# Patient Record
Sex: Male | Born: 1960
Health system: Southern US, Community
[De-identification: ages and names within clinical notes are randomized; demographics above are authoritative.]

## PROBLEM LIST (undated history)

## (undated) DIAGNOSIS — J45909 Unspecified asthma, uncomplicated: Secondary | ICD-10-CM

## (undated) DIAGNOSIS — N289 Disorder of kidney and ureter, unspecified: Secondary | ICD-10-CM

## (undated) DIAGNOSIS — J342 Deviated nasal septum: Secondary | ICD-10-CM

## (undated) DIAGNOSIS — E7800 Pure hypercholesterolemia, unspecified: Secondary | ICD-10-CM

## (undated) DIAGNOSIS — E079 Disorder of thyroid, unspecified: Secondary | ICD-10-CM

## (undated) DIAGNOSIS — C649 Malignant neoplasm of unspecified kidney, except renal pelvis: Secondary | ICD-10-CM

## (undated) DIAGNOSIS — G43909 Migraine, unspecified, not intractable, without status migrainosus: Secondary | ICD-10-CM

## (undated) DIAGNOSIS — K760 Fatty (change of) liver, not elsewhere classified: Secondary | ICD-10-CM

## (undated) DIAGNOSIS — B159 Hepatitis A without hepatic coma: Secondary | ICD-10-CM

## (undated) DIAGNOSIS — M797 Fibromyalgia: Secondary | ICD-10-CM

## (undated) DIAGNOSIS — S32000A Wedge compression fracture of unspecified lumbar vertebra, initial encounter for closed fracture: Secondary | ICD-10-CM

## (undated) DIAGNOSIS — F32A Depression, unspecified: Secondary | ICD-10-CM

## (undated) DIAGNOSIS — G4730 Sleep apnea, unspecified: Secondary | ICD-10-CM

## (undated) DIAGNOSIS — R0981 Nasal congestion: Secondary | ICD-10-CM

## (undated) DIAGNOSIS — G5600 Carpal tunnel syndrome, unspecified upper limb: Secondary | ICD-10-CM

## (undated) DIAGNOSIS — E119 Type 2 diabetes mellitus without complications: Secondary | ICD-10-CM

## (undated) DIAGNOSIS — F329 Major depressive disorder, single episode, unspecified: Secondary | ICD-10-CM

## (undated) DIAGNOSIS — E669 Obesity, unspecified: Secondary | ICD-10-CM

## (undated) DIAGNOSIS — Z85528 Personal history of other malignant neoplasm of kidney: Secondary | ICD-10-CM

## (undated) DIAGNOSIS — M1990 Unspecified osteoarthritis, unspecified site: Secondary | ICD-10-CM

## (undated) DIAGNOSIS — I10 Essential (primary) hypertension: Secondary | ICD-10-CM

## (undated) HISTORY — DX: Disorder of thyroid, unspecified: E07.9

## (undated) HISTORY — DX: Nasal congestion: R09.81

## (undated) HISTORY — DX: Depression, unspecified: F32.A

## (undated) HISTORY — DX: Disorder of kidney and ureter, unspecified: N28.9

## (undated) HISTORY — DX: Wedge compression fracture of unspecified lumbar vertebra, initial encounter for closed fracture: S32.000A

## (undated) HISTORY — DX: Deviated nasal septum: J34.2

## (undated) HISTORY — DX: Obesity, unspecified: E66.9

## (undated) HISTORY — DX: Migraine, unspecified, not intractable, without status migrainosus: G43.909

## (undated) HISTORY — DX: Type 2 diabetes mellitus without complications: E11.9

## (undated) HISTORY — PX: KNEE SURGERY: SHX244

## (undated) HISTORY — DX: Unspecified asthma, uncomplicated: J45.909

## (undated) HISTORY — DX: Morbid (severe) obesity due to excess calories: E66.01

## (undated) HISTORY — DX: Sleep apnea, unspecified: G47.30

## (undated) HISTORY — DX: Major depressive disorder, single episode, unspecified: F32.9

## (undated) HISTORY — DX: Pure hypercholesterolemia, unspecified: E78.00

## (undated) HISTORY — DX: Fatty (change of) liver, not elsewhere classified: K76.0

## (undated) HISTORY — PX: ANKLE SURGERY: SHX546

## (undated) HISTORY — DX: Fibromyalgia: M79.7

## (undated) HISTORY — PX: ROTATOR CUFF REPAIR: SHX139

## (undated) HISTORY — DX: Carpal tunnel syndrome, unspecified upper limb: G56.00

## (undated) HISTORY — PX: HAND SURGERY: SHX662

## (undated) HISTORY — DX: Personal history of other malignant neoplasm of kidney: Z85.528

## (undated) HISTORY — DX: Unspecified osteoarthritis, unspecified site: M19.90

## (undated) HISTORY — DX: Malignant neoplasm of unspecified kidney, except renal pelvis: C64.9

## (undated) HISTORY — DX: Hepatitis a without hepatic coma: B15.9

## (undated) HISTORY — DX: Essential (primary) hypertension: I10

---
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
## 2001-01-23 ENCOUNTER — Ambulatory Visit (HOSPITAL_BASED_OUTPATIENT_CLINIC_OR_DEPARTMENT_OTHER): Admission: RE | Admit: 2001-01-23 | Discharge: 2001-01-23 | Payer: Self-pay | Admitting: *Deleted

## 2001-02-27 ENCOUNTER — Ambulatory Visit (HOSPITAL_BASED_OUTPATIENT_CLINIC_OR_DEPARTMENT_OTHER): Admission: RE | Admit: 2001-02-27 | Discharge: 2001-02-27 | Payer: Self-pay | Admitting: Internal Medicine

## 2001-12-28 HISTORY — PX: NEPHRECTOMY: SHX65

## 2002-01-14 ENCOUNTER — Emergency Department (HOSPITAL_COMMUNITY): Admission: EM | Admit: 2002-01-14 | Discharge: 2002-01-14 | Payer: Self-pay | Admitting: Emergency Medicine

## 2002-01-14 ENCOUNTER — Encounter: Payer: Self-pay | Admitting: Emergency Medicine

## 2002-01-31 ENCOUNTER — Ambulatory Visit (HOSPITAL_BASED_OUTPATIENT_CLINIC_OR_DEPARTMENT_OTHER): Admission: RE | Admit: 2002-01-31 | Discharge: 2002-01-31 | Payer: Self-pay | Admitting: Orthopedic Surgery

## 2002-10-11 ENCOUNTER — Encounter: Payer: Self-pay | Admitting: *Deleted

## 2002-10-11 ENCOUNTER — Ambulatory Visit (HOSPITAL_COMMUNITY): Admission: RE | Admit: 2002-10-11 | Discharge: 2002-10-11 | Payer: Self-pay | Admitting: *Deleted

## 2002-10-13 ENCOUNTER — Encounter: Payer: Self-pay | Admitting: *Deleted

## 2002-10-13 ENCOUNTER — Ambulatory Visit (HOSPITAL_COMMUNITY): Admission: RE | Admit: 2002-10-13 | Discharge: 2002-10-13 | Payer: Self-pay | Admitting: *Deleted

## 2002-10-17 ENCOUNTER — Ambulatory Visit (HOSPITAL_COMMUNITY): Admission: RE | Admit: 2002-10-17 | Discharge: 2002-10-17 | Payer: Self-pay | Admitting: Urology

## 2002-10-17 ENCOUNTER — Encounter: Payer: Self-pay | Admitting: Urology

## 2002-10-30 ENCOUNTER — Inpatient Hospital Stay (HOSPITAL_COMMUNITY): Admission: RE | Admit: 2002-10-30 | Discharge: 2002-11-06 | Payer: Self-pay | Admitting: Urology

## 2002-11-01 ENCOUNTER — Encounter: Payer: Self-pay | Admitting: Urology

## 2002-11-02 ENCOUNTER — Encounter: Payer: Self-pay | Admitting: Urology

## 2002-11-03 ENCOUNTER — Encounter: Payer: Self-pay | Admitting: Urology

## 2002-11-04 ENCOUNTER — Encounter: Payer: Self-pay | Admitting: Urology

## 2002-11-05 ENCOUNTER — Encounter: Payer: Self-pay | Admitting: Urology

## 2002-11-06 ENCOUNTER — Encounter: Payer: Self-pay | Admitting: Urology

## 2003-02-15 ENCOUNTER — Ambulatory Visit (HOSPITAL_COMMUNITY): Admission: RE | Admit: 2003-02-15 | Discharge: 2003-02-15 | Payer: Self-pay | Admitting: Family Medicine

## 2003-02-15 ENCOUNTER — Encounter: Payer: Self-pay | Admitting: Family Medicine

## 2003-12-14 ENCOUNTER — Ambulatory Visit (HOSPITAL_COMMUNITY): Admission: RE | Admit: 2003-12-14 | Discharge: 2003-12-14 | Payer: Self-pay | Admitting: Urology

## 2005-09-30 ENCOUNTER — Inpatient Hospital Stay (HOSPITAL_COMMUNITY): Admission: RE | Admit: 2005-09-30 | Discharge: 2005-10-04 | Payer: Self-pay | Admitting: Orthopedic Surgery

## 2005-12-28 HISTORY — PX: BACK SURGERY: SHX140

## 2006-11-24 ENCOUNTER — Inpatient Hospital Stay (HOSPITAL_COMMUNITY): Admission: RE | Admit: 2006-11-24 | Discharge: 2006-11-28 | Payer: Self-pay | Admitting: Neurosurgery

## 2007-01-04 ENCOUNTER — Encounter: Admission: RE | Admit: 2007-01-04 | Discharge: 2007-01-04 | Payer: Self-pay | Admitting: Neurosurgery

## 2007-02-08 ENCOUNTER — Encounter: Admission: RE | Admit: 2007-02-08 | Discharge: 2007-02-08 | Payer: Self-pay | Admitting: Neurosurgery

## 2007-03-10 ENCOUNTER — Encounter: Admission: RE | Admit: 2007-03-10 | Discharge: 2007-03-10 | Payer: Self-pay | Admitting: Neurosurgery

## 2007-04-12 ENCOUNTER — Encounter: Admission: RE | Admit: 2007-04-12 | Discharge: 2007-04-12 | Payer: Self-pay | Admitting: Neurosurgery

## 2007-09-02 ENCOUNTER — Inpatient Hospital Stay (HOSPITAL_COMMUNITY): Admission: RE | Admit: 2007-09-02 | Discharge: 2007-09-04 | Payer: Self-pay | Admitting: Orthopedic Surgery

## 2007-09-02 ENCOUNTER — Encounter (INDEPENDENT_AMBULATORY_CARE_PROVIDER_SITE_OTHER): Payer: Self-pay | Admitting: Orthopedic Surgery

## 2009-10-22 ENCOUNTER — Ambulatory Visit (HOSPITAL_COMMUNITY): Admission: RE | Admit: 2009-10-22 | Discharge: 2009-10-22 | Payer: Self-pay | Admitting: Rheumatology

## 2009-11-25 ENCOUNTER — Encounter: Admission: RE | Admit: 2009-11-25 | Discharge: 2009-11-25 | Payer: Self-pay | Admitting: Neurology

## 2009-12-03 ENCOUNTER — Encounter: Payer: Self-pay | Admitting: Interventional Radiology

## 2009-12-18 ENCOUNTER — Ambulatory Visit (HOSPITAL_COMMUNITY): Admission: RE | Admit: 2009-12-18 | Discharge: 2009-12-18 | Payer: Self-pay | Admitting: Interventional Radiology

## 2010-11-10 ENCOUNTER — Ambulatory Visit (HOSPITAL_COMMUNITY): Admission: RE | Admit: 2010-11-10 | Discharge: 2010-11-10 | Payer: Self-pay | Admitting: Urology

## 2011-03-10 LAB — SURGICAL PCR SCREEN
MRSA, PCR: NEGATIVE
Staphylococcus aureus: POSITIVE — AB

## 2011-03-10 LAB — COMPREHENSIVE METABOLIC PANEL
Albumin: 4.2 g/dL (ref 3.5–5.2)
Alkaline Phosphatase: 107 U/L (ref 39–117)
BUN: 16 mg/dL (ref 6–23)
Creatinine, Ser: 1.2 mg/dL (ref 0.4–1.5)
Glucose, Bld: 103 mg/dL — ABNORMAL HIGH (ref 70–99)
Potassium: 4.3 mEq/L (ref 3.5–5.1)
Total Bilirubin: 0.7 mg/dL (ref 0.3–1.2)
Total Protein: 7.6 g/dL (ref 6.0–8.3)

## 2011-03-10 LAB — GLUCOSE, CAPILLARY: Glucose-Capillary: 94 mg/dL (ref 70–99)

## 2011-03-10 LAB — HEMOGLOBIN A1C
Hgb A1c MFr Bld: 7.8 % — ABNORMAL HIGH (ref <5.7)
Mean Plasma Glucose: 177 mg/dL — ABNORMAL HIGH (ref <117)

## 2011-03-30 LAB — BASIC METABOLIC PANEL
CO2: 26 mEq/L (ref 19–32)
Chloride: 103 mEq/L (ref 96–112)
GFR calc Af Amer: >60 mL/min (ref >60)
Glucose, Bld: 147 mg/dL — ABNORMAL HIGH (ref 70–99)
Sodium: 138 mEq/L (ref 135–145)

## 2011-03-30 LAB — GLUCOSE, CAPILLARY: Glucose-Capillary: 162 mg/dL — ABNORMAL HIGH (ref 70–99)

## 2011-03-30 LAB — CBC
Hemoglobin: 15.8 g/dL (ref 13.0–17.0)
MCHC: 33.6 g/dL (ref 30.0–36.0)
MCV: 88 fL (ref 78.0–100.0)
RBC: 5.34 MIL/uL (ref 4.22–5.81)
RDW: 13.8 % (ref 11.5–15.5)

## 2011-05-12 NOTE — Op Note (Signed)
NAMEJAYCE, [name]            ACCOUNT NO.:  [id number]   MEDICAL RECORD NO.:  [id number]          PATIENT TYPE:  INP   LOCATION:  2550                         FACILITY:  [facility]   PHYSICIAN:  Harvie Junior, M.D.   DATE OF BIRTH:  April 22, 1961   DATE OF PROCEDURE:  09/02/2007  DATE OF DISCHARGE:                               OPERATIVE REPORT   PREOPERATIVE DIAGNOSIS:  Painful left total knee for greater than a  year.   POSTOPERATIVE DIAGNOSIS:  Painful left total knee for greater than a  year.   PRINCIPAL PROCEDURE:  1. Radical synovectomy, left knee.  2. Total knee revision by way of tibial component, by way of poly      exchange.   SURGEON:  Harvie Junior, M.D.   ASSISTANT:  Marshia Ly, P.A.   ANESTHESIA:  General.   BRIEF HISTORY:  [name] is a 50 year old male with a long history of  having had significant left knee pain.  He had undergone total knee  replacement and had done reasonably well with that.  He was getting back  into some work, had some bad back trouble with caused him a lot of  problems and ultimately had a couple of back surgeries.  He was still  having some significant left knee pain and ultimately was concerned  about his left knee replacement, and because of continued complaints of  pain, we evaluated thoroughly.  Preoperative workup for infection was  negative.  X-rays looked to show well-fixed componentry, and ultimately  because of continued complaints of pain, we ultimately took him to the  operating room for exploration and revision as needed.   PROCEDURE:  The patient was taken to the operating room.  After adequate  anesthesia was obtained with general anesthetic and the patient was  placed on the operating room table, the left knee was prepped and draped  using sterile fashion.  Following this, the leg was exsanguinated of  blood, and tourniquet was inflated to 350 mmHg.  Following this, the old  incision was exploited, and we went  down into the knee joint.  There was  fair amount of fluid in the knee joint.  This was sent for a Gram stain  and culture.  Once that was completed, the knee was opened carefully.  We gave openings to allow for the synovectomy and carefully protected  the patellar tendon and the quad mechanism.  We did a thorough  synovectomy medially, laterally, up onto the patellofemoral joint, and  then aggressively pursued the synovial proliferation laterally,  posterolaterally, posteromedially and just resected a tremendous amount  of scar tissue out of this knee.  At this point, the tibial component  was revised by way of taking out the tibial polyethylene liner, and then  we basically exposed the tibial component, took all the bone around from  heaped up over the front edge of this and basically took some bone tamp  and tamped on the component.  There was no tendency towards it to be  loosening, and we really went all around this component to make sure  there was  no evidence of loosening.  Femoral component similarly then  went completely around the entire interface of the femoral component and  saw no loosening of the femoral component.  We put the femoral guide on  and sort of rotated and pulled and no tendency towards that to want to  come off.  At this point, the tourniquet was let down.  A thorough  lavage was performed with 3 liters of pulsatile lavage irrigation, and a  trial polyethylene was put in place.  Excellent range of motion was  achieved on the table.  Ultimately, a 10 polyethylene liner was put in  place, and the  parapatellar arthrotomy was closed with 1 Vicryl running suture and then  the skin with 0 and 2-0 Vicryl and skin staples.  A sterile compressive  dressing was applied, and the patient was taken to recovery room.  She  was noted to be in satisfactory condition.  The estimated blood loss for  the procedure was none.      Harvie Junior, M.D.  Electronically  Signed     JLG/MEDQ  D:  09/02/2007  T:  09/02/2007  Job:  [id number]   cc:   Harvie Junior, M.D.

## 2011-05-15 NOTE — Discharge Summary (Signed)
[name], [name]            ACCOUNT NO.:  [id number]   MEDICAL RECORD NO.:  [id number]          PATIENT TYPE:  INP   LOCATION:  5018                         FACILITY:  [facility]   PHYSICIAN:  Harvie Junior, M.D.   DATE OF BIRTH:  [birth year]   DATE OF ADMISSION:  09/02/2007  DATE OF DISCHARGE:  09/04/2007                               DISCHARGE SUMMARY   ADMISSION DIAGNOSES:  1. Painful left knee, status post left total knee replacement on      October 6, with possible aseptic loosening of total knee      components.  2. Diabetes mellitus.  3. Hypertension.  4. Asthma.  5. History of hepatitis A.  6. Hyperlipidemia.   DISCHARGE DIAGNOSES:  1. Painful left knee, status post left total knee replacement on      October 6, with possible aseptic loosening of total knee      components, extensive scar tissue and synovitis.  2. Diabetes mellitus.  3. Hypertension.  4. Asthma.  5. History of hepatitis A.  6. Hyperlipidemia.   PROCEDURES:  1. Radical synovectomy, left knee, by Jodi Geralds, MD, on September 02, 2007.  2. Total knee revision with polyethylene exchange of tibial component.   HISTORY OF PRESENT ILLNESS:  [name] is a 50 year old male who is  well known to [place].  He underwent a left total knee replacement in October  2006.  He did well overall initially.  He had a degenerative arthritis  at the time.  He has been followed along and in the past 6-8 months has  had worsening left knee pain with weightbearing.  Standing x-rays of the  left knee showed no frank loosening of his components, but he has  actually had persistent left knee pain for more than 1 year that did not  respond to exhaustive conservative treatment including medication,  modifications to his activity and therapy.  Based upon his clinical  findings, he was felt to be a candidate for a left knee arthrotomy with  evaluation of this total knee components and surgical intervention.  He  was  admitted for this.   PERTINENT LABORATORY STUDIES:  Hemoglobin on admission was 16.2,  hematocrit 47.1, MCV within normal limits.  On postop day #1, his  hemoglobin was 3.2.  On postop day #2, it was 13.6.  Protime on  admission was 12.9 seconds with an INR of 1.0, PTT 29.  On date of  discharge, his INR was 1.3 on Coumadin therapy.  BMET on admission  showed no abnormalities.  He did elevate his glucose up to 168 on postop  day #2.  Although there are parts of the CMET which were done prior to  admission and were within normal limits, his hemoglobin A1c was 6.9.  Urinalysis showed no abnormalities.  Cultures of the left knee including  wound and tissue cultures, direct smears and anaerobic cultures were all  negative.   HOSPITAL COURSE:  The patient underwent surgery as well described Dr.  Luiz Sanchez' operative note on September 02, 2007.  Postoperatively, he was  started on Coumadin  for DVT prophylaxis.  A CPM machine was used and a  PCA morphine pump was used for pain control.  The patient was also given  IV antibiotics after the cultures were taken and were given x1 day  postop.  Physical therapy evaluated the patient for walker ambulation,  weightbearing as tolerated on the left.  On postop day #1, he had  moderate knee pain and no fever.  He was overall stable.  His hemoglobin  was stable and physical therapy continued to work with him.  The drain  that was placed at the time of surgery was continued.  He was continued  on Coumadin for DVT prophylaxis as well.  On postop day #2, his  hemoglobin was 13.6 and BMET was within normal limits.  Vital signs were  stable and he was afebrile.  His left knee dressing was changed and  Hemovac drain was pulled.  He had no active drainage at that point.  His  neurovascular status was intact distally.  Calf was soft and nontender.  He is felt to be ready for discharge home at this point.   CONDITION ON DISCHARGE:  He was then discharged home in  improved  condition.   DIET:  Diabetic diet.   ACTIVITY:  Weightbearing as tolerated on left with a walker.  He will  use a home CPM machine at will get home health physical therapy and home  health protime and INR checks.   DISCHARGE MEDICATIONS:  1. Continue on Coumadin one daily x1 month postop for DVT prophylaxis.  2. Percocet 5 mg p.r.n. pain.  3. Zofran p.r.n. for nausea, 4 mg q.8 h.   FOLLOW UP:  Follow up Dr. Luiz Sanchez in 10 days in the office, sooner if any  problems occur.      Marshia Ly, P.A.      Harvie Junior, M.D.  Electronically Signed    JB/MEDQ  D:  10/27/2007  T:  10/28/2007  Job:  [id number]   cc:   Duwayne Heck L. Mahaffey, M.D.

## 2011-05-15 NOTE — Op Note (Signed)
[name], [name] NO.:  [id number]   MEDICAL RECORD NO.:  [id number]          PATIENT TYPE:  INP   LOCATION:  0003                         FACILITY:  [facility]   PHYSICIAN:  Harvie Junior, M.D.   DATE OF BIRTH:  1961-11-15   DATE OF PROCEDURE:  09/30/2005  DATE OF DISCHARGE:                                 OPERATIVE REPORT   PREOPERATIVE DIAGNOSIS:  End-stage degenerative joint disease, left knee.   POSTOPERATIVE DIAGNOSIS:  End-stage degenerative joint disease, left knee.   PRINCIPLE PROCEDURE:  1.  Left total knee replacement with a Sigma system, size 4 cemented femur,      size 4 cemented keel tibia, a 10 mm bridging bearing and a 38 mm all      polyethylene patella.  2.  Computer assisted total knee replacement.  3.  Removal of deep implant.   SURGEON:  Harvie Junior, M.D.   ASSISTANT:  Marshia Ly, P.A.   ANESTHESIA:  General.   BRIEF HISTORY:  Mr. Brideau is a 50 year old male with a long history of  having had an ACL reconstruction some 20+ years ago.  He had a secondary  procedure following that.  Ultimately, he has had pain over the last 10  years.  We had followed him in the office for many years with continued knee  pain.  We had treated him with anti-inflammatory medications, injection  therapy, activity modification.  He had not done well with any of it but had  been trying to hold off of knee replacement for a long period of time and  ultimately just was not able to continue with the level of pain he was  having, and he presented for knee replacement.   PROCEDURE:  Patient was taken to the operating room.  After adequate  anesthesia was obtained with general anesthetic, the patient was brought to  the operating room table.  The left leg was prepped and draped in the usual  sterile fashion.  Following this, a curved incision was made through the old  long medial incision that he had, and this was curved medially at the  proximal distal  limb.  Subcutaneous tissues were dissected down to the level  of the extensor mechanism, and a medial parapatellar arthrotomy was  undertaken with the medial one third of the quadriceps tendon.  A large  medial release was performed on the entrance, given the large varus  deformity preoperatively.  At this point, a medial and lateral meniscectomy  was performed.  Anterior and posterior cruciates were excised.  The  retropatellar fat pad was excised, and a medial synovectomy was undertaken  because of the large amount of synovitis that was present within the knee.  At this point in time, osteophytes were debrided, and the case was stopped  at this point.  The computer assistance guide pins were placed.  Two medial  pins were placed deep in the wound.  The two femoral pins were placed deep  into the wound.  At this point, attention was turned to a large bone staple  which was anterior.  This had  been part of his initial ACL reconstruction.  This significant amount of excision of bone needed to be undertaken, and  this was excised without undue effect.  At this point, attention was turned  back to the computer-assistance screen, where the overall long alignment was  now checked.  He was in about 3 degrees of varus still but could easily be  manually corrected to neutral.  At this point, the registration was  undertaken to allow the computer to make models of the femur and tibia and  assess what the overall alignment ligamentous balance.  Once this  registration had been undertaken, the tibial cut was made per the computer  screen.  Attention was then turned towards the ligament balancing, which was  undertaken, and the planning screen was undertaken.  We did need to downsize  the femur from a size 5 to a size 4 to allow for flexion balance, and I  think this was really something that we would not have been able to achieve  without the computer screen.  At this point, the size 4 was chosen as  the  final template, and the distal femoral cut was made.  The blocks were put in  place at the test of this mechanism of the inflection.  The size 4's were  then cut, and the flexion gap and extension gap and the balance.  The  computer was used, and the gaps were balanced perfectly, medial and  laterally as well as the overall total balance.  At this point, the box cut  was made.  The attention was then turned to the tibial side, where the drill  for the keel was done as well as the central portion and the tibial trial  component was put in place.  The attention was then turned to the femoral  side where the femoral component was put in place, and the 10 mm  polyethylene was then put in place.  The knee was reduced at this point.  Computer assistance was used at this point.  The knee came easily into full  extension.  The knee was in perfect neutral alignment overall.  The  attention at this time was turned to the patella.  The patella was then cut  down to a level of 13 mm, and a 38 mm poly patella was then placed.  It was  drilled, and the patella was then placed.  Following this, the knee was put  through a range of motion.  The patella was perfectly midline.  The trial  components were then removed.  The knee was launched thoroughly and then  dried.  The final components were cemented into place.  A size 4 femur, a  size 4 tibia, a 10 mm bridging bearing.  A trial was used initially to  squeeze excess cement out, and the posterior cement was removed.  Then a  final polyethylene was put in place.  The attention was then turned towards  the patella, where the patella was cemented into place.  The cement was  allowed to harden while a medium Hemovac drain was put in place.  A final  look at the computer was undertaken.  Perfect long alignment.  The gaps were  balanced perfectly in extension and perfectly in flexion.  At this point, the computer was removed.  The medium Hemovac drain was  put in place.  The  median parapatellar arthrotomy was closed with a #1 Vicryl running, locking  suture.  At this  point, the medial parapatellar arthrotomy was closed with a  #1 Vicryl running suture, and a final look was made at this point.  Easy  gravity flexion of 130 degrees.  No medial or lateral instability.  Easy  full extension.  After the parapatellar arthrotomy was closed, the skin was  closed with 0 and 2-0 Vicryl and the skin was closed with staples.  Sterile  compressive dressing was applied as well as a knee immobilizer.  The patient  was taken to the recovery room.  She is noted to be in satisfactory  condition.  Estimated blood loss for the procedure was none.      Harvie Junior, M.D.  Electronically Signed     JLG/MEDQ  D:  09/30/2005  T:  09/30/2005  Job:  [id number]

## 2011-05-15 NOTE — Discharge Summary (Signed)
[name], EMBLETON NO.:  [id number]   MEDICAL RECORD NO.:  [id number]          PATIENT TYPE:  INP   LOCATION:  3031                         FACILITY:  [facility]   PHYSICIAN:  Donalee Citrin, M.D.        DATE OF BIRTH:  Nov 30, 1961   DATE OF ADMISSION:  11/24/2006  DATE OF DISCHARGE:  11/28/2006                               DISCHARGE SUMMARY   ADMISSION DIAGNOSIS:  Grade 1 spondylolisthesis, L5/S1 with degenerative  disk disease in H and P and L4/L5.   PROCEDURE:  Decompression lumbar laminectomy at L4/L5 and L5/S1 with  posterior lumbar interbody  fusion at L4/L5 and L5/S1.   POSTOPERATIVE/FINAL DIAGNOSIS:  Grade 1 spondylolisthesis, L5/S1 with  degenerative disk disease in H and P and L4/L5.   HISTORY OF PRESENT ILLNESS:  The patient was admitted as an EMA with the  operating window.  We performed the procedure and postop the patient did  very well, went to the recovery room and then the floor.  On the floor  the patient had complete resolution of his preoperative leg pain.  He  was ambulating and voiding spontaneously on day #1.  His drain was able  to be taken out on day #2.  He had a lot of problems with an ileus and  postoperative nausea that progressively improved over the next few days  with manipulations of some of his medications.  He seemed to tolerate  his pain medication and was much improved.  He was able to be discharged  home on hospital day #4.   DISPOSITION:  He has scheduled follow up in 2 weeks with neurosurgery  service.           ______________________________  Donalee Citrin, M.D.     GC/MEDQ  D:  03/04/2007  T:  03/04/2007  Job:  [id number]

## 2011-05-15 NOTE — Op Note (Signed)
[name], [name]            ACCOUNT NO.:  [id number]   MEDICAL RECORD NO.:  [id number]          PATIENT TYPE:  INP   LOCATION:  2899                         FACILITY:  [facility]   PHYSICIAN:  Donalee Citrin, M.D.        DATE OF BIRTH:  Apr 22, 1961   DATE OF PROCEDURE:  11/24/2006  DATE OF DISCHARGE:                               OPERATIVE REPORT   PREOPERATIVE DIAGNOSIS:  1. Grade 1 spondylolisthesis L5-S1 with bilateral pars defects at L5.  2. Severe spinal stenosis L4-L5 with large central disc herniation L4-      L5.   POSTOPERATIVE DIAGNOSIS:  1. Grade 1 spondylolisthesis L5-S1 with bilateral pars defects at L5.  2. Severe spinal stenosis L4-L5 with large central disc herniation L4-      L5.   PROCEDURE:  1. Gill decompression L5-S1.  2. Decompressive laminectomy L4-L5 in excess of what would be needed      by a standard interbody fusion.  3. Posterior lumbar interbody fusion L4-L5 and L5-S1 using hybrid      Telamon PEEK cages packed with BMX bone substitute and Tangent      allograft wedges, pedicle screw fixation L4 to S1 using the 6.35      Legacy pedicle screw system, open reduction of spinal deformity L5-      S1, placement of Hemovac drain, posterolateral arthrodesis L4 to S1      using locally harvested autograft mixed with DBX bone substitute.   SURGEON:  Donalee Citrin, M.D.   ASSISTANT:  Tia Alert, M.D.   ANESTHESIA:  General endotracheal anesthesia.   HISTORY OF PRESENT ILLNESS:  The patient is a very pleasant 50 year old  gentleman who has had long standing back and bilateral leg pain  radiating down the posterior thigh down to his heel with numbness and  tingling in his feet that has been refractory to all forms of  conservative treatment including physical therapy, epidural steroid  injections, bracing, anti-inflammatories and time.  The patient's  preoperative imaging showed grade 1 spondylolisthesis causing severe  spinal stenosis by foraminal stenosis  of the L5 nerve roots at L5-S1 as  well as severe spinal stenosis at L4-L5 due to predominately facet  arthropathy and a large central disc herniation causing severe spinal  stenosis at L4-L5.  Due to the patient's failure with conservative  treatment, MRI findings and clinical exam, the patient was recommended  decompressive laminectomy and fusion.  The risks and benefits of the  operation were explained to the patient who understood and agreed to  proceed forth.   DESCRIPTION OF PROCEDURE:  The patient was brought to the OR and was  induced under general anesthesia.  He was positioned prone on the Wilson  frame.  The back was prepped and draped in a routine sterile fashion.  Preoperative x-ray localized the L5 spinous process, so after  infiltration of 10 mL of lidocaine with epinephrine, a midline incision  was made and Bovie electrocautery was used to take down the subcutaneous  tissue and subperiosteal dissection was carried to the lamina of L4, L5  and S1 bilaterally.  TPs were then exposed and an Interoperative x-ray  confirmed localization of the TP of L5.  So, after all pedicles and TPs  had been exposed from L4, L5, and S1, a self-retaining retractor was  placed.  The spinal laminar complex at L5 was noted to be markedly  hypermobile from the bilateral pars defects which were immediately  appreciated.  So then the spinous process of L5 and spinous process of  L4 were then removed.  The medial facet complexes were drilled down with  a high speed drill at both levels and then complete central  decompression was performed at L4 up to the inferior aspect of the L3  lamina and disc space.  The L4 nerve root was identified and  skeletonized out its foramen. Complete medial facetectomies were  performed at L4 identifying the proximal mass of the L5 nerve root and  the lateral interspace was also exposed under biting the lateral facet  complex at L4-L5.  Then, the decompression was  continued down through  L5. Again, the bilateral pars defects caused hypermobility of the medial  facet complex which was removed en bloc decompressing the proximal L5  and S1 nerve roots.  The L5 nerve roots bilaterally were noted to be  markedly compressed due to the slip underneath the pedicle and facet  complex at L5.  This was all under bitten, the scar tissue and  inflammatory response of the ligament was dissected off of the L5 nerve  root.  Then, using a 2 and 3 mm Kerrison punch very carefully, the L5  neural foramen was decompressed marching out laterally.  After the L5  nerve roots were unroofed, the interspace was identified.  The epidural  veins were coagulated.  The S1 nerve was identified flush with the S1  pedicle and the partial superior aspect of the S1 lamina was removed,  decompressing the S1 nerve root, the S1 foramen, and identifying the  lateral interspace at L5-S1. After decompression had been completed and  all six neural foramina were patent, attention was taken to the  interbody work.  A Duroco was used to reflect the right S1 nerve root  medially.  Annulotomy was done with an 11 blade scalpel and noted to be  distally markedly collapsed with a large posterior osteophyte coming off  the endplate of S1.  This was bitten off with a 2 and 3 mm Kerrison  punch to gain access to the disc space.  The disc space was noted to be  right underneath the L5 nerve root, so this was done with care. A 7 mm  distractor was inserted in the interspace opening up.  Then on the  patient's left side, the left S1 nerve roots were reflected medially and  annulotomy was made in a similar fashion.  The S1 endplate was bitten  off with a 2 mm Kerrison punch and a size 8 distractor was inserted  here.  This went up to 9 on the patient's right side.  This immediately  reduced about 50% of the deformity of L5-S1 and gave [place] access to this space safely from underneath the L5 root.  On the  left side, the  interspace was scraped with a size 8 cutter and chisel, the endplates  were scraped down with Epstein curets, central disc herniation was  removed with a downgoing Epstein curet and then a Telamon cage, 8 x 22  mm, packed with locally harvested graft mixed with DBX bone substitute,  starting on  the left side with the retractor reflecting both the L5  nerve root superiorly and the S1 nerve root medially.  After this cage  was inserted and fluoroscopy confirmed good position, attention was  turned to the right side.  The distractor was removed.  The endplates  were prepared in a similar fashion.  However, the chisel was not able to  be placed in the interspace as the L5 nerve root on the right was  overlying the interspace even more than the left side. So, it was felt  this would be better to be scraped with a round scraper and not to  attempt to try to fit chisel in there.  With a Penfield reflecting the  L5 nerve root superiorly, the Tangent allograft was able to be inserted  on the right side after locally harvested bone graft mixed with DBX bone  substitute was packed against the left sided Telamon cage.  After all  the interbody work had been done, the foramina were opened up from the  reduction and noted be widely patent and the grafts were confirmed in  good position by fluoroscopy.  Then, attention was turned to L4-L5.  This was done in a similar fashion.  However, the L4-L5 interspace was  stepped up to a size 11 distractor with 10 instruments used to clean out  the disc space and size 10 Telamon and a size 10 x 26 Tangent wedge was  inserted.  The Telamon was inserted on the right side, the Tangent was  inserted on the left side.  On the left side, the Tangent was not able  to be hammered in ventrally enough, so the posterior aspect of the  Tangent was drilled down 1 mm deep to the posterior vertebral body line  confirmed complete decompression of the central canal.   Again, locally  harvested graft was packed in the center of this disc space and after  all interbody work had been done and fluoroscopy confirmed good position  of the bone graft, attention was taken to screw placement.  A pilot hole  was drilled with a high speed drill at L4 on the left, cannulated with  the awl, probed, tapped with 5/5 tap, probed again, and a 6 by 45 screw  inserted at L4 on the left, each step along the way, fluoroscopy  confirmed good trajectory and position and the pedicle was probed from  within the pedicle as well as in the canal to confirm the medial and  lateral bridge.  This procedure was repeated at L5 and S1 on the left  with a 6 by 40 at L5 and a 6.5 by 35 on S1, and on the right with a 6 by  45 at L4 and 6 by 40 at L5, and 6.5 by 35 at S1.  After all six screws  had been placed and noted to be in good position, the wound was  copiously irrigated and hemostasis was maintained.  Aggressive decortication was carried out lateral to the TPs and gutters. Then  locally harvested autograft was laid in the lateral gutters along the  TPs and lateral facet complexes.  Then, 50-mm rods were inserted,  tightened down at S1, the L5 pedicle screws were compressed against S1,  and the L4 compressed against L5. There was no room for a crosslink.  All foramina were then re-explored with a hockey stick and coronary  dilator and noted to be widely patent.  Gelfoam was overlaid on top of  the dura.  Postop fluoroscopy confirmed good position of the rod,  screws, and bone graft.  A medium Hemovac drain was placed and the wound  was closed in layers with interrupted Vicryl with a running 4-0  subcuticular in the skin.  Benzoin and Steri-Strips were applied.  The  patient was taken to the recovery room in stable condition.  At the end  of the case, instrument, needle, and sponge counts were correct.           ______________________________  Donalee Citrin, M.D.     GC/MEDQ  D:   11/24/2006  T:  11/24/2006  Job:  [id number]

## 2011-05-15 NOTE — Discharge Summary (Signed)
NAMEZIYON, SOLTAU NO.:  [id number]   MEDICAL RECORD NO.:  [id number]          PATIENT TYPE:  INP   LOCATION:  3031                         FACILITY:  [facility]   PHYSICIAN:  Donalee Citrin, M.D.        DATE OF BIRTH:  Oct 14, 1961   DATE OF ADMISSION:  11/24/2006  DATE OF DISCHARGE:  11/28/2006                               DISCHARGE SUMMARY   ADMISSION DIAGNOSIS:  Grade I spondylolisthesis L5-S1, severe  degenerative disc disease with a large central disc herniation in the  lumbar spinous process of L4-5.   PROCEDURE:  Gill decompression L5-S1 with decompression laminectomy L4-  5, posterior lumbar interbody fusion L4-S1 with pedicle screw fixation.   HOSPITAL COURSE:  The patient is a very pleasant 50 year old gentleman  with longstanding back and bilateral leg pain who was admitted to the  hospital and he underwent the aforementioned procedure.  Postop the  patient did very well.  Was transferred to the floor.  On the floor the  patient was afebrile with complete resolution of his preoperative leg  pain.  He was progressively mobilized over the next 24-48 hours with  physical and occupational therapy.  The patient's Hemovac was able to be  taken out on day two.  His Foley was taken out on day one.  The patient  continued to do fairly well and by hospital day 5 the patient was  afebrile, ambulating and voiding.  He had a small ileus immediately  postop and nausea was progressively better.  He was passing gas, his  bowels were moving, he was voiding spontaneously and he was able to be  discharged home.   At the time of discharge the patient was sent home on Percocet and  Valium for pain and muscle relaxation and scheduled for follow up in 2  weeks.           ______________________________  Donalee Citrin, M.D.     GC/MEDQ  D:  02/11/2007  T:  02/11/2007  Job:  [id number]

## 2011-05-15 NOTE — Discharge Summary (Signed)
NAMEFINLAY, [name] NO.:  [id number]   MEDICAL RECORD NO.:  [id number]          PATIENT TYPE:  INP   LOCATION:  1508                         FACILITY:  [facility]   PHYSICIAN:  Harvie Junior, M.D.   DATE OF BIRTH:  [birth year]   DATE OF ADMISSION:  09/30/2005  DATE OF DISCHARGE:  10/04/2005                                 DISCHARGE SUMMARY   ADMISSION DIAGNOSES:  1.  End-stage degenerative joint disease, left knee.  2.  Retained hardware, left knee.  3.  Type 2 diabetes mellitus.  4.  Asthma.  5.  Hypertension.   DISCHARGE DIAGNOSES:  1.  End-stage degenerative joint disease, left knee.  2.  Retained hardware, left knee.  3.  Type 2 diabetes mellitus.  4.  Asthma.  5.  Hypertension.   PROCEDURE:  Left total knee arthroplasty, computer assisted, Harvie Junior,  M.D., September 30, 2005.   BRIEF HISTORY:  [name] is a 50 year old male who has had multiple  left knee surgeries and injuries in the past. He had an ACL reconstruction  many years ago when he was in the [employer] and presented to our office with  left knee pain and swelling, night pain and pain with ambulation. Standing x-  rays of the left knee showed that he had bone on bone arthritis with a  retained stable anteriorly. He had a cortisone injection which gave him only  very temporary relief. He ad modification of his activity and use of  medication. Despite all these nonoperative treatments, he got no relief.  Based upon his clinical and radiographic findings, he was admitted for a  left total knee arthroplasty along with hardware removal.   PERTINENT LABORATORY STUDIES:  Hemoglobin on admission was 16.9, WBC 10.6,  hematocrit 51.4. Indices within normal limits. On postoperative day #1,  hemoglobin was 13.2, #2, 12.8, #3, 13.0. Pro time on admission was 12.9  seconds  with an INR of 1.0, PTT was 29 on the date of discharge. On  Coumadin therapy, his INR was 2.4 with a pro time of 26.2.  CMET on admission  showed a slightly elevated glucose at 123, a slightly elevated ALT at 43 and  slightly elevated ALP at 121, otherwise within normal limits. BMET on  postoperative day #1 was within normal limits other than slightly decreased  sodium at 132 and then found to be 131 and then subsequently 133 on October 03, 2005. Urinalysis on admission showed no abnormalities.   HOSPITAL COURSE:  The patient underwent a computer assisted left total knee  replacement and hardware removal of the left knee as was described in Dr.  Luiz Blare' operative note. Postoperatively, he was put on a PCA morphine pump  for pain control. His usual home medications will be given. He was given 1  gram of Ancef IV q.8 h x5 doses. Physical therapy was ordered was walker  ambulation, weightbearing as tolerated on the left. A CPM was used for knee  range of motion and he was started on Coumadin __________ therapy per  pharmacy protocol. On postoperative day #1, he had moderate knee  pain. He  had some slight numbness and tingling in his foot which was all spontaneous.  His vital signs were stable, he was afebrile, his left knee dressing was  clean and dry. His hemoglobin was 13.9, his BMET was within normal limits,  the INR was 1.1. He was continued on IV fluids, got out of bed to the chair.  On postoperative day #2, he had complaints of knee pain, he was taking  fluids and voiding without difficulty. His fever was up to 100.4, his vital  signs were stable, his hemoglobin was 12.8, his INR was 1.4. His dressing  was changed and his hemovac drain was pulled. His IV was discontinued,  physical therapy was continued. He made progress with physical therapy and  had decreased pain. On postoperative day #3, he spiked a temperature up to  100.4. On postoperative day #4, the patient was complaining of moderate  pain, he was ambulating in the hallway with a walker. He was afebrile, his  vital signs were stable and his left  knee wound was benign. His INR was 2.4,  his physical therapy goals had been met and he was discharged home. He will  continue on his previous home medications with the addition of Tylox p.r.n.  for pain 1-2 q.6 h p.r.n. pain, Coumadin per home health x1 month postop  shooting for an INR of 2.0. He will get a home health RN, home health PT and  home CPM machine. His dressing will need to be changed everyday or pain He  will ambulate weightbearing as tolerated on the left with a walker and he  will be on a diabetic regular diet. He will followup with Dr. Luiz Blare in 10  days, sooner is any problems occur.      Marshia Ly, P.A.      Harvie Junior, M.D.  Electronically Signed    JB/MEDQ  D:  12/15/2005  T:  12/17/2005  Job:  [id number]   cc:   Duwayne Heck L. Mahaffey, M.D.  Fax: [phone]   Bertram Millard. Dahlstedt, M.D.  Fax: [phone]

## 2011-05-15 NOTE — Discharge Summary (Signed)
[name], CATHEY            ACCOUNT NO.:  [id number]   MEDICAL RECORD NO.:  [id number]          PATIENT TYPE:  INP   LOCATION:  3031                         FACILITY:  [facility]   PHYSICIAN:  Donalee Citrin, M.D.        DATE OF BIRTH:  [birth year]   DATE OF ADMISSION:  11/24/2006  DATE OF DISCHARGE:  11/28/2006                               DISCHARGE SUMMARY   ADMITTING DIAGNOSES:  1. Grade I spondylolisthesis, L5-S1.  2. Severe lumbar spinal stenosis at L5-S1 as well as L4-5 with central      disk herniation at L4-5 and bilateral L4, L5 and S1      radiculopathies.   PROCEDURE:  Decompressive lumbar laminectomy at L4-5, L5-S1, with  posterior lumbar interbody fusion procedure, L4-5, L5-S1.   SURGEON:  Donalee Citrin, MD   ASSISTANT:  Tia Alert, MD   HOSPITAL COURSE:  The patient is a very pleasant 50 year old gentleman  who was admitted __________ and went to the operating room and underwent  the aforementioned procedure.  Postop, the patient did very well and I  covered him on the floor.  The patient had complete resolution of his  preoperative leg pain.  His back was very sore, but was well-controlled  on IV PCA.  The patient was progressed from PCA to intermittent IV  injections with oral medicine and muscle relaxers.  Physical and  Occupational Therapy worked with the patient and progressively mobilized  over the next 24-48 hours.  The patient did very well.  The patient did  have some difficulty with postoperative nausea; however, he did have  bowel sounds, was tolerating p.o., had no episodes of vomiting.  Over  the next couple of days, the patient's nausea resolved and by hospital  day #5, the patient was stable and able to be discharged home.   DISCHARGE MEDICATIONS:  He was discharged on oral pain medicine and  muscle relaxers as well as antiemetics.   FOLLOWUP:  Scheduled for followup in 2 weeks.           ______________________________  Donalee Citrin, M.D.     GC/MEDQ  D:  01/07/2007  T:  01/07/2007  Job:  [id number]

## 2011-10-09 LAB — BASIC METABOLIC PANEL
BUN: 16
CO2: 29
Calcium: 8.9
Creatinine, Ser: 1.28
Glucose, Bld: 168 — ABNORMAL HIGH

## 2011-10-09 LAB — COMPREHENSIVE METABOLIC PANEL
ALT: 38
Alkaline Phosphatase: 106
BUN: 18
CO2: 26
Chloride: 103
Glucose, Bld: 87
Potassium: 4.2
Sodium: 138
Total Bilirubin: 0.9
Total Protein: 7.8

## 2011-10-09 LAB — CBC
HCT: 40
HCT: 47.1
Hemoglobin: 13.2
Hemoglobin: 16.2
MCHC: 34.6
Platelets: 273
RBC: 5.65
RDW: 13.5
RDW: 13.5
RDW: 13.6
WBC: 9

## 2011-10-09 LAB — URINALYSIS, ROUTINE W REFLEX MICROSCOPIC
Bilirubin Urine: NEGATIVE
Glucose, UA: NEGATIVE
Nitrite: NEGATIVE
Specific Gravity, Urine: 1.021
pH: 5.5

## 2011-10-09 LAB — GRAM STAIN

## 2011-10-09 LAB — PROTIME-INR
INR: 1
INR: 1
Prothrombin Time: 13.7

## 2011-10-09 LAB — DIFFERENTIAL
Basophils Absolute: 0.2 — ABNORMAL HIGH
Basophils Relative: 2 — ABNORMAL HIGH
Eosinophils Absolute: 0.2
Monocytes Relative: 10
Neutrophils Relative %: 54

## 2011-10-09 LAB — ANAEROBIC CULTURE

## 2011-10-09 LAB — WOUND CULTURE

## 2011-10-09 LAB — TYPE AND SCREEN

## 2012-04-15 ENCOUNTER — Other Ambulatory Visit: Payer: Self-pay | Admitting: Orthopaedic Surgery

## 2012-04-15 ENCOUNTER — Other Ambulatory Visit (HOSPITAL_COMMUNITY): Payer: Self-pay | Admitting: Orthopaedic Surgery

## 2012-04-15 DIAGNOSIS — M25562 Pain in left knee: Secondary | ICD-10-CM

## 2012-04-15 DIAGNOSIS — M25511 Pain in right shoulder: Secondary | ICD-10-CM

## 2012-04-21 ENCOUNTER — Ambulatory Visit
Admission: RE | Admit: 2012-04-21 | Discharge: 2012-04-21 | Disposition: A | Discharge status: Home / Self Care | Payer: Medicare Other | Source: Ambulatory Visit | Attending: Orthopaedic Surgery | Admitting: Orthopaedic Surgery

## 2012-04-21 DIAGNOSIS — M25511 Pain in right shoulder: Secondary | ICD-10-CM

## 2012-04-25 ENCOUNTER — Encounter (HOSPITAL_COMMUNITY)
Admission: RE | Admit: 2012-04-25 | Discharge: 2012-04-25 | Disposition: A | Discharge status: Home / Self Care | Payer: Medicare Other | Source: Ambulatory Visit | Attending: Orthopaedic Surgery | Admitting: Orthopaedic Surgery

## 2012-04-25 DIAGNOSIS — M25569 Pain in unspecified knee: Secondary | ICD-10-CM | POA: Insufficient documentation

## 2012-04-25 DIAGNOSIS — M25562 Pain in left knee: Secondary | ICD-10-CM

## 2012-04-25 DIAGNOSIS — Z96659 Presence of unspecified artificial knee joint: Secondary | ICD-10-CM | POA: Insufficient documentation

## 2012-04-25 MED ORDER — TECHNETIUM TC 99M MEDRONATE IV KIT: 25.0000 | PACK | Freq: Once | INTRAVENOUS | Status: AC | PRN

## 2012-11-21 DIAGNOSIS — Z96659 Presence of unspecified artificial knee joint: Secondary | ICD-10-CM | POA: Insufficient documentation

## 2012-11-21 DIAGNOSIS — M1710 Unilateral primary osteoarthritis, unspecified knee: Secondary | ICD-10-CM | POA: Insufficient documentation

## 2013-05-05 ENCOUNTER — Ambulatory Visit (HOSPITAL_COMMUNITY)
Admission: RE | Admit: 2013-05-05 | Discharge: 2013-05-05 | Disposition: A | Discharge status: Home / Self Care | Payer: Medicare Other | Source: Ambulatory Visit | Attending: Adult Health | Admitting: Adult Health

## 2013-05-05 ENCOUNTER — Other Ambulatory Visit (HOSPITAL_COMMUNITY): Payer: Self-pay | Admitting: Adult Health

## 2013-05-05 DIAGNOSIS — Z87891 Personal history of nicotine dependence: Secondary | ICD-10-CM | POA: Insufficient documentation

## 2013-05-05 DIAGNOSIS — C649 Malignant neoplasm of unspecified kidney, except renal pelvis: Secondary | ICD-10-CM

## 2013-05-05 DIAGNOSIS — J45909 Unspecified asthma, uncomplicated: Secondary | ICD-10-CM | POA: Insufficient documentation

## 2013-05-05 DIAGNOSIS — I10 Essential (primary) hypertension: Secondary | ICD-10-CM | POA: Insufficient documentation

## 2013-05-05 DIAGNOSIS — E119 Type 2 diabetes mellitus without complications: Secondary | ICD-10-CM | POA: Insufficient documentation

## 2013-05-05 DIAGNOSIS — R0602 Shortness of breath: Secondary | ICD-10-CM | POA: Insufficient documentation

## 2013-05-05 DIAGNOSIS — M8448XA Pathological fracture, other site, initial encounter for fracture: Secondary | ICD-10-CM | POA: Insufficient documentation

## 2013-12-28 HISTORY — PX: COLONOSCOPY WITH ESOPHAGOGASTRODUODENOSCOPY (EGD): SHX5779

## 2014-07-24 ENCOUNTER — Encounter: Payer: Self-pay | Admitting: Internal Medicine

## 2015-05-30 ENCOUNTER — Telehealth: Payer: Self-pay

## 2015-05-30 NOTE — Telephone Encounter (Signed)
LMOVM

## 2015-05-31 ENCOUNTER — Ambulatory Visit: Payer: Self-pay | Admitting: Family Medicine

## 2015-11-08 ENCOUNTER — Ambulatory Visit (INDEPENDENT_AMBULATORY_CARE_PROVIDER_SITE_OTHER)
Admission: RE | Admit: 2015-11-08 | Discharge: 2015-11-08 | Disposition: A | Discharge status: Home / Self Care | Payer: Medicare Other | Source: Ambulatory Visit | Attending: Pulmonary Disease | Admitting: Pulmonary Disease

## 2015-11-08 ENCOUNTER — Ambulatory Visit (INDEPENDENT_AMBULATORY_CARE_PROVIDER_SITE_OTHER): Payer: Medicare Other | Admitting: Pulmonary Disease

## 2015-11-08 ENCOUNTER — Encounter: Payer: Self-pay | Admitting: Pulmonary Disease

## 2015-11-08 VITALS — BP 122/74 | HR 82 | Temp 98.3°F | Ht 65.5 in | Wt 225.0 lb

## 2015-11-08 DIAGNOSIS — I10 Essential (primary) hypertension: Secondary | ICD-10-CM

## 2015-11-08 DIAGNOSIS — J45909 Unspecified asthma, uncomplicated: Secondary | ICD-10-CM | POA: Insufficient documentation

## 2015-11-08 DIAGNOSIS — E119 Type 2 diabetes mellitus without complications: Secondary | ICD-10-CM

## 2015-11-08 DIAGNOSIS — J4520 Mild intermittent asthma, uncomplicated: Secondary | ICD-10-CM

## 2015-11-08 DIAGNOSIS — E7800 Pure hypercholesterolemia, unspecified: Secondary | ICD-10-CM

## 2015-11-08 DIAGNOSIS — J209 Acute bronchitis, unspecified: Secondary | ICD-10-CM

## 2015-11-08 DIAGNOSIS — M159 Polyosteoarthritis, unspecified: Secondary | ICD-10-CM

## 2015-11-08 DIAGNOSIS — Z794 Long term (current) use of insulin: Secondary | ICD-10-CM

## 2015-11-08 DIAGNOSIS — E1149 Type 2 diabetes mellitus with other diabetic neurological complication: Secondary | ICD-10-CM | POA: Insufficient documentation

## 2015-11-08 DIAGNOSIS — R05 Cough: Secondary | ICD-10-CM | POA: Diagnosis not present

## 2015-11-08 DIAGNOSIS — G4733 Obstructive sleep apnea (adult) (pediatric): Secondary | ICD-10-CM

## 2015-11-08 DIAGNOSIS — J4540 Moderate persistent asthma, uncomplicated: Secondary | ICD-10-CM | POA: Insufficient documentation

## 2015-11-08 DIAGNOSIS — R0602 Shortness of breath: Secondary | ICD-10-CM

## 2015-11-08 DIAGNOSIS — R059 Cough, unspecified: Secondary | ICD-10-CM

## 2015-11-08 DIAGNOSIS — N289 Disorder of kidney and ureter, unspecified: Secondary | ICD-10-CM

## 2015-11-08 DIAGNOSIS — M150 Primary generalized (osteo)arthritis: Secondary | ICD-10-CM

## 2015-11-08 DIAGNOSIS — M1990 Unspecified osteoarthritis, unspecified site: Secondary | ICD-10-CM

## 2015-11-08 DIAGNOSIS — N189 Chronic kidney disease, unspecified: Secondary | ICD-10-CM

## 2015-11-08 MED ORDER — AMOXICILLIN-POT CLAVULANATE 875-125 MG PO TABS: 1.0000 | ORAL_TABLET | Freq: Two times a day (BID) | ORAL | Status: DC

## 2015-11-08 NOTE — Patient Instructions (Signed)
[name]-- it was great meeting you today!  We checked a CXR (clear- no pneumonia) & Spirometry (WNL)...  For your acute bronchitis we are prescribing AUGMENTIN 875mg  one tab twice daily til gone...  Be sure to take an OTC PROBIOTICV like ALIGN while you are on the antibiotic Rx...  Add-in MUCINEX 600mg - one to two tabs twice daily w/ lots of fluids....  For your SINUSES>    Try the OTC FLONASE spray- 2sprays in each nostril twice daily...    And the OTC SALINE nasal mist every 1-2 hours     Hopefully you will be able to back off on the AFRIN spray...  Call for any questions or if I can be of service in any way.Marland KitchenMarland Kitchen
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
## 2015-12-05 LAB — HM COLONOSCOPY

## 2015-12-09 ENCOUNTER — Encounter: Payer: Self-pay | Admitting: Behavioral Health

## 2015-12-09 ENCOUNTER — Telehealth: Payer: Self-pay | Admitting: Behavioral Health

## 2015-12-09 NOTE — Telephone Encounter (Signed)
Pre-Visit Call completed with patient and chart updated.   Pre-Visit Info documented in Specialty Comments under [employer].    

## 2015-12-10 ENCOUNTER — Encounter: Payer: Self-pay | Admitting: Family

## 2015-12-10 ENCOUNTER — Ambulatory Visit (INDEPENDENT_AMBULATORY_CARE_PROVIDER_SITE_OTHER): Payer: Medicare Other | Admitting: Family

## 2015-12-10 VITALS — BP 128/90 | HR 77 | Temp 98.3°F | Resp 16 | Ht 65.0 in | Wt 227.4 lb

## 2015-12-10 DIAGNOSIS — E1122 Type 2 diabetes mellitus with diabetic chronic kidney disease: Secondary | ICD-10-CM

## 2015-12-10 DIAGNOSIS — E785 Hyperlipidemia, unspecified: Secondary | ICD-10-CM

## 2015-12-10 DIAGNOSIS — N400 Enlarged prostate without lower urinary tract symptoms: Secondary | ICD-10-CM

## 2015-12-10 DIAGNOSIS — Z23 Encounter for immunization: Secondary | ICD-10-CM | POA: Diagnosis not present

## 2015-12-10 DIAGNOSIS — F329 Major depressive disorder, single episode, unspecified: Secondary | ICD-10-CM

## 2015-12-10 DIAGNOSIS — N289 Disorder of kidney and ureter, unspecified: Secondary | ICD-10-CM

## 2015-12-10 DIAGNOSIS — E119 Type 2 diabetes mellitus without complications: Secondary | ICD-10-CM

## 2015-12-10 DIAGNOSIS — N529 Male erectile dysfunction, unspecified: Secondary | ICD-10-CM | POA: Insufficient documentation

## 2015-12-10 DIAGNOSIS — F32A Depression, unspecified: Secondary | ICD-10-CM | POA: Insufficient documentation

## 2015-12-10 DIAGNOSIS — K219 Gastro-esophageal reflux disease without esophagitis: Secondary | ICD-10-CM

## 2015-12-10 DIAGNOSIS — Z85528 Personal history of other malignant neoplasm of kidney: Secondary | ICD-10-CM | POA: Insufficient documentation

## 2015-12-10 DIAGNOSIS — E7800 Pure hypercholesterolemia, unspecified: Secondary | ICD-10-CM

## 2015-12-10 DIAGNOSIS — E291 Testicular hypofunction: Secondary | ICD-10-CM | POA: Insufficient documentation

## 2015-12-10 DIAGNOSIS — I10 Essential (primary) hypertension: Secondary | ICD-10-CM

## 2015-12-10 DIAGNOSIS — E1165 Type 2 diabetes mellitus with hyperglycemia: Secondary | ICD-10-CM | POA: Diagnosis not present

## 2015-12-10 DIAGNOSIS — G4733 Obstructive sleep apnea (adult) (pediatric): Secondary | ICD-10-CM

## 2015-12-10 DIAGNOSIS — Z794 Long term (current) use of insulin: Secondary | ICD-10-CM

## 2015-12-10 DIAGNOSIS — C649 Malignant neoplasm of unspecified kidney, except renal pelvis: Secondary | ICD-10-CM | POA: Insufficient documentation

## 2015-12-10 DIAGNOSIS — Z905 Acquired absence of kidney: Secondary | ICD-10-CM | POA: Diagnosis not present

## 2015-12-10 DIAGNOSIS — C642 Malignant neoplasm of left kidney, except renal pelvis: Secondary | ICD-10-CM

## 2015-12-10 LAB — BASIC METABOLIC PANEL WITH GFR
BUN: 17 mg/dL (ref 6–23)
CO2: 27 meq/L (ref 19–32)
Calcium: 9.6 mg/dL (ref 8.4–10.5)
Chloride: 103 meq/L (ref 96–112)
Creatinine, Ser: 1.08 mg/dL (ref 0.40–1.50)
GFR: 75.97 mL/min
Glucose, Bld: 78 mg/dL (ref 70–99)
Potassium: 3.9 meq/L (ref 3.5–5.1)
Sodium: 138 meq/L (ref 135–145)

## 2015-12-10 LAB — HEPATIC FUNCTION PANEL
ALBUMIN: 4.3 g/dL (ref 3.5–5.2)
ALK PHOS: 108 U/L (ref 39–117)
ALT: 48 U/L (ref 0–53)
AST: 25 U/L (ref 0–37)
BILIRUBIN DIRECT: 0.1 mg/dL (ref 0.0–0.3)
TOTAL PROTEIN: 7.3 g/dL (ref 6.0–8.3)
Total Bilirubin: 0.5 mg/dL (ref 0.2–1.2)

## 2015-12-10 LAB — MICROALBUMIN / CREATININE URINE RATIO
Creatinine,U: 142.6 mg/dL
Microalb Creat Ratio: 15.8 mg/g (ref 0.0–30.0)
Microalb, Ur: 22.5 mg/dL — ABNORMAL HIGH (ref 0.0–1.9)

## 2015-12-10 LAB — HEMOGLOBIN A1C: HEMOGLOBIN A1C: 7.6 % — AB (ref 4.6–6.5)

## 2015-12-10 MED ORDER — GLUCOSE BLOOD VI STRP: ORAL_STRIP | Status: DC

## 2015-12-10 MED ORDER — TAMSULOSIN HCL 0.4 MG PO CAPS: 0.4000 mg | ORAL_CAPSULE | Freq: Every day | ORAL | Status: DC

## 2015-12-10 MED ORDER — LISINOPRIL 20 MG PO TABS: ORAL_TABLET | ORAL | Status: DC

## 2015-12-10 MED ORDER — ONETOUCH ULTRASOFT LANCETS MISC: Status: DC

## 2015-12-10 NOTE — Assessment & Plan Note (Signed)
He has seen Clint Young in the past, [facility]

## 2015-12-10 NOTE — Assessment & Plan Note (Signed)
Viagra- [facility] by [facility].

## 2015-12-10 NOTE — Assessment & Plan Note (Signed)
Uncontrolled, advised pt to keep upcoming apt with endo.

## 2015-12-10 NOTE — Assessment & Plan Note (Signed)
Reports voiding well on flomax.

## 2015-12-10 NOTE — Assessment & Plan Note (Signed)
toleranting statin,  Obtain lipid panel.

## 2015-12-10 NOTE — Assessment & Plan Note (Signed)
Stable on citalopram, continue same.  

## 2015-12-10 NOTE — Assessment & Plan Note (Signed)
Refer to nephrology.  Obtain bmet.

## 2015-12-10 NOTE — Assessment & Plan Note (Signed)
Stable on protonix. Reports that in the past he was not controlled on omeprazole.

## 2015-12-10 NOTE — Progress Notes (Signed)
Subjective:    Patient ID: GEOFF WARLEY, male    DOB: Mar 19, 1961, 54 y.o.   MRN: [id number]  HPI  Mr. Agosta is a 54 yr old male who presents today to establish care.  1) DM2- reports last A1C 7/16 was 10.  Current meds include novolog 70/30 40 units bid and metformin xr 500mg . Has apt on Thursday with endo at the [place].  Previously on lantus + novolog pen.  Fasting sugar 170 this AM.   2) solitary kidney/Renal insufficiency/renal insufficiency- no available recent Cr on file.  He is not currently on Ace or Arb.  Sees Dr. [name] (urology) he does not have a nephrologist.    3) OSA- patient is treated with Bipap. Managed by pulm at [facility]  4) gerd- on PPI. Reports well controlled on protonix.   5) BPH- maintained on flomax.  6) Asthma- on asmanex and prn albuterol. Sees pulmonary at [facility]  7) Hypogonadism/ED- on androgel. Prn viagra  8) depression-  On citalopram. Reports depression is well controlled.  9) Hyperlipidemia- on atorvastatin.  Reports that his cholesterol has been well controlled.    10) HTN- maintained on hctz.   BP Readings from Last 3 Encounters:  12/10/15 128/90  11/08/15 122/74    Review of Systems  Constitutional:       Reports weight trending  upward  HENT: Negative for rhinorrhea.   Respiratory: Negative for cough.   Cardiovascular: Negative for leg swelling.  Gastrointestinal: Negative for constipation and blood in stool.       Occasional diarrhea  Genitourinary: Negative for dysuria, frequency and hematuria.  Musculoskeletal: Negative for myalgias.       Some left hand pain, seeing [facility] thinks carpal tunnel syndrome  Skin:       Reports that he has some dry skin on his back- itch  Neurological: Negative for headaches.  Hematological: Negative for adenopathy.  Psychiatric/Behavioral:       See HPI   Past Medical History  Diagnosis Date  . Hypertension   . Asthma   . Diabetes ([place])   . Hypercholesteremia   . Sinus congestion   . Kidney  disease   . Deviated septum   . Sleep apnea   . Carpal tunnel syndrome   . Hepatitis A 1980  . Compression fracture of lumbar vertebra ([place])   . Cancer of kidney [facility])     s/p L nephrectomy (renal cell carcinoma)    Social History   Social History  . Marital Status: Married    Spouse Name: N/A  . Number of Children: N/A  . Years of Education: N/A   Occupational History  . Not on file.   Social History Main Topics  . Smoking status: Former Smoker -- 1.00 packs/day for 10 years    Types: Cigarettes    Quit date: 12/28/1978  . Smokeless tobacco: Never Used  . Alcohol Use: Not on file  . Drug Use: Not on file  . Sexual Activity: Not on file   Other Topics Concern  . Not on file   Social History Narrative    Past Surgical History  Procedure Laterality Date  . Nephrectomy Left 2003  . Knee surgery Left     X2  . Back surgery  2007  . Ankle surgery    . Hand surgery Right     Family History  Problem Relation Age of Onset  . Allergies Brother   . Allergies Sister   . Asthma Sister   .  Heart disease Mother   . Rheumatologic disease Mother   . Heart disease Father   . Rheumatologic disease Sister   . Cancer Paternal Grandfather     leukemia  . Glaucoma Maternal Grandmother   . Leukemia Maternal Grandfather   . Heart attack Brother     Allergies  Allergen Reactions  . Niacin And Related Hives    Current Outpatient Prescriptions on File Prior to Visit  Medication Sig Dispense Refill  . ALBUTEROL IN Inhale 2 puffs into the lungs as needed.    Marland Kitchen atorvastatin (LIPITOR) 80 MG tablet Take 80 mg by mouth daily.    . Cholecalciferol (VITAMIN D-3 PO) Take 2,000 Int'l Units by mouth daily.    . citalopram (CELEXA) 40 MG tablet Take 40 mg by mouth daily.    . fluticasone (FLONASE) 50 MCG/ACT nasal spray Place 2 sprays into both nostrils daily.    Marland Kitchen gabapentin (NEURONTIN) 600 MG tablet Take 600 mg by mouth 2 (two) times daily.     . hydrochlorothiazide  (HYDRODIURIL) 25 MG tablet Take 25 mg by mouth daily.    . insulin aspart protamine- aspart (NOVOLOG MIX 70/30) (70-30) 100 UNIT/ML injection Inject 40 Units into the skin 2 (two) times daily with a meal.     . Insulin Pen Needle (NOVOFINE) 30G X 8 MM MISC Inject 1 packet into the skin as needed.    . Insulin Syringe-Needle U-100 (INSULIN SYRINGE .3CC/31GX5/16") 31G X 5/16" 0.3 ML MISC by Does not apply route.    Marland Kitchen ketotifen (ZADITOR) 0.025 % ophthalmic solution Place 1 drop into both eyes 2 (two) times daily.     Marland Kitchen lidocaine (LMX) 4 % cream Apply 1 application topically as needed.    . metFORMIN (GLUCOPHAGE-XR) 500 MG 24 hr tablet Take 500 mg by mouth daily with breakfast.    . mometasone (ASMANEX) 220 MCG/INH inhaler Inhale 2 puffs into the lungs at bedtime.    . pantoprazole (PROTONIX) 40 MG tablet Take 40 mg by mouth daily.    . sildenafil (VIAGRA) 100 MG tablet Take 100 mg by mouth as needed for erectile dysfunction.    Marland Kitchen testosterone (ANDROGEL) 50 MG/5GM (1%) GEL Place 5 g onto the skin daily.     No current facility-administered medications on file prior to visit.    BP 128/90 mmHg  Pulse 77  Temp(Src) 98.3 F (36.8 C) (Oral)  Resp 16  Ht 5\' 5"  (1.651 m)  Wt 227 lb 6.4 oz (103.148 kg)  BMI 37.84 kg/m2  SpO2 97%       Objective:   Physical Exam  Constitutional: He is oriented to person, place, and time. He appears well-developed and well-nourished. No distress.  HENT:  Head: Normocephalic and atraumatic.  Right Ear: Tympanic membrane and ear canal normal.  Left Ear: Tympanic membrane and ear canal normal.  Mouth/Throat: No oropharyngeal exudate, posterior oropharyngeal edema or posterior oropharyngeal erythema.  Eyes: No scleral icterus.  Cardiovascular: Normal rate and regular rhythm.   No murmur heard. Pulmonary/Chest: Effort normal and breath sounds normal. No respiratory distress. He has no wheezes. He has no rales.  Musculoskeletal: He exhibits no edema.    Lymphadenopathy:    He has no cervical adenopathy.  Neurological: He is alert and oriented to person, place, and time.  Skin: Skin is warm and dry.  Psychiatric: He has a normal mood and affect. His behavior is normal. Thought content normal.          Assessment & Plan:

## 2015-12-10 NOTE — Assessment & Plan Note (Signed)
Maintained on hctz, bp OK on this, continue same.

## 2015-12-10 NOTE — Patient Instructions (Addendum)
Please arrange follow up with Dr. [name].  Complete lab work prior to leaving. Welcome to [facility].

## 2015-12-10 NOTE — Assessment & Plan Note (Signed)
Patient was followed by urology.  Urology has monitored him.

## 2015-12-10 NOTE — Progress Notes (Signed)
Pre visit review using our clinic review tool, if applicable. No additional management support is needed unless otherwise documented below in the visit note. 

## 2016-01-14 MED FILL — TAMSULOSIN HCL 0.4 MG CAP: 0.4 | 30 days supply | Qty: 30 | Fill #1

## 2016-02-26 MED FILL — TAMSULOSIN HCL 0.4 MG CAP: 0.4 | 30 days supply | Qty: 30 | Fill #2

## 2016-03-02 MED FILL — ONE TOUCH DELICA 33G LANCET: 33 days supply | Qty: 100 | Fill #0

## 2016-04-01 MED FILL — TAMSULOSIN HCL 0.4 MG CAP: 0.4 | 30 days supply | Qty: 30 | Fill #3

## 2016-04-06 MED FILL — ONE TOUCH ULTRA TEST STRIPS: 33 days supply | Qty: 100 | Fill #1

## 2016-04-17 ENCOUNTER — Other Ambulatory Visit: Payer: Self-pay | Admitting: Nephrology

## 2016-04-17 DIAGNOSIS — R809 Proteinuria, unspecified: Secondary | ICD-10-CM

## 2016-04-17 DIAGNOSIS — E1122 Type 2 diabetes mellitus with diabetic chronic kidney disease: Secondary | ICD-10-CM

## 2016-04-17 DIAGNOSIS — I129 Hypertensive chronic kidney disease with stage 1 through stage 4 chronic kidney disease, or unspecified chronic kidney disease: Secondary | ICD-10-CM

## 2016-04-17 DIAGNOSIS — N181 Chronic kidney disease, stage 1: Secondary | ICD-10-CM

## 2016-04-23 ENCOUNTER — Other Ambulatory Visit: Payer: Medicare Other

## 2016-05-04 ENCOUNTER — Other Ambulatory Visit: Payer: Medicare Other

## 2016-05-11 ENCOUNTER — Ambulatory Visit
Admission: RE | Admit: 2016-05-11 | Discharge: 2016-05-11 | Disposition: A | Discharge status: Home / Self Care | Payer: Medicare Other | Source: Ambulatory Visit | Attending: Nephrology | Admitting: Nephrology

## 2016-05-11 DIAGNOSIS — I129 Hypertensive chronic kidney disease with stage 1 through stage 4 chronic kidney disease, or unspecified chronic kidney disease: Secondary | ICD-10-CM

## 2016-05-11 DIAGNOSIS — E1122 Type 2 diabetes mellitus with diabetic chronic kidney disease: Secondary | ICD-10-CM

## 2016-05-11 DIAGNOSIS — R809 Proteinuria, unspecified: Secondary | ICD-10-CM

## 2016-05-11 DIAGNOSIS — N181 Chronic kidney disease, stage 1: Secondary | ICD-10-CM

## 2016-05-26 MED FILL — TAMSULOSIN HCL 0.4 MG CAP: 0.4 | 30 days supply | Qty: 30 | Fill #4

## 2016-06-01 MED FILL — ONE TOUCH ULTRA TEST STRIPS: 33 days supply | Qty: 100 | Fill #2

## 2016-07-06 MED FILL — TAMSULOSIN HCL 0.4 MG CAP: 0.4 | 30 days supply | Qty: 30 | Fill #5

## 2016-08-03 MED FILL — ONE TOUCH DELICA 33G LANCET: 33 days supply | Qty: 100 | Fill #1

## 2016-08-03 MED FILL — ONE TOUCH ULTRA TEST STRIPS: 33 days supply | Qty: 100 | Fill #3

## 2016-08-06 ENCOUNTER — Other Ambulatory Visit: Payer: Self-pay | Admitting: Family

## 2016-08-07 NOTE — Telephone Encounter (Signed)
Left message for pt to call back and set up a follow up appointment with Melissa in the next month.  Please have the patient come in for a follow up appointment.

## 2016-08-17 MED FILL — TAMSULOSIN HCL 0.4 MG CAP: 0.4 | 30 days supply | Qty: 30 | Fill #0

## 2016-08-28 ENCOUNTER — Ambulatory Visit (INDEPENDENT_AMBULATORY_CARE_PROVIDER_SITE_OTHER): Payer: Medicare Other | Admitting: Family

## 2016-08-28 ENCOUNTER — Encounter: Payer: Self-pay | Admitting: Family

## 2016-08-28 VITALS — BP 134/92 | HR 73 | Temp 98.5°F | Resp 18 | Ht 65.0 in | Wt 226.8 lb

## 2016-08-28 DIAGNOSIS — R1310 Dysphagia, unspecified: Secondary | ICD-10-CM

## 2016-08-28 DIAGNOSIS — G4733 Obstructive sleep apnea (adult) (pediatric): Secondary | ICD-10-CM

## 2016-08-28 DIAGNOSIS — E291 Testicular hypofunction: Secondary | ICD-10-CM | POA: Diagnosis not present

## 2016-08-28 DIAGNOSIS — E039 Hypothyroidism, unspecified: Secondary | ICD-10-CM | POA: Diagnosis not present

## 2016-08-28 DIAGNOSIS — R7989 Other specified abnormal findings of blood chemistry: Secondary | ICD-10-CM

## 2016-08-28 DIAGNOSIS — Z23 Encounter for immunization: Secondary | ICD-10-CM | POA: Diagnosis not present

## 2016-08-28 DIAGNOSIS — R002 Palpitations: Secondary | ICD-10-CM

## 2016-08-28 DIAGNOSIS — N529 Male erectile dysfunction, unspecified: Secondary | ICD-10-CM

## 2016-08-28 MED ORDER — LEVOTHYROXINE SODIUM 50 MCG PO TABS: 50.0000 ug | ORAL_TABLET | Freq: Every day | ORAL | 3 refills | Status: DC

## 2016-08-28 MED FILL — LEVOTHYROXINE 50 MCG TABLET: 50 | 30 days supply | Qty: 30 | Fill #0

## 2016-08-28 NOTE — Progress Notes (Signed)
Pre visit review using our clinic review tool, if applicable. No additional management support is needed unless otherwise documented below in the visit note. 

## 2016-08-28 NOTE — Assessment & Plan Note (Signed)
Uncontrolled. Refer back to urology.

## 2016-08-28 NOTE — Patient Instructions (Addendum)
Your will be contacted about your referral to Dr. [name] re: low testosterone and ED symptoms. For hypothyroid begin synthroid once daily in the AM. We will arrange follow up with Dr. Annamaria Boots for your sleep apnea and Bipap settings.  [facility] about [facility] holter monitor (EKG) to further evaluate your palpitations. Let me know if you decide you would like [place] to place the order.

## 2016-08-28 NOTE — Assessment & Plan Note (Signed)
Declines GI referral at this time.  

## 2016-08-28 NOTE — Assessment & Plan Note (Signed)
New diagnosis per recent [place] labs which are reviewed. Will begin Synthroid as pt is symptomatic with fatigue.

## 2016-08-28 NOTE — Assessment & Plan Note (Signed)
Still snoring, still fatigued. May need settings adjusted on Bipap. Will refer to Dr. Annamaria Boots whom he has seen in the past.

## 2016-08-28 NOTE — Assessment & Plan Note (Signed)
Reviewed lab work from [place]. Testosterone level remains low despite androgel- likely contributing to his fatigue. Will refer back to Dr. [name] for ongoing management.

## 2016-08-28 NOTE — Progress Notes (Signed)
Subjective:    Patient ID: [name], male    DOB: 25-Dec-1961, 55 y.o.   MRN: [id number]  HPI  [name] is a 55 yr old male with multiple medical problems including IDDM2, depression, and solitary kidney secondary to renal cell carcinoma,  who presents today with several concerns:  Palpitations- reports 3 week hx of palpitations/shortness of breath.He reports that these symptoms come and go and do not come with activity.   Fatigue-  Reports intermittent fatigue for several months. This fatigue seems to be worsening.  He reports daytime somnolence and will sometimes fall asleep while talking.  Reports that he is compliant with Bipap. His OSA is managed by the [place]. He has seen Dr. Keturah Barre in the past. [facility], fatigue has worsened since them.  Reports + dysphagia.  He continues protonix. Declines GI referral at [facility] time.   Hypogonadism- using androgel however his level is still low.  He reports that he has seen Dr. [name].  HTN- has not taken AM meds.     Review of Systems    see HPI  Past Medical History:  Diagnosis Date  . Asthma   . Cancer of kidney ([place])    s/p L nephrectomy (renal cell carcinoma)  . Carpal tunnel syndrome   . Compression fracture of lumbar vertebra ([place])   . Deviated septum   . Diabetes ([place])   . Hepatitis A 1980  . Hypercholesteremia   . Hypertension   . Kidney disease   . Sinus congestion   . Sleep apnea      Social History   Social History  . Marital status: Married    Spouse name: N/A  . Number of children: N/A  . Years of education: N/A   Occupational History  . Not on file.   Social History Main Topics  . Smoking status: Former Smoker    Packs/day: 1.00    Years: 10.00    Types: Cigarettes    Quit date: 12/28/1978  . Smokeless tobacco: Never Used  . Alcohol use Not on file  . Drug use: Unknown  . Sexual activity: Not on file   Other Topics Concern  . 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 ANKLE SURGERY    . BACK SURGERY  2007  . HAND SURGERY Right   . KNEE SURGERY Left    X2  . NEPHRECTOMY Left 2003    Family History  Problem Relation Age of Onset  . Allergies Brother   . Allergies Sister   . Asthma Sister   . Heart disease Mother   . Rheumatologic disease Mother   . Heart disease Father   . Rheumatologic disease Sister   . Cancer Paternal Grandfather     leukemia  . Glaucoma Maternal Grandmother   . Leukemia Maternal Grandfather   . Heart attack Brother     Allergies  Allergen Reactions  . Niacin And Related Hives    Current Outpatient Prescriptions on File Prior to Visit  Medication Sig Dispense Refill  . ALBUTEROL IN Inhale 2 puffs into the  lungs as needed.    Marland Kitchen atorvastatin (LIPITOR) 80 MG tablet Take 80 mg by mouth daily.    . Cholecalciferol (VITAMIN D-3 PO) Take 2,000 Int'l Units by mouth daily.    . citalopram (CELEXA) 40 MG tablet Take 40 mg by mouth daily.    . fluticasone (FLONASE) 50 MCG/ACT nasal spray Place 2 sprays into both nostrils daily.    Marland Kitchen gabapentin (NEURONTIN) 600 MG tablet Take 600 mg by mouth 2 (two) times daily.     Marland Kitchen glucose blood (ONE TOUCH ULTRA TEST) test strip Use as instructed to check blood sugar three times daily. 100 each 5  . hydrochlorothiazide (HYDRODIURIL) 25 MG tablet Take 25 mg by mouth daily.    . insulin aspart protamine- aspart (NOVOLOG MIX 70/30) (70-30) 100 UNIT/ML injection Inject 52 Units into the skin 2 (two) times daily with a meal.     . Insulin Pen Needle (NOVOFINE) 30G X 8 MM MISC Inject 1 packet into the skin as needed.    . Insulin Syringe-Needle U-100 (INSULIN SYRINGE  .3CC/31GX5/16") 31G X 5/16" 0.3 ML MISC by Does not apply route.    Marland Kitchen ketotifen (ZADITOR) 0.025 % ophthalmic solution Place 1 drop into both eyes 2 (two) times daily.     . Lancets (ONETOUCH ULTRASOFT) lancets Use as instructed 100 each 2  . lidocaine (LMX) 4 % cream Apply 1 application topically as needed.    Marland Kitchen lisinopril (PRINIVIL,ZESTRIL) 20 MG tablet 1/2 tab by mouth once daily    . metFORMIN (GLUCOPHAGE-XR) 500 MG 24 hr tablet Take 500 mg by mouth daily with breakfast.    . mometasone (ASMANEX) 220 MCG/INH inhaler Inhale 2 puffs into the lungs at bedtime.    . pantoprazole (PROTONIX) 40 MG tablet Take 40 mg by mouth daily.    . sildenafil (VIAGRA) 100 MG tablet Take 100 mg by mouth as needed for erectile dysfunction.    . tamsulosin (FLOMAX) 0.4 MG CAPS capsule TAKE 1 CAPSULE (0.4 MG TOTAL) BY MOUTH DAILY. 30 capsule 0  . testosterone (ANDROGEL) 50 MG/5GM (1%) GEL Place 5 g onto the skin daily.     No current facility-administered medications on file prior to visit.     BP (!) 134/92 Comment: Pt has not taken meds this morning.  Pulse 73   Temp 98.5 F (36.9 C) (Oral)   Resp 18   Ht 5\' 5"  (1.651 m)   Wt 226 lb 12.8 oz (102.9 kg)   SpO2 99% Comment: room air  BMI 37.74 kg/m    Objective:   Physical Exam  Constitutional: He is oriented to person, place, and time. He appears well-developed and well-nourished. No distress.  HENT:  Head: Normocephalic and atraumatic.  Cardiovascular: Normal rate and regular rhythm.   No murmur heard. Pulmonary/Chest: Effort normal and breath sounds normal. No respiratory distress. He has no wheezes. He has no rales.  Musculoskeletal: He exhibits no edema.  Neurological: He is alert and oriented to person, place, and time.  Skin: Skin is warm and dry.  Psychiatric: He has a normal mood and affect. His behavior is normal. Thought content normal.          Assessment & Plan:  Palpitations- recommended holter monitor. He states he will  complete at the [place]. EKG tracing is personally reviewed.  EKG notes NSR.  No acute changes. Reviewed electrolytes/cbc from [place].

## 2016-09-09 ENCOUNTER — Other Ambulatory Visit: Payer: Self-pay | Admitting: Medical

## 2016-09-14 ENCOUNTER — Other Ambulatory Visit: Payer: Self-pay | Admitting: Medical

## 2016-09-15 ENCOUNTER — Encounter: Payer: Self-pay | Admitting: Family

## 2016-09-16 ENCOUNTER — Telehealth: Payer: Self-pay

## 2016-09-16 MED ORDER — TAMSULOSIN HCL 0.4 MG PO CAPS: 0.4000 mg | ORAL_CAPSULE | Freq: Every day | ORAL | 5 refills | Status: DC

## 2016-09-16 MED FILL — TAMSULOSIN HCL 0.4 MG CAP: 0.4 | 30 days supply | Qty: 30 | Fill #0

## 2016-09-16 NOTE — Telephone Encounter (Signed)
  Refill sent.   Bunnie Domino, LPN  You 15 minutes ago (11:45 AM)    Wannetta Sender can this patient have refill on Tamsolosin (Routing comment)

## 2016-09-24 ENCOUNTER — Institutional Professional Consult (permissible substitution): Payer: Medicare Other | Admitting: Pulmonary Disease

## 2016-09-25 ENCOUNTER — Ambulatory Visit: Payer: Medicare Other | Admitting: Family

## 2016-09-28 MED FILL — LEVOTHYROXINE 50 MCG TABLET: 50 | 30 days supply | Qty: 30 | Fill #1

## 2016-09-28 MED FILL — ONE TOUCH ULTRA TEST STRIPS: 33 days supply | Qty: 100 | Fill #4

## 2016-10-23 MED FILL — TAMSULOSIN HCL 0.4 MG CAP: 0.4 | 30 days supply | Qty: 30 | Fill #1

## 2016-10-28 MED FILL — LEVOTHYROXINE 50 MCG TABLET: 50 | 30 days supply | Qty: 30 | Fill #2

## 2016-11-12 ENCOUNTER — Institutional Professional Consult (permissible substitution): Payer: Medicare Other | Admitting: Pulmonary Disease

## 2016-11-24 MED FILL — TAMSULOSIN HCL 0.4 MG CAP: 0.4 | 30 days supply | Qty: 30 | Fill #2

## 2016-12-04 ENCOUNTER — Telehealth: Payer: Self-pay | Admitting: Pulmonary Disease

## 2016-12-04 MED FILL — LEVOTHYROXINE 50 MCG TABLET: 50 | 30 days supply | Qty: 30 | Fill #3

## 2016-12-04 NOTE — Telephone Encounter (Signed)
Looked for the paper work but did not see it. Did ask that it be sent again. WIll forward to Leigh to be on the look out. They just need to verify some coding and charges from when this pt. Saw Dr. Lenna Gilford

## 2016-12-10 NOTE — Telephone Encounter (Signed)
Forms have been completed by SN and faxed back to [phone].  Nothing further is needed.

## 2016-12-29 ENCOUNTER — Telehealth: Payer: Self-pay | Admitting: Family

## 2016-12-29 MED FILL — ONE TOUCH ULTRA TEST STRIPS: 34 days supply | Qty: 100 | Fill #0

## 2016-12-29 MED FILL — TAMSULOSIN HCL 0.4 MG CAP: 0.4 | 30 days supply | Qty: 30 | Fill #3

## 2016-12-29 NOTE — Telephone Encounter (Signed)
OneTouch refills sent to pharmacy. Pt last seen by PCP on 08/28/16 and advised to follow up in 4 weeks. Pt is past due. Please call pt to schedule appt. Thanks!

## 2016-12-30 NOTE — Telephone Encounter (Signed)
lvm for pt advising that an appt is needed and to call our office to schedule.

## 2016-12-30 NOTE — Telephone Encounter (Signed)
My chart message sent to pt.

## 2017-01-06 ENCOUNTER — Ambulatory Visit (INDEPENDENT_AMBULATORY_CARE_PROVIDER_SITE_OTHER): Payer: Medicare Other | Admitting: Pulmonary Disease

## 2017-01-06 ENCOUNTER — Encounter: Payer: Self-pay | Admitting: Pulmonary Disease

## 2017-01-06 VITALS — BP 124/82 | HR 81 | Ht 65.0 in | Wt 227.4 lb

## 2017-01-06 DIAGNOSIS — J45909 Unspecified asthma, uncomplicated: Secondary | ICD-10-CM | POA: Diagnosis not present

## 2017-01-06 DIAGNOSIS — R0600 Dyspnea, unspecified: Secondary | ICD-10-CM

## 2017-01-06 DIAGNOSIS — G4733 Obstructive sleep apnea (adult) (pediatric): Secondary | ICD-10-CM | POA: Diagnosis not present

## 2017-01-06 DIAGNOSIS — E669 Obesity, unspecified: Secondary | ICD-10-CM | POA: Insufficient documentation

## 2017-01-06 DIAGNOSIS — Z6837 Body mass index (BMI) 37.0-37.9, adult: Secondary | ICD-10-CM

## 2017-01-06 DIAGNOSIS — E6609 Other obesity due to excess calories: Secondary | ICD-10-CM

## 2017-01-06 HISTORY — DX: Morbid (severe) obesity due to excess calories: E66.01

## 2017-01-06 NOTE — Assessment & Plan Note (Signed)
>>  ASSESSMENT AND PLAN FOR ASTHMA WRITTEN ON 01/06/2017 12:12 PM BY DE DIOS, JOSE ANGELO A, MD  Stable on asmanex BID and alb prn.

## 2017-01-06 NOTE — Assessment & Plan Note (Signed)
Stable on asmanex BID and alb prn.

## 2017-01-06 NOTE — Assessment & Plan Note (Signed)
Pt was diagnosed with OSA in 2002.  RDI was 68. He had snoring, gasping, choking, witnessed apneas, unrefreshed sleep. He was placed on cpap  He felt better with cpap but still had slepeiness.  He had a rpt sleep study at the [place] in [place] in 2015 and that showed  He needed bipap. He is on autobipap  ipap max 20, epap min 13. He feels better using it. Has a FFM.  He gets supplies from the [place].   Download the last month (Jan 2018) 100%, AHI 2.1.   Patient remmains sleep despite using bipap.  He sleeps 7 hrs/night. He uses bipap at HS.  His hypersomnia improved initially but has gotten worse.  ESS 15.  He is a very sleepy person and is very symptomatic.  Wife complains of sleepiness.   He is on disability.   He has been on neurontin since 2009.  He has severe neuropathy.  His neurontin dose was increased recently. He denies taking benzos and opiates. He denies drinking ETOH frequently. He has gotten sleepier since the Neurontin dose was increased.  Plan :  We extensively discussed the importance of treating OSA and the need to use PAP therapy.   Continue with autobipap for now. IPAP max 20 cm water,  EPAP min 13 cm water. Good compliance and download. He remains sleepy. We need to rule out obesity hypoventilation syndrome. Plan to get ABG. If he has OHS, plan to get [place] or NIV via [place].  If ABG is U/R, plan to start Provigil or Nuvigil if pt is OK with it.  I think Neurontin is making him sleepy but I doubt we can cut down the dose. He may also want to touch base with his neurologist.   Patient was instructed to have mask, tubings, filter, reservoir cleaned at least once a week with soapy water.  Patient was instructed to call the office if he/she is having issues with the PAP device.    I advised patient to obtain sufficient amount of sleep --  7 to 8 hours at least in a 24 hr period.  Patient was advised to follow good sleep hygiene.  Patient was advised NOT to engage in  activities requiring concentration and/or vigilance if he/she is and  sleepy.  Patient is NOT to drive if he/she is sleepy.

## 2017-01-06 NOTE — Patient Instructions (Signed)
  It was a pleasure taking care of you today!  Continue using your autoBiPAP machine.  We will get a blood test and call you with results.  Please make sure you use your CPAP device everytime you sleep.  We will monitor the usage of your machine per your insurance requirement.  Your insurance company may take the machine from you if you are not using it regularly.   Please clean the mask, tubings, filter, water reservoir with soapy water every week.  Please use distilled water for the water reservoir.   Please call the office or your machine provider (DME company) if you are having issues with the device.   Return to clinic in 2 months with Dr. Corrie Dandy

## 2017-01-06 NOTE — Assessment & Plan Note (Signed)
Weight reduction 

## 2017-01-06 NOTE — Progress Notes (Signed)
Subjective:    Patient ID: [name], male    DOB: 06-Dec-1961, 56 y.o.   MRN: [id number]  HPI   This is the case of [name], 56 y.o. Male, who was referred by [name] in consultation regarding OSA.   As you very well know, patient is a non smoker, diagnosed with asthma controlled with asmanex BID and alb pr.  He was diagnosed with OSA in 2002.  RDI was 68. He had snoring, gasping, choking, witnessed apneas, unrefreshed sleep. He was placed on cpap  He felt better with cpap but still had slepeiness.  He had a rpt sleep study at the [facility] [place] in [place] in 2015 and that showed  He needed bipap. He is on autobipap  ipap max 20, epap min 13. He feels better using it. Has a FFM.  He gets supplies from the [place].   Download the last month (Jan 2018) 100%, AHI 2.1.   Patient remmains sleep despite using bipap.  He sleeps 7 hrs/night. He uses bipap at HS.  His hypersomnia improved initially but has gotten worse.  ESS 15.  He is a very sleepy person and is very symptomatic.  Wife complains of sleepiness.   He is on disability.   He has been on neurontin since 2009.  He has severe neuropathy.  His neurontin dose was increased recently. He denies taking benzos and opiates. He denies drinking ETOH frequently. He has gotten sleepier since the Neurontin dose was increased.   Review of Systems  Constitutional: Negative.  Negative for fever and unexpected weight change.  HENT: Positive for congestion. Negative for dental problem, ear pain, nosebleeds, postnasal drip, rhinorrhea, sinus pressure, sneezing, sore throat and trouble swallowing.   Eyes: Positive for redness. Negative for itching.  Respiratory: Positive for shortness of breath. Negative for cough, chest tightness and wheezing.   Cardiovascular: Negative.  Negative for palpitations and leg swelling.  Gastrointestinal: Positive for nausea. Negative for vomiting.  Endocrine: Negative.   Genitourinary: Negative.   Negative for dysuria.  Musculoskeletal: Negative.  Negative for joint swelling.  Skin: Negative.  Negative for rash.  Allergic/Immunologic: Negative.   Neurological: Positive for headaches.  Hematological: Negative.  Does not bruise/bleed easily.  Psychiatric/Behavioral: Negative.  Negative for dysphoric mood. The patient is not nervous/anxious.    Past Medical History:  Diagnosis Date  . Asthma   . Cancer of kidney ([place])    s/p L nephrectomy (renal cell carcinoma)  . Carpal tunnel syndrome   . Compression fracture of lumbar vertebra ([place])   . Deviated septum   . Diabetes ([place])   . Hepatitis A 1980  . Hypercholesteremia   . Hypertension   . Kidney disease   . Sinus congestion   . Sleep apnea    (-) other CA.  (-) DVT  Family History  Problem Relation Age of Onset  . Allergies Brother   . Allergies Sister   . Asthma Sister   . Heart disease Mother   . Rheumatologic disease Mother   . Heart disease Father   . Rheumatologic disease Sister   . Cancer Paternal Grandfather     leukemia  . Glaucoma Maternal Grandmother   . Leukemia Maternal Grandfather   . Heart attack Brother      Past Surgical History:  Procedure Laterality Date  . ANKLE SURGERY    . BACK SURGERY  2007  . HAND SURGERY Right   . KNEE SURGERY Left    X2  .  NEPHRECTOMY Left 2003    Social History   Social History  . Marital status: Married    Spouse name: N/A  . Number of children: N/A  . Years of education: N/A   Occupational History  . Not on file.   Social History Main Topics  . Smoking status: Former Smoker    Packs/day: 1.00    Years: 10.00    Types: Cigarettes    Quit date: 12/28/1978  . Smokeless tobacco: Never Used  . Alcohol use Not on file  . Drug use: Unknown  . Sexual activity: Not on file   Other Topics Concern  . Not on file   Social History Narrative   1 son born 2004 [name]   Married   On Disability from [place]- (back pain, knee pain, ankle pain, neuropathy)    Completed associates degree   Former Electrical engineer, former Therapist, art   Born in [place] until age 22   Enjoys reading, Surveyor, mining, volunteering     Allergies  Allergen Reactions  . Niacin And Related Hives  . Oxycodone Hives     Outpatient Medications Prior to Visit  Medication Sig Dispense Refill  . ALBUTEROL IN Inhale 2 puffs into the lungs as needed.    Marland Kitchen atorvastatin (LIPITOR) 80 MG tablet Take 80 mg by mouth daily.    . cetirizine (ZYRTEC) 10 MG tablet Take 10 mg by mouth daily.    . Cholecalciferol (VITAMIN D-3 PO) Take 2,000 Int'l Units by mouth daily.    . citalopram (CELEXA) 40 MG tablet Take 40 mg by mouth daily.    Marland Kitchen gabapentin (NEURONTIN) 600 MG tablet Take 600 mg by mouth 4 (four) times daily.     . hydrochlorothiazide (HYDRODIURIL) 25 MG tablet Take 25 mg by mouth daily.    . insulin aspart protamine- aspart (NOVOLOG MIX 70/30) (70-30) 100 UNIT/ML injection Inject 52 Units into the skin 2 (two) times daily with a meal.     . Insulin Syringe-Needle U-100 (INSULIN SYRINGE .3CC/31GX5/16") 31G X 5/16" 0.3 ML MISC by Does not apply route.    Marland Kitchen ketotifen (ZADITOR) 0.025 % ophthalmic solution Place 1 drop into both eyes 2 (two) times daily.     . Lancets (ONETOUCH ULTRASOFT) lancets Use as instructed 100 each 2  . levothyroxine (SYNTHROID, LEVOTHROID) 50 MCG tablet Take 1 tablet (50 mcg total) by mouth daily. 30 tablet 3  . lidocaine (LMX) 4 % cream Apply 1 application topically as needed.    . metFORMIN (GLUCOPHAGE-XR) 500 MG 24 hr tablet Take 1,000 mg by mouth 2 (two) times daily.     . mometasone (ASMANEX) 220 MCG/INH inhaler Inhale 2 puffs into the lungs at bedtime.    . montelukast (SINGULAIR) 10 MG tablet Take 10 mg by mouth at bedtime.    . ONE TOUCH ULTRA TEST test strip USE AS INSTRUCTED TO CHECK BLOOD SUGAR THREE TIMES DAILY. 100 each 5  . pantoprazole (PROTONIX) 40 MG tablet Take 40 mg by mouth daily.    . sildenafil (VIAGRA) 100 MG tablet Take 100 mg by mouth as  needed for erectile dysfunction.    . tamsulosin (FLOMAX) 0.4 MG CAPS capsule Take 1 capsule (0.4 mg total) by mouth daily. 30 capsule 5  . testosterone (ANDROGEL) 50 MG/5GM (1%) GEL Place 5 g onto the skin daily.    . fluticasone (FLONASE) 50 MCG/ACT nasal spray Place 2 sprays into both nostrils daily.    . Insulin Pen Needle (NOVOFINE) 30G X 8 MM  MISC Inject 1 packet into the skin as needed.    Marland Kitchen lisinopril (PRINIVIL,ZESTRIL) 20 MG tablet 1/2 tab by mouth once daily (Patient not taking: Reported on 01/06/2017)     No facility-administered medications prior to visit.    Meds ordered this encounter  Medications  . SUMAtriptan (IMITREX) 100 MG tablet    Sig: Take 100 mg by mouth every 2 (two) hours as needed for migraine. May repeat in 2 hours if headache persists or recurs.  . mupirocin ointment (BACTROBAN) 2 %    Sig: Place 1 application into the nose 2 (two) times daily.  . promethazine (PHENERGAN) 25 MG tablet    Sig: Take 25 mg by mouth every 6 (six) hours as needed for nausea or vomiting.  . ranitidine (ZANTAC) 300 MG capsule    Sig: Take 300 mg by mouth every evening.        Objective:   Physical Exam   Vitals:  Vitals:   01/06/17 1134  BP: 124/82  Pulse: 81  SpO2: 97%  Weight: 227 lb 6.4 oz (103.1 kg)  Height: 5\' 5"  (1.651 m)    Constitutional/General:  Pleasant, well-nourished, well-developed, not in any distress,  Comfortably seating.  Well kempt  Body mass index is 37.84 kg/m. Wt Readings from Last 3 Encounters:  01/06/17 227 lb 6.4 oz (103.1 kg)  08/28/16 226 lb 12.8 oz (102.9 kg)  12/10/15 227 lb 6.4 oz (103.1 kg)    Neck circumference:   HEENT: Pupils equal and reactive to light and accommodation. Anicteric sclerae. Normal nasal mucosa.   No oral  lesions,  mouth clear,  oropharynx clear, no postnasal drip. (-) Oral thrush. No dental caries.  Airway - Mallampati class IV  Neck: No masses. Midline trachea. No JVD, (-) LAD. (-) bruits  appreciated.  Respiratory/Chest: Grossly normal chest. (-) deformity. (-) Accessory muscle use.  Symmetric expansion. (-) Tenderness on palpation.  Resonant on percussion.  Diminished BS on both lower lung zones. (-) wheezing, crackles, rhonchi (-) egophony  Cardiovascular: Regular rate and  rhythm, heart sounds normal, no murmur or gallops, no peripheral edema  Gastrointestinal:  Normal bowel sounds. Soft, non-tender. No hepatosplenomegaly.  (-) masses.   Musculoskeletal:  Normal muscle tone. Normal gait.   Extremities: Grossly normal. (-) clubbing, cyanosis.  (-) edema  Skin: (-) rash,lesions seen.   Neurological/Psychiatric : alert, oriented to time, place, person. Normal mood and affect         Assessment & Plan:  OSA (obstructive sleep apnea) Pt was diagnosed with OSA in 2002.  RDI was 68. He had snoring, gasping, choking, witnessed apneas, unrefreshed sleep. He was placed on cpap  He felt better with cpap but still had slepeiness.  He had a rpt sleep study at the [facility] [place] in [place] in 2015 and that showed  He needed bipap. He is on autobipap  ipap max 20, epap min 13. He feels better using it. Has a FFM.  He gets supplies from the [facility].   Download the last month (Jan 2018) 100%, AHI 2.1.   Patient remmains sleep despite using bipap.  He sleeps 7 hrs/night. He uses bipap at [facility].  His hypersomnia improved initially but has gotten worse.  ESS 15.  He is a very sleepy person and is very symptomatic.  Wife complains of sleepiness.   He is on disability.   He has been on neurontin since 2009.  He has severe neuropathy.  His neurontin dose was increased recently. He denies taking  benzos and opiates. He denies drinking ETOH frequently. He has gotten sleepier since the Neurontin dose was increased.  Plan :  We extensively discussed the importance of treating OSA and the need to use PAP therapy.   Continue with autobipap for now. IPAP max 20 cm water,  EPAP min 13 cm  water. Good compliance and download. He remains sleepy. We need to rule out obesity hypoventilation syndrome. Plan to get ABG. If he has OHS, plan to get [place] or NIV via [place].  If ABG is U/R, plan to start Provigil or Nuvigil if pt is OK with it.  I think Neurontin is making him sleepy but I doubt we can cut down the dose. He may also want to touch base with his neurologist.   Patient was instructed to have mask, tubings, filter, reservoir cleaned at least once a week with soapy water.  Patient was instructed to call the office if he/she is having issues with the PAP device.    I advised patient to obtain sufficient amount of sleep --  7 to 8 hours at least in a 24 hr period.  Patient was advised to follow good sleep hygiene.  Patient was advised NOT to engage in activities requiring concentration and/or vigilance if he/she is and  sleepy.  Patient is NOT to drive if he/she is sleepy.   Asthma Stable on asmanex BID and alb prn.   Obesity Weight reduction     Thank you very much for letting me participate in this patient's care. Please do not hesitate to give me a call if you have any questions or concerns regarding the treatment plan.   Patient will follow up with me in 2 months.     Monica Becton, MD 01/06/2017   12:12 PM [facility] [place] Pager: [phone] Office: [phone], Fax: [phone]

## 2017-01-07 ENCOUNTER — Institutional Professional Consult (permissible substitution): Payer: Medicare Other | Admitting: Pulmonary Disease

## 2017-01-07 ENCOUNTER — Ambulatory Visit (HOSPITAL_COMMUNITY)
Admission: RE | Admit: 2017-01-07 | Discharge: 2017-01-07 | Disposition: A | Discharge status: Home / Self Care | Payer: Medicare Other | Source: Ambulatory Visit | Attending: Pulmonary Disease | Admitting: Pulmonary Disease

## 2017-01-07 ENCOUNTER — Telehealth: Payer: Self-pay | Admitting: Pulmonary Disease

## 2017-01-07 ENCOUNTER — Other Ambulatory Visit: Payer: Self-pay | Admitting: Family

## 2017-01-07 DIAGNOSIS — R0600 Dyspnea, unspecified: Secondary | ICD-10-CM | POA: Diagnosis not present

## 2017-01-07 LAB — BLOOD GAS, ARTERIAL
ACID-BASE DEFICIT: 1.1 mmol/L (ref 0.0–2.0)
BICARBONATE: 23 mmol/L (ref 20.0–28.0)
FIO2: 0.21
O2 Saturation: 94.3 %
PCO2 ART: 38.7 mmHg (ref 32.0–48.0)
PH ART: 7.392 (ref 7.350–7.450)
Patient temperature: 98.6
pO2, Arterial: 75.7 mmHg — ABNORMAL LOW (ref 83.0–108.0)

## 2017-01-07 NOTE — Telephone Encounter (Signed)
   ABG did not reveal hypercapnia. Patient remains sleepy despite CPAP use. Discussed with the patient regarding results of blood gas. Discussed with him regarding starting modafinil, 200 mg during the day to help with hypersomnia. Discussed with him side effects such as cardiac side effects. Patient accepts risks and want to take modafinil. Follow-up as scheduled.  Jasmine>> can we order Modafinil, 200 mg/tab, 1 tab in am? #30 with 3 refills.   Thanks.   Monica Becton, MD 01/07/2017, 1:17 PM [facility] Pager [phone] After 3 pm or if no answer, call [phone]

## 2017-01-08 MED ORDER — MODAFINIL 200 MG PO TABS: 200.0000 mg | ORAL_TABLET | ORAL | 3 refills | Status: DC

## 2017-01-08 MED FILL — LEVOTHYROXINE 50 MCG TABLET: 50 | 30 days supply | Qty: 30 | Fill #0

## 2017-01-08 NOTE — Telephone Encounter (Signed)
Spoke with pt. To confirm the pharmacy that he wanted this medication to go to. The rx was sent to his pharmacy of choice. Nothing further is needed at this time.

## 2017-01-15 ENCOUNTER — Telehealth: Payer: Self-pay | Admitting: Pulmonary Disease

## 2017-01-15 NOTE — Telephone Encounter (Signed)
Modafinil 200mg  was approved through the insurance. Attempted to call pharmacy but no one answered.

## 2017-01-15 NOTE — Telephone Encounter (Signed)
PA started on Covermymeds. Will receive a response in 72 hours. Key for PA: [facility]

## 2017-01-18 NOTE — Telephone Encounter (Signed)
Spoke with pharmacy and made them aware, they made sure it was approved and it was. Pt. Is also aware nothing further is needed at this time

## 2017-02-02 MED FILL — MODAFINIL 200 MG TABLET: 200 | 30 days supply | Qty: 30 | Fill #0

## 2017-02-02 MED FILL — TAMSULOSIN HCL 0.4 MG CAP: 0.4 | 30 days supply | Qty: 30 | Fill #4

## 2017-02-05 ENCOUNTER — Encounter: Payer: Self-pay | Admitting: Family

## 2017-02-05 ENCOUNTER — Ambulatory Visit (INDEPENDENT_AMBULATORY_CARE_PROVIDER_SITE_OTHER): Payer: Medicare Other | Admitting: Family

## 2017-02-05 VITALS — BP 133/95 | HR 97 | Temp 98.1°F | Resp 16 | Ht 65.0 in | Wt 232.4 lb

## 2017-02-05 DIAGNOSIS — E039 Hypothyroidism, unspecified: Secondary | ICD-10-CM

## 2017-02-05 DIAGNOSIS — E291 Testicular hypofunction: Secondary | ICD-10-CM

## 2017-02-05 DIAGNOSIS — G4733 Obstructive sleep apnea (adult) (pediatric): Secondary | ICD-10-CM

## 2017-02-05 DIAGNOSIS — Z794 Long term (current) use of insulin: Secondary | ICD-10-CM | POA: Diagnosis not present

## 2017-02-05 DIAGNOSIS — E785 Hyperlipidemia, unspecified: Secondary | ICD-10-CM | POA: Diagnosis not present

## 2017-02-05 DIAGNOSIS — E7800 Pure hypercholesterolemia, unspecified: Secondary | ICD-10-CM

## 2017-02-05 DIAGNOSIS — E1140 Type 2 diabetes mellitus with diabetic neuropathy, unspecified: Secondary | ICD-10-CM

## 2017-02-05 DIAGNOSIS — E119 Type 2 diabetes mellitus without complications: Secondary | ICD-10-CM

## 2017-02-05 LAB — LIPID PANEL
CHOLESTEROL: 156 mg/dL (ref <200)
HDL: 33 mg/dL — ABNORMAL LOW (ref >40)
LDL Cholesterol: 76 mg/dL (ref <100)
TRIGLYCERIDES: 235 mg/dL — AB (ref <150)
Total CHOL/HDL Ratio: 4.7 Ratio (ref <5.0)
VLDL: 47 mg/dL — ABNORMAL HIGH (ref <30)

## 2017-02-05 LAB — COMPREHENSIVE METABOLIC PANEL
ALBUMIN: 4.2 g/dL (ref 3.6–5.1)
ALK PHOS: 122 U/L — AB (ref 40–115)
ALT: 64 U/L — ABNORMAL HIGH (ref 9–46)
AST: 42 U/L — AB (ref 10–35)
BILIRUBIN TOTAL: 0.5 mg/dL (ref 0.2–1.2)
BUN: 24 mg/dL (ref 7–25)
CALCIUM: 9.5 mg/dL (ref 8.6–10.3)
CO2: 25 mmol/L (ref 20–31)
Chloride: 100 mmol/L (ref 98–110)
Creat: 1.28 mg/dL (ref 0.70–1.33)
Glucose, Bld: 197 mg/dL — ABNORMAL HIGH (ref 65–99)
POTASSIUM: 3.8 mmol/L (ref 3.5–5.3)
Sodium: 136 mmol/L (ref 135–146)
Total Protein: 7.2 g/dL (ref 6.1–8.1)

## 2017-02-05 LAB — TSH: TSH: 1.4 mIU/L (ref 0.40–4.50)

## 2017-02-05 NOTE — Patient Instructions (Signed)
Please complete lab work prior to leaving.   

## 2017-02-05 NOTE — Assessment & Plan Note (Signed)
Tolerating statin, obtain lipid panel.  

## 2017-02-05 NOTE — Assessment & Plan Note (Signed)
Recent sugars have been high following steroid injections in his spine.  Advised pt to contact his endocrinologist for further instructions.

## 2017-02-05 NOTE — Progress Notes (Signed)
Subjective:    Patient ID: [name], male    DOB: April 20, 1961, 56 y.o.   MRN: [id number]  HPI  [name] is a 56 yr old male who presents today requesting a letter excusing him from jury duty due to his low back pain.  He is scheduled for 2/26.    Last visit we discussed c/o palpitations-  He preferred to complete the Holter at the [place] and was to arrange.  He reports that the [place] has scheduled him for a holter but want him to have a 2 D echo first.   OSA- on Bipap- saw Dr. Corrie Dandy. Was told that the gabapentin is likely cause for his fatigue. He was started modafinil which is helping his sleepiness.   Hypothyroid- reports feeling better since he started the synthroid.   Hypogonadism- was referred back to Dr. [name] last visit.   Hyperlipidemia- maintained on lipitor.    DM2- maintained on novolog mix 70/30.  He reports that the [place] is adjusts his sugars and that they have been running high. He has been seeing an endocrinologist. Reports that he has been receiving spinal injections which have been causing his sugars to run high.  He is scheduled to follow up with endocrinology in July.   Lab Results  Component Value Date   HGBA1C 7.6 (H) 12/10/2015     Review of Systems See HPI  Past Medical History:  Diagnosis Date  . Asthma   . Cancer of kidney ([place])    s/p L nephrectomy (renal cell carcinoma)  . Carpal tunnel syndrome   . Compression fracture of lumbar vertebra ([place])   . Deviated septum   . Diabetes ([place])   . Hepatitis A 1980  . Hypercholesteremia   . Hypertension   . Kidney disease   . Sinus congestion   . Sleep apnea      Social History   Social History  . Marital status: Married    Spouse name: N/A  . Number of children: N/A  . Years of education: N/A   Occupational History  . Not on file.   Social History Main Topics  . Smoking status: Former Smoker    Packs/day: 1.00    Years: 10.00    Types: Cigarettes    Quit date: 12/28/1978  .  Smokeless tobacco: Never Used  . Alcohol use Not on file  . Drug use: Unknown  . Sexual activity: Not on file   Other Topics Concern  . 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 ANKLE SURGERY    . BACK SURGERY  2007  . HAND SURGERY Right   . KNEE SURGERY Left    X2  . NEPHRECTOMY Left 2003    Family History  Problem Relation Age of Onset  . Allergies Brother   . Allergies Sister   . Asthma Sister   . Heart disease Mother   . Rheumatologic disease Mother   . Heart disease Father   . Rheumatologic disease Sister   . Cancer Paternal Grandfather     leukemia  . Glaucoma Maternal Grandmother   . Leukemia Maternal Grandfather   .  Heart attack Brother     Allergies  Allergen Reactions  . Niacin And Related Hives  . Oxycodone Hives    Current Outpatient Prescriptions on File Prior to Visit  Medication Sig Dispense Refill  . ALBUTEROL IN Inhale 2 puffs into the lungs as needed.    Marland Kitchen atorvastatin (LIPITOR) 80 MG tablet Take 80 mg by mouth daily.    . cetirizine (ZYRTEC) 10 MG tablet Take 10 mg by mouth daily.    . Cholecalciferol (VITAMIN D-3 PO) Take 2,000 Int'l Units by mouth daily.    . citalopram (CELEXA) 40 MG tablet Take 40 mg by mouth daily.    . fluticasone (FLONASE) 50 MCG/ACT nasal spray Place 2 sprays into both nostrils daily.    Marland Kitchen gabapentin (NEURONTIN) 600 MG tablet Take 600 mg by mouth 4 (four) times daily.     . hydrochlorothiazide (HYDRODIURIL) 25 MG tablet Take 25 mg by mouth daily.    . insulin aspart protamine- aspart (NOVOLOG MIX 70/30) (70-30) 100 UNIT/ML injection Inject 58 Units into the skin 2 (two) times daily with a meal.     . Insulin  Syringe-Needle U-100 (INSULIN SYRINGE .3CC/31GX5/16") 31G X 5/16" 0.3 ML MISC by Does not apply route.    Marland Kitchen ketotifen (ZADITOR) 0.025 % ophthalmic solution Place 1 drop into both eyes 2 (two) times daily.     . Lancets (ONETOUCH ULTRASOFT) lancets Use as instructed 100 each 2  . levothyroxine (SYNTHROID, LEVOTHROID) 50 MCG tablet TAKE 1 TABLET BY MOUTH DAILY 30 tablet 5  . lidocaine (LMX) 4 % cream Apply 1 application topically as needed.    . metFORMIN (GLUCOPHAGE-XR) 500 MG 24 hr tablet Take 1,000 mg by mouth 2 (two) times daily.     . modafinil (PROVIGIL) 200 MG tablet Take 1 tablet (200 mg total) by mouth every morning. 30 tablet 3  . mometasone (ASMANEX) 220 MCG/INH inhaler Inhale 2 puffs into the lungs at bedtime.    . mupirocin ointment (BACTROBAN) 2 % Place 1 application into the nose 2 (two) times daily.    . ONE TOUCH ULTRA TEST test strip USE AS INSTRUCTED TO CHECK BLOOD SUGAR THREE TIMES DAILY. 100 each 5  . pantoprazole (PROTONIX) 40 MG tablet Take 40 mg by mouth daily.    . promethazine (PHENERGAN) 25 MG tablet Take 25 mg by mouth every 6 (six) hours as needed for nausea or vomiting.    . ranitidine (ZANTAC) 300 MG capsule Take 300 mg by mouth every evening.    . sildenafil (VIAGRA) 100 MG tablet Take 100 mg by mouth as needed for erectile dysfunction.    . SUMAtriptan (IMITREX) 100 MG tablet Take 100 mg by mouth every 2 (two) hours as needed for migraine. May repeat in 2 hours if headache persists or recurs.    . tamsulosin (FLOMAX) 0.4 MG CAPS capsule Take 1 capsule (0.4 mg total) by mouth daily. 30 capsule 5  . testosterone (ANDROGEL) 50 MG/5GM (1%) GEL Place 5 g onto the skin daily.     No current facility-administered medications on file prior to visit.     BP (!) 133/95 (BP Location: Right Arm, Cuff Size: Large)   Pulse 97   Temp 98.1 F (36.7 C) (Oral)   Resp 16   Ht 5\' 5"  (1.651 m)   Wt 232 lb 6.4 oz (105.4 kg)   SpO2 98% Comment: room air  BMI 38.67 kg/m         Objective:   Physical Exam  Constitutional: He is oriented to person, place, and time. He appears well-developed and well-nourished. No distress.  HENT:  Head: Normocephalic and atraumatic.  Cardiovascular: Normal rate and regular rhythm.   No murmur heard. Pulmonary/Chest: Effort normal and breath sounds normal. No respiratory distress. He has no wheezes. He has no rales.  Musculoskeletal: He exhibits no edema.  Neurological: He is alert and oriented to person, place, and time.  Skin: Skin is warm and dry.  Psychiatric: He has a normal mood and affect. His behavior is normal. Thought content normal.          Assessment & Plan:  Letter was provided excusing him from [employer].

## 2017-02-05 NOTE — Assessment & Plan Note (Signed)
Following with Dr. [name] and maintained on androgel.

## 2017-02-05 NOTE — Progress Notes (Signed)
Pre visit review using our clinic review tool, if applicable. No additional management support is needed unless otherwise documented below in the visit note. 

## 2017-02-05 NOTE — Assessment & Plan Note (Signed)
Stable on synthroid, obtain follow up tsh.  

## 2017-02-05 NOTE — Assessment & Plan Note (Signed)
Stable, management per Dr. Murlean Iba.

## 2017-02-06 LAB — HEMOGLOBIN A1C
Hgb A1c MFr Bld: 8.2 % — ABNORMAL HIGH (ref <5.7)
MEAN PLASMA GLUCOSE: 189 mg/dL

## 2017-02-07 ENCOUNTER — Telehealth: Payer: Self-pay | Admitting: Family

## 2017-02-07 DIAGNOSIS — R7989 Other specified abnormal findings of blood chemistry: Secondary | ICD-10-CM

## 2017-02-07 DIAGNOSIS — R945 Abnormal results of liver function studies: Secondary | ICD-10-CM

## 2017-02-07 NOTE — Telephone Encounter (Signed)
See mychart.  

## 2017-02-10 ENCOUNTER — Ambulatory Visit (HOSPITAL_BASED_OUTPATIENT_CLINIC_OR_DEPARTMENT_OTHER): Payer: Medicare Other

## 2017-02-17 ENCOUNTER — Encounter (HOSPITAL_BASED_OUTPATIENT_CLINIC_OR_DEPARTMENT_OTHER): Payer: Self-pay

## 2017-02-17 ENCOUNTER — Ambulatory Visit (HOSPITAL_BASED_OUTPATIENT_CLINIC_OR_DEPARTMENT_OTHER): Admission: RE | Admit: 2017-02-17 | Payer: Medicare Other | Source: Ambulatory Visit

## 2017-02-19 ENCOUNTER — Telehealth: Payer: Self-pay | Admitting: Family

## 2017-02-19 NOTE — Telephone Encounter (Signed)
-----   Message from Katha Hamming sent at 02/17/2017  2:03 PM EST ----- Regarding: [place] abd Voyd had eaten prior to his scheduled ultrasound appointment today.  After talking with the technologist, he stated he had an ultrasound at the [facility] in November and said he was going to get that report and bring it to your office.   Thanks, Hoyle Sauer

## 2017-02-25 ENCOUNTER — Ambulatory Visit (INDEPENDENT_AMBULATORY_CARE_PROVIDER_SITE_OTHER): Payer: Medicare Other | Admitting: Medical

## 2017-02-25 ENCOUNTER — Ambulatory Visit (HOSPITAL_BASED_OUTPATIENT_CLINIC_OR_DEPARTMENT_OTHER)
Admission: RE | Admit: 2017-02-25 | Discharge: 2017-02-25 | Disposition: A | Discharge status: Home / Self Care | Payer: Medicare Other | Source: Ambulatory Visit | Attending: Medical | Admitting: Medical

## 2017-02-25 VITALS — Temp 98.6°F | Ht 65.5 in | Wt 227.6 lb

## 2017-02-25 DIAGNOSIS — R0602 Shortness of breath: Secondary | ICD-10-CM | POA: Insufficient documentation

## 2017-02-25 DIAGNOSIS — H9201 Otalgia, right ear: Secondary | ICD-10-CM

## 2017-02-25 DIAGNOSIS — R062 Wheezing: Secondary | ICD-10-CM

## 2017-02-25 DIAGNOSIS — R05 Cough: Secondary | ICD-10-CM

## 2017-02-25 DIAGNOSIS — J0110 Acute frontal sinusitis, unspecified: Secondary | ICD-10-CM | POA: Diagnosis not present

## 2017-02-25 DIAGNOSIS — J029 Acute pharyngitis, unspecified: Secondary | ICD-10-CM | POA: Diagnosis not present

## 2017-02-25 DIAGNOSIS — J9811 Atelectasis: Secondary | ICD-10-CM | POA: Insufficient documentation

## 2017-02-25 DIAGNOSIS — R059 Cough, unspecified: Secondary | ICD-10-CM

## 2017-02-25 LAB — POCT RAPID STREP A (OFFICE): RAPID STREP A SCREEN: NEGATIVE

## 2017-02-25 MED ORDER — PREDNISONE 10 MG PO TABS
ORAL_TABLET | ORAL | 0 refills | Status: DC
Start: 2017-02-25 — End: 2017-03-10

## 2017-02-25 MED ORDER — AMOXICILLIN-POT CLAVULANATE 875-125 MG PO TABS: 1.0000 | ORAL_TABLET | Freq: Two times a day (BID) | ORAL | 0 refills | Status: DC

## 2017-02-25 MED ORDER — BENZONATATE 100 MG PO CAPS: 100.0000 mg | ORAL_CAPSULE | Freq: Three times a day (TID) | ORAL | 0 refills | Status: DC | PRN

## 2017-02-25 MED FILL — predniSONE 10 MG TABS: 10 | 5 days supply | Qty: 15 | Fill #0

## 2017-02-25 MED FILL — LEVOTHYROXINE 50 MCG TABLET: 50 | 30 days supply | Qty: 30 | Fill #1

## 2017-02-25 MED FILL — TAMSULOSIN HCL 0.4 MG CAP: 0.4 | 30 days supply | Qty: 30 | Fill #5

## 2017-02-25 MED FILL — BENZONATATE 100 MG CAP: 100 | 7 days supply | Qty: 21 | Fill #0

## 2017-02-25 MED FILL — AMOX-CLAV 875-125 MG TABLET: 875-125 | 10 days supply | Qty: 20 | Fill #0

## 2017-02-25 NOTE — Progress Notes (Signed)
Pre visit review using our clinic tool,if applicable. No additional management support is needed unless otherwise documented below in the visit note.  

## 2017-02-25 NOTE — Patient Instructions (Addendum)
Your rapid strep test was negative but throat looks moderate supicious. Also some concern for sinus infection and ear infection so will rx augmentin.  Rx benzonatate for cough.  For wheezing continue your 2 inhalers and your 3 allergy medications. Please no yard work presently so asthma won't flare. I do want you to get cxr today. You might need to use taper dose prednisone if you have constant wheezing as your described last night. However  your sugars would likely go up in that event. So if you do have to take prednisone over weekend then bump up insulin by 2 units. If you can't contact your endocrinoligst you could call me as well [phone] or you could call Doctor on call.  Follow guidelines of your endocrinologist as she explained when sugars increase.  Follow up in 7 days or as needed

## 2017-02-25 NOTE — Progress Notes (Signed)
Subjective:    Patient ID: [name], male    DOB: 09/08/1961, 56 y.o.   MRN: [id number]  HPI   Pt in feelng sick for about 2.5 days. States started with rt ear pain,  with some pain rt submandibular area pain, sinus pressure and some st as well. Some pain on swollowing.  Pt also states just recently he feels like his asthma flared just last night and the night before(described moderate severe). Pt has sleep apnea as well. Pt uses bipap. Pt is on asmanex and albuterol inhaler. Pt last 2 days was doing some yard work. He is not sob presently or wheezing.   Pt is diabetic. Pt uses 58 units novolog 70/30  and is on metformin.Pt sees endocrinologist at the [place]. Pt last a1c 8.2 earlier this month.   Pt used to be on extensive regimen of lantus, glipizide, metformin and novolog flex pen.    Pt blood sugar this am was 187.     Review of Systems  Constitutional: Negative for chills and fatigue.  HENT: Positive for congestion and sore throat. Negative for sinus pressure.   Respiratory: Positive for cough and wheezing.        Wheezing at night last 2 nights but not now.  Cardiovascular: Negative for chest pain and palpitations.  Gastrointestinal: Negative for abdominal pain, constipation, diarrhea and vomiting.  Musculoskeletal: Negative for arthralgias and back pain.  Skin: Negative for rash.  Neurological: Negative for dizziness and headaches.  Hematological: Negative for adenopathy. Does not bruise/bleed easily.  Psychiatric/Behavioral: Negative for behavioral problems, confusion and suicidal ideas. The patient is not nervous/anxious.     Past Medical History:  Diagnosis Date  . Asthma   . Cancer of kidney ([place])    s/p L nephrectomy (renal cell carcinoma)  . Carpal tunnel syndrome   . Compression fracture of lumbar vertebra ([place])   . Deviated septum   . Diabetes ([place])   . Hepatitis A 1980  . Hypercholesteremia   . Hypertension   . Kidney disease   . Sinus congestion    . Sleep apnea      Social History   Social History  . Marital status: Married    Spouse name: N/A  . Number of children: N/A  . Years of education: N/A   Occupational History  . Not on file.   Social History Main Topics  . Smoking status: Former Smoker    Packs/day: 1.00    Years: 10.00    Types: Cigarettes    Quit date: 12/28/1978  . Smokeless tobacco: Never Used  . Alcohol use Not on file  . Drug use: Unknown  . Sexual activity: Not on file   Other Topics Concern  . 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 ANKLE SURGERY    . BACK SURGERY  2007  . HAND SURGERY Right   . KNEE SURGERY Left    X2  . NEPHRECTOMY Left 2003    Family History  Problem Relation Age of Onset  . Allergies Brother   . Allergies Sister   . Asthma Sister   . Heart disease Mother   .  Rheumatologic disease Mother   . Heart disease Father   . Rheumatologic disease Sister   . Cancer Paternal Grandfather     leukemia  . Glaucoma Maternal Grandmother   . Leukemia Maternal Grandfather   . Heart attack Brother     Allergies  Allergen Reactions  . Hydrocodone Itching  . Niacin And Related Hives    Current Outpatient Prescriptions on File Prior to Visit  Medication Sig Dispense Refill  . ALBUTEROL IN Inhale 2 puffs into the lungs as needed.    Marland Kitchen atorvastatin (LIPITOR) 80 MG tablet Take 80 mg by mouth daily.    . cetirizine (ZYRTEC) 10 MG tablet Take 10 mg by mouth daily.    . Cholecalciferol (VITAMIN D-3 PO) Take 2,000 Int'l Units by mouth daily.    . citalopram (CELEXA) 40 MG tablet Take 40 mg by mouth daily.    . fluticasone (FLONASE) 50 MCG/ACT nasal spray Place 2 sprays  into both nostrils daily.    Marland Kitchen gabapentin (NEURONTIN) 600 MG tablet Take 600 mg by mouth 4 (four) times daily.     . hydrochlorothiazide (HYDRODIURIL) 25 MG tablet Take 25 mg by mouth daily.    . insulin aspart protamine- aspart (NOVOLOG MIX 70/30) (70-30) 100 UNIT/ML injection Inject 58 Units into the skin 2 (two) times daily with a meal.     . Insulin Syringe-Needle U-100 (INSULIN SYRINGE .3CC/31GX5/16") 31G X 5/16" 0.3 ML MISC by Does not apply route.    Marland Kitchen ketotifen (ZADITOR) 0.025 % ophthalmic solution Place 1 drop into both eyes 2 (two) times daily.     . Lancets (ONETOUCH ULTRASOFT) lancets Use as instructed 100 each 2  . levothyroxine (SYNTHROID, LEVOTHROID) 50 MCG tablet TAKE 1 TABLET BY MOUTH DAILY 30 tablet 5  . lidocaine (LMX) 4 % cream Apply 1 application topically as needed.    . Lidocaine HCl (ASPERCREME LIDOCAINE) 4 % LIQD Apply 1 application topically daily as needed.    Marland Kitchen losartan (COZAAR) 25 MG tablet Take 25 mg by mouth daily.    . metFORMIN (GLUCOPHAGE-XR) 500 MG 24 hr tablet Take 1,000 mg by mouth 2 (two) times daily.     . modafinil (PROVIGIL) 200 MG tablet Take 1 tablet (200 mg total) by mouth every morning. 30 tablet 3  . mometasone (ASMANEX) 220 MCG/INH inhaler Inhale 2 puffs into the lungs at bedtime.    . mupirocin ointment (BACTROBAN) 2 % Place 1 application into the nose 2 (two) times daily.    . ONE TOUCH ULTRA TEST test strip USE AS INSTRUCTED TO CHECK BLOOD SUGAR THREE TIMES DAILY. 100 each 5  . pantoprazole (PROTONIX) 40 MG tablet Take 40 mg by mouth daily.    . promethazine (PHENERGAN) 25 MG tablet Take 25 mg by mouth every 6 (six) hours as needed for nausea or vomiting.    . ranitidine (ZANTAC) 300 MG capsule Take 300 mg by mouth every evening.    . SUMAtriptan (IMITREX) 100 MG tablet Take 100 mg by mouth every 2 (two) hours as needed for migraine. May repeat in 2 hours if headache persists or recurs.    . tamsulosin (FLOMAX) 0.4 MG CAPS capsule Take 1 capsule  (0.4 mg total) by mouth daily. 30 capsule 5  . testosterone (ANDROGEL) 50 MG/5GM (1%) GEL Place 5 g onto the skin daily.     No current facility-administered medications on file prior to visit.     Temp 98.6 F (37 C) (Oral)   Ht 5' 5.5" (  1.664 m)   Wt 227 lb 9.6 oz (103.2 kg)   SpO2 96% Comment: USES BIPAP  BMI 37.30 kg/m       Objective:   Physical Exam  General  Mental Status - Alert. General Appearance - Well groomed. Not in acute distress.  Skin Rashes- No Rashes.  HEENT Head- Normal. Ear Auditory Canal - Left- Normal. Right - Normal.Tympanic Membrane- Left- Normal. Right- moderate dull and red. Eye Sclera/Conjunctiva- Left- Normal. Right- Normal. Nose & Sinuses Nasal Mucosa- Left-  Boggy and Congested. Right-  Boggy and  Congested.Bilateral maxillary and frontal sinus pressure. Mouth & Throat Lips: Upper Lip- Normal: no dryness, cracking, pallor, cyanosis, or vesicular eruption. Lower Lip-Normal: no dryness, cracking, pallor, cyanosis or vesicular eruption. Buccal Mucosa- Bilateral- No Aphthous ulcers. Oropharynx- No Discharge or Erythema. Tonsils: Characteristics- Bilateral- moderate  Erythema or Congestion. Size/Enlargement- Bilateral- No enlargement. Discharge- bilateral-None.  Neck Neck- Supple. No Masses.   Chest and Lung Exam Auscultation: Breath Sounds:-Clear even and unlabored.  Cardiovascular Auscultation:Rythm- Regular, rate and rhythm. Murmurs & Other Heart Sounds:Ausculatation of the heart reveal- No Murmurs.  Lymphatic Head & Neck General Head & Neck Lymphatics: Bilateral: Description- No Localized lymphadenopathy.       Assessment & Plan:  Your rapid strep test was negative but throat looks moderate supicious. Also some concern for sinus infection and ear infection so will rx augmentin.  Rx benzonatate for cough.  For wheezing continue your 2 inhalers and your 3 allergy medications. Please no yard work presently so asthma won't flare.  I do want you to get cxr today. You might need to use taper dose prednisone if you have constant wheezing as your described last night. However your sugars would likely go up in that event. So if you do have to take prednisone over weekend then bump up insulin by 2 units. If you can't contact your endocrinoligst you could call me as well [phone] or you could call Doctor on call  Follow guidelines of your endocrinologist as she explained when sugars increase.  Follow up in 7 days or as needed  Wreatha Sturgeon, Percell Miller, [employer]

## 2017-03-03 NOTE — Telephone Encounter (Signed)
Received copies of liver u/s from 10/07/16, HIDA scan from 11/29/14 and u/s RUQ of 07/04/14 and forwarded to PCP fo review.

## 2017-03-05 MED FILL — MODAFINIL 200 MG TABLET: 200 | 30 days supply | Qty: 30 | Fill #1

## 2017-03-05 MED FILL — ONE TOUCH ULTRA TEST STRIPS: 34 days supply | Qty: 100 | Fill #1

## 2017-03-08 ENCOUNTER — Ambulatory Visit: Payer: Medicare Other | Admitting: Pulmonary Disease

## 2017-03-08 ENCOUNTER — Encounter: Payer: Self-pay | Admitting: Family

## 2017-03-08 DIAGNOSIS — K760 Fatty (change of) liver, not elsewhere classified: Secondary | ICD-10-CM | POA: Insufficient documentation

## 2017-03-08 HISTORY — DX: Fatty (change of) liver, not elsewhere classified: K76.0

## 2017-03-09 ENCOUNTER — Other Ambulatory Visit: Payer: Self-pay

## 2017-03-09 DIAGNOSIS — G4733 Obstructive sleep apnea (adult) (pediatric): Secondary | ICD-10-CM

## 2017-03-10 ENCOUNTER — Encounter: Payer: Self-pay | Admitting: Pulmonary Disease

## 2017-03-10 ENCOUNTER — Ambulatory Visit (INDEPENDENT_AMBULATORY_CARE_PROVIDER_SITE_OTHER): Payer: Medicare Other | Admitting: Pulmonary Disease

## 2017-03-10 DIAGNOSIS — G4710 Hypersomnia, unspecified: Secondary | ICD-10-CM

## 2017-03-10 DIAGNOSIS — E6609 Other obesity due to excess calories: Secondary | ICD-10-CM | POA: Diagnosis not present

## 2017-03-10 DIAGNOSIS — Z6837 Body mass index (BMI) 37.0-37.9, adult: Secondary | ICD-10-CM

## 2017-03-10 DIAGNOSIS — J45909 Unspecified asthma, uncomplicated: Secondary | ICD-10-CM | POA: Diagnosis not present

## 2017-03-10 DIAGNOSIS — G4730 Sleep apnea, unspecified: Secondary | ICD-10-CM

## 2017-03-10 DIAGNOSIS — G4733 Obstructive sleep apnea (adult) (pediatric): Secondary | ICD-10-CM

## 2017-03-10 NOTE — Progress Notes (Signed)
Subjective:    Patient ID: [name], male    DOB: Dec 01, 1961, 56 y.o.   MRN: [id number]  HPI   This is the case of [name], 56 y.o. Male, who was referred by [name] in consultation regarding OSA.   As you very well know, patient is a non smoker, diagnosed with asthma controlled with asmanex BID and alb pr.  He was diagnosed with OSA in 2002.  RDI was 68. He had snoring, gasping, choking, witnessed apneas, unrefreshed sleep. He was placed on cpap  He felt better with cpap but still had slepeiness.  He had a rpt sleep study at the [place] in [place] in 2015 and that showed  He needed bipap. He is on autobipap  ipap max 20, epap min 13. He feels better using it. Has a FFM.  He gets supplies from the [place].   Download the last month (Jan 2018) 100%, AHI 2.1.   Patient remmains sleep despite using bipap.  He sleeps 7 hrs/night. He uses bipap at HS.  His hypersomnia improved initially but has gotten worse.  ESS 15.  He is a very sleepy person and is very symptomatic.  Wife complains of sleepiness.   He is on disability.   He has been on neurontin since 2009.  He has severe neuropathy.  His neurontin dose was increased recently. He denies taking benzos and opiates. He denies drinking ETOH frequently. He has gotten sleepier since the Neurontin dose was increased.   ROV 03/10/2017 Patient returns to the office as follow-up for sleep apnea. Since last seen, he continues to use his auto BiPAP machine. He feels better using it. More energy. Download the last month: 100%, AHI of 3.6. He was also started on modafinil, 20 mg tab, 1 tab daily. Hypersomnia is better. He had a recent bronchitis and asthma flare but is significantly improved. He is almost back to baseline.  Review of Systems  Constitutional: Negative.  Negative for fever and unexpected weight change.  HENT: Negative for congestion, dental problem, ear pain, nosebleeds, postnasal drip, rhinorrhea, sinus  pressure, sneezing, sore throat and trouble swallowing.   Eyes: Negative for redness and itching.  Respiratory: Positive for shortness of breath. Negative for cough, chest tightness and wheezing.   Cardiovascular: Negative.  Negative for palpitations and leg swelling.  Gastrointestinal: Negative for nausea and vomiting.  Endocrine: Negative.   Genitourinary: Negative.  Negative for dysuria.  Musculoskeletal: Negative.  Negative for joint swelling.  Skin: Negative.  Negative for rash.  Allergic/Immunologic: Negative.   Neurological: Negative for headaches.  Hematological: Negative.  Does not bruise/bleed easily.  Psychiatric/Behavioral: Negative.  Negative for dysphoric mood. The patient is not nervous/anxious.       Objective:   Physical Exam   Vitals:  Vitals:   03/10/17 1153  BP: 102/70  Pulse: 92  SpO2: 98%  Weight: 227 lb 3.2 oz (103.1 kg)  Height: 5' 5.5" (1.664 m)    Constitutional/General:  Pleasant, well-nourished, well-developed, not in any distress,  Comfortably seating.  Well kempt  Body mass index is 37.23 kg/m. Wt Readings from Last 3 Encounters:  03/10/17 227 lb 3.2 oz (103.1 kg)  02/25/17 227 lb 9.6 oz (103.2 kg)  02/05/17 232 lb 6.4 oz (105.4 kg)     HEENT: Pupils equal and reactive to light and accommodation. Anicteric sclerae. Normal nasal mucosa.   No oral  lesions,  mouth clear,  oropharynx clear, no postnasal drip. (-) Oral thrush. No dental  caries.  Airway - Mallampati class IV  Neck: No masses. Midline trachea. No JVD, (-) LAD. (-) bruits appreciated.  Respiratory/Chest: Grossly normal chest. (-) deformity. (-) Accessory muscle use.  Symmetric expansion. (-) Tenderness on palpation.  Resonant on percussion.  Diminished BS on both lower lung zones. (-) wheezing, crackles, rhonchi (-) egophony  Cardiovascular: Regular rate and  rhythm, heart sounds normal, no murmur or gallops, no peripheral edema  Gastrointestinal:  Normal bowel sounds.  Soft, non-tender. No hepatosplenomegaly.  (-) masses.   Musculoskeletal:  Normal muscle tone. Normal gait.   Extremities: Grossly normal. (-) clubbing, cyanosis.  (-) edema  Skin: (-) rash,lesions seen.   Neurological/Psychiatric : alert, oriented to time, place, person. Normal mood and affect         Assessment & Plan:  OSA (obstructive sleep apnea) Pt was diagnosed with OSA in 2002.  RDI was 68. He had snoring, gasping, choking, witnessed apneas, unrefreshed sleep. He was placed on cpap  He felt better with cpap but still had slepeiness.  He had a rpt sleep study at the [place] in [place] in 2015 and that showed  He needed bipap. He is on autobipap  ipap max 20, epap min 13. He feels better using it. Has a FFM.  He gets supplies from the [place].   Download the last month (Jan 2018) 100%, AHI 2.1.  Download the last month (February 2018): 100%, AHI 3.6.  Hypersomnia is significantly  Better on Modafinil + autoBiPaP.   Pt has been a sleepy person but significantly better on autobipap and modafinil.  ABG did NOT show hypercapnea.   He has been on neurontin since 2009.  He has severe neuropathy.     Plan :  We extensively discussed the importance of treating OSA and the need to use PAP therapy.   Continue with autobipap. Good compliance and OSA is corrected.  IPAP max 20 cm water,  EPAP min 13 cm water. Good compliance and download.     Patient was instructed to have mask, tubings, filter, reservoir cleaned at least once a week with soapy water.  Patient was instructed to call the office if he/she is having issues with the PAP device.    I advised patient to obtain sufficient amount of sleep --  7 to 8 hours at least in a 24 hr period.  Patient was advised to follow good sleep hygiene.  Patient was advised NOT to engage in activities requiring concentration and/or vigilance if he/she is and  sleepy.  Patient is NOT to drive if he/she is sleepy.   Obesity Weight  reduction  Asthma Stable on asmanex BID and alb prn.  He gest asmanex from the [place]. Recent asthma flare but better. Almost at baseline.  D/w wife re: [facility]  Hypersomnia with sleep apnea Hypersomnia is better. Good compliance. He is is modafinil, 200 mg in am.  D/w pt re: side effects of modafinil.  Cont  Modafinil at 200 mg in am.      Patient will follow up in 6 months.     Monica Becton, MD 03/10/2017   12:39 PM [facility] [place] Pager: [phone] Office: [phone], Fax: [phone]

## 2017-03-10 NOTE — Patient Instructions (Signed)
  It was a pleasure taking care of you today!  Continue using your BiPAP machine.  Continue taking Modafinil 200 mg/tab, 1 tab daily.   Please make sure you use your BiPAP device everytime you sleep.  We will monitor the usage of your machine per your insurance requirement.  Your insurance company may take the machine from you if you are not using it regularly.   Please clean the mask, tubings, filter, water reservoir with soapy water every week.  Please use distilled water for the water reservoir.   Please call the office or your machine provider (DME company) if you are having issues with the device.   Return to clinic in 6 months   with NP/APP

## 2017-03-10 NOTE — Assessment & Plan Note (Signed)
>>  ASSESSMENT AND PLAN FOR ASTHMA WRITTEN ON 03/10/2017 12:38 PM BY DE DIOS, JOSE ANGELO A, MD  Stable on asmanex BID and alb prn.  He gest asmanex from the [place]. Recent asthma flare but better. Almost at baseline.  D/w wife re: trying breo if given by the [facility]

## 2017-03-10 NOTE — Assessment & Plan Note (Signed)
Weight reduction 

## 2017-03-10 NOTE — Assessment & Plan Note (Addendum)
Hypersomnia is better. Good compliance. He is is modafinil, 200 mg in am.  D/w pt re: side effects of modafinil.  Cont  Modafinil at 200 mg in am.

## 2017-03-10 NOTE — Assessment & Plan Note (Addendum)
Stable on asmanex BID and alb prn.  He gest asmanex from the [place]. Recent asthma flare but better. Almost at baseline.  D/w wife re: trying breo if given by the [place]

## 2017-03-10 NOTE — Assessment & Plan Note (Addendum)
Pt was diagnosed with OSA in 2002.  RDI was 68. He had snoring, gasping, choking, witnessed apneas, unrefreshed sleep. He was placed on cpap  He felt better with cpap but still had slepeiness.  He had a rpt sleep study at the [place] in [place] in 2015 and that showed  He needed bipap. He is on autobipap  ipap max 20, epap min 13. He feels better using it. Has a FFM.  He gets supplies from the [place].   Download the last month (Jan 2018) 100%, AHI 2.1.  Download the last month (February 2018): 100%, AHI 3.6.  Hypersomnia is significantly  Better on Modafinil + autoBiPaP.   Pt has been a sleepy person but significantly better on autobipap and modafinil.  ABG did NOT show hypercapnea.   He has been on neurontin since 2009.  He has severe neuropathy.     Plan :  We extensively discussed the importance of treating OSA and the need to use PAP therapy.   Continue with autobipap. Good compliance and OSA is corrected.  IPAP max 20 cm water,  EPAP min 13 cm water. Good compliance and download.     Patient was instructed to have mask, tubings, filter, reservoir cleaned at least once a week with soapy water.  Patient was instructed to call the office if he/she is having issues with the PAP device.    I advised patient to obtain sufficient amount of sleep --  7 to 8 hours at least in a 24 hr period.  Patient was advised to follow good sleep hygiene.  Patient was advised NOT to engage in activities requiring concentration and/or vigilance if he/she is and  sleepy.  Patient is NOT to drive if he/she is sleepy.

## 2017-04-01 ENCOUNTER — Other Ambulatory Visit: Payer: Self-pay | Admitting: Family

## 2017-04-01 MED FILL — LEVOTHYROXINE 50 MCG TABLET: 50 | 30 days supply | Qty: 30 | Fill #2

## 2017-04-01 MED FILL — TAMSULOSIN HCL 0.4 MG CAP: 0.4 | 30 days supply | Qty: 30 | Fill #0

## 2017-04-05 ENCOUNTER — Telehealth: Payer: Self-pay | Admitting: Family

## 2017-04-05 NOTE — Telephone Encounter (Signed)
Left pt message asking to call Allison back directly at [phone] to schedule AWV. Thanks! °

## 2017-04-20 MED FILL — ONE TOUCH ULTRA TEST STRIPS: 34 days supply | Qty: 100 | Fill #2

## 2017-04-20 MED FILL — MODAFINIL 200 MG TABLET: 200 | 30 days supply | Qty: 30 | Fill #2

## 2017-04-26 ENCOUNTER — Telehealth: Payer: Self-pay | Admitting: Family

## 2017-04-26 NOTE — Telephone Encounter (Signed)
Caller name: Cesareo Relation to pt: self Call back number: [phone] Pharmacy: [facility]  Reason for call: Pt is needing refill on ONE TOUCH ULTRA TEST test strip. Please advise.

## 2017-04-26 NOTE — Telephone Encounter (Signed)
Spoke with pharmacy as our record indicates pt should still have refills remaining from 12/2016 Rx. Verified with Opal Sidles in the pharmacy that rx still has 3 refills and was just filled on 04/20/17 and picked up by pt. Notified pt and he verified that he did pick up Rx but it showed he had no refills remaining. Advised him per pharmacy that he still has 3 refills. Pt voices understanding.

## 2017-05-10 MED FILL — LEVOTHYROXINE 50 MCG TABLET: 50 | 30 days supply | Qty: 30 | Fill #3

## 2017-05-25 NOTE — Telephone Encounter (Signed)
Scheduled 06/08/17

## 2017-06-07 NOTE — Progress Notes (Deleted)
Subjective:   [name] is a 56 y.o. male who presents for an Initial Medicare Annual Wellness Visit.  The Patient was informed that the wellness visit is to identify future health risk and educate and initiate measures that can reduce risk for increased disease through the lifespan.   Describes health as fair, good or great?  Review of Systems  No ROS.  Medicare Wellness Visit.     Sleep patterns: {SX; SLEEP PATTERNS:18802::"feels rested on waking","does not get up to void","gets up *** times nightly to void","sleeps *** hours nightly"}.   Home Safety/Smoke Alarms: Feels safe in home. Smoke alarms in place.  Living environment; residence and Firearm Safety: {Rehab home environment / accessibility:30080::"no firearms","firearms stored safely"}. Seat Belt Safety/Bike Helmet: Wears seat belt.   Counseling:   Eye Exam-  Dental-  Male:   CCS- last 12/05/15. Done w/ Ave Filter, 5 year recall. No report on file.       PSA- Not on file.    Objective:    There were no vitals filed for this visit. There is no height or weight on file to calculate BMI.  Current Medications (verified) Outpatient Encounter Prescriptions as of 06/08/2017  Medication Sig  . ALBUTEROL IN Inhale 2 puffs into the lungs as needed.  Marland Kitchen atorvastatin (LIPITOR) 80 MG tablet Take 80 mg by mouth daily.  . cetirizine (ZYRTEC) 10 MG tablet Take 10 mg by mouth daily.  . Cholecalciferol (VITAMIN D-3 PO) Take 2,000 Int'l Units by mouth daily.  . citalopram (CELEXA) 40 MG tablet Take 40 mg by mouth daily.  . fluticasone (FLONASE) 50 MCG/ACT nasal spray Place 2 sprays into both nostrils daily.  Marland Kitchen gabapentin (NEURONTIN) 600 MG tablet Take 600 mg by mouth 4 (four) times daily.   . hydrochlorothiazide (HYDRODIURIL) 25 MG tablet Take 25 mg by mouth daily.  . insulin aspart protamine- aspart (NOVOLOG MIX 70/30) (70-30) 100 UNIT/ML injection Inject 58 Units into the skin 2 (two) times daily with a meal.   . Insulin  Syringe-Needle U-100 (INSULIN SYRINGE .3CC/31GX5/16") 31G X 5/16" 0.3 ML MISC by Does not apply route.  Marland Kitchen ketotifen (ZADITOR) 0.025 % ophthalmic solution Place 1 drop into both eyes 2 (two) times daily.   . Lancets (ONETOUCH ULTRASOFT) lancets Use as instructed  . levothyroxine (SYNTHROID, LEVOTHROID) 50 MCG tablet TAKE 1 TABLET BY MOUTH DAILY  . lidocaine (LMX) 4 % cream Apply 1 application topically as needed.  . Lidocaine HCl (ASPERCREME LIDOCAINE) 4 % LIQD Apply 1 application topically daily as needed.  Marland Kitchen losartan (COZAAR) 25 MG tablet Take 25 mg by mouth daily.  . metFORMIN (GLUCOPHAGE-XR) 500 MG 24 hr tablet Take 1,000 mg by mouth 2 (two) times daily.   . modafinil (PROVIGIL) 200 MG tablet Take 1 tablet (200 mg total) by mouth every morning.  . mometasone (ASMANEX) 220 MCG/INH inhaler Inhale 2 puffs into the lungs at bedtime.  . mupirocin ointment (BACTROBAN) 2 % Place 1 application into the nose 2 (two) times daily.  . ONE TOUCH ULTRA TEST test strip USE AS INSTRUCTED TO CHECK BLOOD SUGAR THREE TIMES DAILY.  . pantoprazole (PROTONIX) 40 MG tablet Take 40 mg by mouth daily.  . promethazine (PHENERGAN) 25 MG tablet Take 25 mg by mouth every 6 (six) hours as needed for nausea or vomiting.  . ranitidine (ZANTAC) 300 MG capsule Take 300 mg by mouth every evening.  . SUMAtriptan (IMITREX) 100 MG tablet Take 100 mg by mouth every 2 (two) hours as  needed for migraine. May repeat in 2 hours if headache persists or recurs.  . tamsulosin (FLOMAX) 0.4 MG CAPS capsule TAKE 1 CAPSULE (0.4 MG TOTAL) BY MOUTH DAILY.  Marland Kitchen testosterone (ANDROGEL) 50 MG/5GM (1%) GEL Place 5 g onto the skin daily.   No facility-administered encounter medications on file as of 06/08/2017.     Allergies (verified) Hydrocodone and Niacin and related   History: Past Medical History:  Diagnosis Date  . Asthma   . Cancer of kidney ([place])    s/p L nephrectomy (renal cell carcinoma)  . Carpal tunnel syndrome   . Compression  fracture of lumbar vertebra ([place])   . Deviated septum   . Diabetes ([place])   . Fatty liver 03/08/2017  . Hepatitis A 1980  . Hypercholesteremia   . Hypertension   . Kidney disease   . Sinus congestion   . Sleep apnea    Past Surgical History:  Procedure Laterality Date  . ANKLE SURGERY    . BACK SURGERY  2007  . HAND SURGERY Right   . KNEE SURGERY Left    X2  . NEPHRECTOMY Left 2003   Family History  Problem Relation Age of Onset  . Allergies Brother   . Allergies Sister   . Asthma Sister   . Heart disease Mother   . Rheumatologic disease Mother   . Heart disease Father   . Rheumatologic disease Sister   . Cancer Paternal Grandfather        leukemia  . Glaucoma Maternal Grandmother   . Leukemia Maternal Grandfather   . Heart attack Brother    Social History   Occupational History  . Not on file.   Social History Main Topics  . Smoking status: Former Smoker    Packs/day: 1.00    Years: 10.00    Types: Cigarettes    Quit date: 12/28/1978  . Smokeless tobacco: Never Used  . Alcohol use Not on file  . Drug use: Unknown  . Sexual activity: Not on file   Tobacco Counseling Counseling given: Not Answered   Activities of Daily Living No flowsheet data found.  Immunizations and Health Maintenance Immunization History  Administered Date(s) Administered  . Influenza,inj,Quad PF,36+ Mos 12/10/2015, 08/28/2016  . Influenza-Unspecified 12/28/2013  . Td 12/28/2008   Health Maintenance Due  Topic Date Due  . Hepatitis C Screening  [birth year]  . PNEUMOCOCCAL POLYSACCHARIDE VACCINE (1) 10/10/1964  . HIV Screening  10/10/1977    Patient Care Team: [name], NP as PCP - General (Internal Medicine) Reubin Milan, MD as Referring Physician (Internal Medicine)  Indicate any recent Medical Services you may have received from other than [facility] providers in the past year (date may be approximate).    Assessment:   This is a routine wellness examination for  [place]. Physical assessment deferred to PCP.  Hearing/Vision screen No exam data present  Dietary issues and exercise activities discussed:    Diet (meal preparation, eat out, water intake, caffeinated beverages, dairy products, fruits and vegetables): {Desc; diets:16563} Breakfast: Lunch:  Dinner:      Goals    None     Depression Screen PHQ 2/9 Scores 02/05/2017  PHQ - 2 Score 0  PHQ- 9 Score 9    Fall Risk No flowsheet data found.  Cognitive Function:        Screening Tests Health Maintenance  Topic Date Due  . Hepatitis C Screening  11/01/1961  . PNEUMOCOCCAL POLYSACCHARIDE VACCINE (1) 10/10/1964  . HIV Screening  10/10/1977  .  INFLUENZA VACCINE  07/28/2017  . HEMOGLOBIN A1C  08/05/2017  . OPHTHALMOLOGY EXAM  10/28/2017  . FOOT EXAM  11/27/2017  . TETANUS/TDAP  12/28/2018  . COLONOSCOPY  12/04/2025        Plan:    Follow-up w/ PCP as directed.  I have personally reviewed and noted the following in the patient's chart:   . Medical and social history . Use of alcohol, tobacco or illicit drugs  . Current medications and supplements . Functional ability and status . Nutritional status . Physical activity . Advanced directives . List of other physicians . Vitals . Screenings to include cognitive, depression, and falls . Referrals and appointments  In addition, I have reviewed and discussed with patient certain preventive protocols, quality metrics, and best practice recommendations. A written personalized care plan for preventive services as well as general preventive health recommendations were provided to patient.     Dorrene German, RN   06/07/2017

## 2017-06-08 ENCOUNTER — Encounter: Payer: Self-pay | Admitting: Family

## 2017-06-08 ENCOUNTER — Ambulatory Visit (INDEPENDENT_AMBULATORY_CARE_PROVIDER_SITE_OTHER): Payer: Medicare Other | Admitting: Family

## 2017-06-08 ENCOUNTER — Ambulatory Visit: Payer: Medicare Other | Admitting: *Deleted

## 2017-06-08 VITALS — BP 142/82 | HR 70 | Temp 98.2°F | Resp 16 | Ht 65.0 in | Wt 230.2 lb

## 2017-06-08 DIAGNOSIS — E039 Hypothyroidism, unspecified: Secondary | ICD-10-CM | POA: Diagnosis not present

## 2017-06-08 DIAGNOSIS — I10 Essential (primary) hypertension: Secondary | ICD-10-CM

## 2017-06-08 DIAGNOSIS — E291 Testicular hypofunction: Secondary | ICD-10-CM

## 2017-06-08 DIAGNOSIS — E119 Type 2 diabetes mellitus without complications: Secondary | ICD-10-CM

## 2017-06-08 DIAGNOSIS — Z794 Long term (current) use of insulin: Secondary | ICD-10-CM | POA: Diagnosis not present

## 2017-06-08 DIAGNOSIS — E7800 Pure hypercholesterolemia, unspecified: Secondary | ICD-10-CM

## 2017-06-08 DIAGNOSIS — Z23 Encounter for immunization: Secondary | ICD-10-CM

## 2017-06-08 DIAGNOSIS — E785 Hyperlipidemia, unspecified: Secondary | ICD-10-CM

## 2017-06-08 DIAGNOSIS — Z1159 Encounter for screening for other viral diseases: Secondary | ICD-10-CM

## 2017-06-08 DIAGNOSIS — Z114 Encounter for screening for human immunodeficiency virus [HIV]: Secondary | ICD-10-CM | POA: Diagnosis not present

## 2017-06-08 LAB — BASIC METABOLIC PANEL
BUN: 21 mg/dL (ref 6–23)
CALCIUM: 9.7 mg/dL (ref 8.4–10.5)
CHLORIDE: 103 meq/L (ref 96–112)
CO2: 29 meq/L (ref 19–32)
CREATININE: 1.22 mg/dL (ref 0.40–1.50)
GFR: 65.63 mL/min (ref >60.00)
Glucose, Bld: 181 mg/dL — ABNORMAL HIGH (ref 70–99)
Potassium: 4.4 mEq/L (ref 3.5–5.1)
SODIUM: 136 meq/L (ref 135–145)

## 2017-06-08 LAB — LIPID PANEL
CHOL/HDL RATIO: 4
Cholesterol: 154 mg/dL (ref 0–200)
HDL: 38.1 mg/dL — ABNORMAL LOW (ref >39.00)
LDL Cholesterol: 97 mg/dL (ref 0–99)
NONHDL: 115.83
Triglycerides: 94 mg/dL (ref 0.0–149.0)
VLDL: 18.8 mg/dL (ref 0.0–40.0)

## 2017-06-08 LAB — TSH: TSH: 3.76 u[IU]/mL (ref 0.35–4.50)

## 2017-06-08 NOTE — Assessment & Plan Note (Signed)
Clinically stable on synthroid, obtain follow up a1C.

## 2017-06-08 NOTE — Addendum Note (Signed)
Addended by: Kelle Darting A on: 06/08/2017 06:00 PM   Modules accepted: Orders

## 2017-06-08 NOTE — Patient Instructions (Signed)
Please send me a copy of your lab results from the [place].

## 2017-06-08 NOTE — Assessment & Plan Note (Signed)
Uncontrolled per report of last A1C. He has increased his 70/30 from 58 units bid to 2 units bid and notes that sugars have improved.  Advised pt to work on diet and sent me his lab results from the [place].

## 2017-06-08 NOTE — Assessment & Plan Note (Signed)
Clinically stable on androgel. Management per urology.

## 2017-06-08 NOTE — Assessment & Plan Note (Addendum)
Obtain follow up lipid panel.  

## 2017-06-08 NOTE — Assessment & Plan Note (Signed)
BP slightly above goal but was quite low last visit. Continue current dose of losartan. If still above goal next visit will plan to increase losartan from 25mg  to 50mg .

## 2017-06-08 NOTE — Progress Notes (Signed)
Subjective:    Patient ID: [name], male    DOB: 12/03/1961, 56 y.o.   MRN: [id number]  HPI  [name] is a 56 yr old male who presents today for follow up.  Hypothryoid- maintained on synthroid.  Lab Results  Component Value Date   TSH 1.40 02/05/2017   Hypogonadism- following with urology [name]). He continues androgel.  Feels good since he started androgel.   Hyperlipidemia- maintained on lipitor 80mg .  Lab Results  Component Value Date   CHOL 156 02/05/2017   HDL 33 (L) 02/05/2017   LDLCALC 76 02/05/2017   TRIG 235 (H) 02/05/2017   CHOLHDL 4.7 02/05/2017   DM2- He reports that he had A1C drawn at the [place]. He reports that his LFT's were abnormal as well. He has an appointment with endocrinology in July.  Reports las A1C was 8.2 on 05/10/17.ALT was 84.    Lab Results  Component Value Date   HGBA1C 8.2 (H) 02/05/2017   HGBA1C 7.6 (H) 12/10/2015   HGBA1C (H) 11/07/2010    7.8 (NOTE)                                                                       According to the ADA Clinical Practice Recommendations for 2011, when HbA1c is used as a screening test:   >=6.5%   Diagnostic of Diabetes Mellitus           (if abnormal result  is confirmed)  5.7-6.4%   Increased risk of developing Diabetes Mellitus  References:Diagnosis and Classification of Diabetes Mellitus,Diabetes TFTD,3220,25(KYHCW 1):S62-S69 and Standards of Medical Care in         Diabetes - 2011,Diabetes Care,2011,34  (Suppl 1):S11-S61.   Lab Results  Component Value Date   MICROALBUR 22.5 (H) 12/10/2015   LDLCALC 76 02/05/2017   CREATININE 1.28 02/05/2017    Review of Systems See HPI  Past Medical History:  Diagnosis Date  . Asthma   . Cancer of kidney ([place])    s/p L nephrectomy (renal cell carcinoma)  . Carpal tunnel syndrome   . Compression fracture of lumbar vertebra ([place])   . Deviated septum   . Diabetes ([name])   . Fatty liver 03/08/2017  . Hepatitis A 1980  . Hypercholesteremia     . Hypertension   . Kidney disease   . Sinus congestion   . Sleep apnea      Social History   Social History  . Marital status: Married    Spouse name: N/A  . Number of children: N/A  . Years of education: N/A   Occupational History  . Not on file.   Social History Main Topics  . Smoking status: Former Smoker    Packs/day: 1.00    Years: 10.00    Types: Cigarettes    Quit date: 12/28/1978  . Smokeless tobacco: Never Used  . Alcohol use Not on file  . Drug use: Unknown  . Sexual activity: Not on file   Other Topics Concern  . 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 ANKLE SURGERY    . BACK SURGERY  2007  . HAND SURGERY Right   . KNEE SURGERY Left    X2  . NEPHRECTOMY Left 2003    Family History  Problem Relation Age of Onset  . Allergies Brother   . Allergies Sister   . Asthma Sister   . Heart disease Mother   . Rheumatologic disease Mother   . Heart disease Father   . Rheumatologic disease Sister   . Cancer Paternal Grandfather        leukemia  . Glaucoma Maternal Grandmother   . Leukemia Maternal Grandfather   . Heart attack Brother     Allergies  Allergen Reactions  . Hydrocodone Itching  . Niacin And Related Hives    Current Outpatient Prescriptions on File Prior to Visit  Medication Sig Dispense Refill  . ALBUTEROL IN Inhale 2 puffs into the lungs as needed.    Marland Kitchen atorvastatin (LIPITOR) 80 MG tablet Take 80 mg by mouth daily.    . cetirizine (ZYRTEC) 10 MG tablet Take 10 mg by mouth daily.    . Cholecalciferol (VITAMIN D-3 PO) Take 2,000 Int'l Units by mouth daily.    . citalopram (CELEXA) 40 MG tablet Take 40 mg by mouth daily.    .  fluticasone (FLONASE) 50 MCG/ACT nasal spray Place 2 sprays into both nostrils daily.    Marland Kitchen gabapentin (NEURONTIN) 600 MG tablet Take 600 mg by mouth 4 (four) times daily.     . hydrochlorothiazide (HYDRODIURIL) 25 MG tablet Take 25 mg by mouth daily.    . insulin aspart protamine- aspart (NOVOLOG MIX 70/30) (70-30) 100 UNIT/ML injection Inject 58 Units into the skin 2 (two) times daily with a meal.     . Insulin Syringe-Needle U-100 (INSULIN SYRINGE .3CC/31GX5/16") 31G X 5/16" 0.3 ML MISC by Does not apply route.    Marland Kitchen ketotifen (ZADITOR) 0.025 % ophthalmic solution Place 1 drop into both eyes 2 (two) times daily.     . Lancets (ONETOUCH ULTRASOFT) lancets Use as instructed 100 each 2  . levothyroxine (SYNTHROID, LEVOTHROID) 50 MCG tablet TAKE 1 TABLET BY MOUTH DAILY 30 tablet 5  . lidocaine (LMX) 4 % cream Apply 1 application topically as needed.    . Lidocaine HCl (ASPERCREME LIDOCAINE) 4 % LIQD Apply 1 application topically daily as needed.    Marland Kitchen losartan (COZAAR) 25 MG tablet Take 25 mg by mouth daily.    . metFORMIN (GLUCOPHAGE-XR) 500 MG 24 hr tablet Take 1,000 mg by mouth 2 (two) times daily.     . modafinil (PROVIGIL) 200 MG tablet Take 1 tablet (200 mg total) by mouth every morning. 30 tablet 3  . mometasone (ASMANEX) 220 MCG/INH inhaler Inhale 2 puffs into the lungs at bedtime.    . mupirocin ointment (BACTROBAN) 2 % Place 1 application into the nose 2 (two) times daily.    . ONE TOUCH ULTRA TEST test strip USE AS INSTRUCTED TO CHECK BLOOD SUGAR THREE TIMES DAILY. 100 each 5  . pantoprazole (PROTONIX) 40 MG tablet Take 40 mg by mouth daily.    . promethazine (PHENERGAN) 25 MG tablet Take 25 mg by mouth every 6 (six) hours as needed for nausea or vomiting.    . ranitidine (ZANTAC) 300 MG capsule Take 300 mg by mouth every  evening.    . SUMAtriptan (IMITREX) 100 MG tablet Take 100 mg by mouth every 2 (two) hours as needed for migraine. May repeat in 2 hours if headache persists or recurs.      . tamsulosin (FLOMAX) 0.4 MG CAPS capsule TAKE 1 CAPSULE (0.4 MG TOTAL) BY MOUTH DAILY. 30 capsule 5  . testosterone (ANDROGEL) 50 MG/5GM (1%) GEL Place 5 g onto the skin daily.     No current facility-administered medications on file prior to visit.     BP (!) 142/82 (BP Location: Right Arm, Cuff Size: Large)   Pulse 70   Temp 98.2 F (36.8 C) (Oral)   Resp 16   Ht 5\' 5"  (1.651 m)   Wt 230 lb 3.2 oz (104.4 kg)   SpO2 100%   BMI 38.31 kg/m       Objective:   Physical Exam  Constitutional: He is oriented to person, place, and time. He appears well-developed and well-nourished. No distress.  HENT:  Head: Normocephalic and atraumatic.  Cardiovascular: Normal rate and regular rhythm.   No murmur heard. Pulmonary/Chest: Effort normal and breath sounds normal. No respiratory distress. He has no wheezes. He has no rales.  Musculoskeletal: He exhibits no edema.  Neurological: He is alert and oriented to person, place, and time.  Skin: Skin is warm and dry.  Psychiatric: He has a normal mood and affect. His behavior is normal. Thought content normal.          Assessment & Plan:  Pneumovax today.

## 2017-06-09 LAB — HEPATITIS C ANTIBODY: HCV AB: NEGATIVE

## 2017-06-09 LAB — HIV ANTIBODY (ROUTINE TESTING W REFLEX): HIV 1&2 Ab, 4th Generation: NONREACTIVE

## 2017-06-29 MED FILL — LEVOTHYROXINE 50 MCG TABLET: 50 | 30 days supply | Qty: 30 | Fill #4

## 2017-06-29 MED FILL — ONE TOUCH ULTRA TEST STRIPS: 34 days supply | Qty: 100 | Fill #3

## 2017-06-29 MED FILL — MODAFINIL 200 MG TABLET: 200 | 30 days supply | Qty: 30 | Fill #3

## 2017-08-26 ENCOUNTER — Telehealth: Payer: Self-pay | Admitting: Family

## 2017-08-26 NOTE — Telephone Encounter (Signed)
Caller name: Relation to KQ:ASUO Call back [phone] [facility]  Reason for call: pt is needing rx tamsulosin (FLOMAX) 0.4 MG CAPS capsule, pt states he has not had the med in a month.

## 2017-08-27 MED FILL — TAMSULOSIN HCL 0.4 MG CAP: 0.4 | 30 days supply | Qty: 30 | Fill #1

## 2017-08-27 NOTE — Telephone Encounter (Signed)
Spoke with [place] at [facility] and verified that there is still a refill remaining on his profile and they will fill it for him. Left detailed message on pt's voicemail to that pharmacy is using refill on file and to call if any questions.

## 2017-09-08 ENCOUNTER — Encounter: Payer: Self-pay | Admitting: Family

## 2017-09-08 ENCOUNTER — Ambulatory Visit (INDEPENDENT_AMBULATORY_CARE_PROVIDER_SITE_OTHER): Payer: Medicare Other | Admitting: Family

## 2017-09-08 VITALS — BP 112/82 | HR 111 | Temp 98.4°F | Resp 18 | Ht 65.0 in | Wt 227.2 lb

## 2017-09-08 DIAGNOSIS — M5442 Lumbago with sciatica, left side: Secondary | ICD-10-CM

## 2017-09-08 DIAGNOSIS — N400 Enlarged prostate without lower urinary tract symptoms: Secondary | ICD-10-CM | POA: Diagnosis not present

## 2017-09-08 DIAGNOSIS — I10 Essential (primary) hypertension: Secondary | ICD-10-CM

## 2017-09-08 DIAGNOSIS — Z23 Encounter for immunization: Secondary | ICD-10-CM | POA: Diagnosis not present

## 2017-09-08 DIAGNOSIS — E039 Hypothyroidism, unspecified: Secondary | ICD-10-CM

## 2017-09-08 DIAGNOSIS — Z794 Long term (current) use of insulin: Secondary | ICD-10-CM

## 2017-09-08 DIAGNOSIS — E118 Type 2 diabetes mellitus with unspecified complications: Secondary | ICD-10-CM

## 2017-09-08 NOTE — Progress Notes (Signed)
Subjective:    Patient ID: [name], male    DOB: February 26, 1961, 56 y.o.   MRN: [id number]  HPI  [name] is a 56 yr old male who presents today for follow up.  1) HTN- maintained on losartan and HCTZ.   BP Readings from Last 3 Encounters:  09/08/17 112/82  06/08/17 (!) 142/82  03/10/17 102/70   2) DM2-  He is seeing endo at the [place].  Reports recent increase in his sugars since his steroid pack. Using a sliding scale per endo recommendations.  Lab Results  Component Value Date   HGBA1C 8.2 (H) 02/05/2017   HGBA1C 7.6 (H) 12/10/2015   HGBA1C (H) 11/07/2010    7.8 (NOTE)                                                                       According to the ADA Clinical Practice Recommendations for 2011, when HbA1c is used as a screening test:   >=6.5%   Diagnostic of Diabetes Mellitus           (if abnormal result  is confirmed)  5.7-6.4%   Increased risk of developing Diabetes Mellitus  References:Diagnosis and Classification of Diabetes Mellitus,Diabetes IDPO,2423,53(IRWER 1):S62-S69 and Standards of Medical Care in         Diabetes - 2011,Diabetes Care,2011,34  (Suppl 1):S11-S61.   Lab Results  Component Value Date   MICROALBUR 22.5 (H) 12/10/2015   LDLCALC 97 06/08/2017   CREATININE 1.22 06/08/2017   3) Hypothyroid- maintained on synthroid. Reports feeling well with good compliance.   Lab Results  Component Value Date   TSH 3.76 06/08/2017   [facility] and [facility] MRI which he is waiting ot hear back from them. Was treated with a steroid pack which helped his symptoms.   BPH- reports improvement in his urinary symptoms.   Review of Systems    see HPI  Past Medical History:  Diagnosis Date  . Asthma   . Cancer of kidney ([place])    s/p L nephrectomy (renal cell carcinoma)  . Carpal tunnel syndrome   . Compression fracture of lumbar vertebra ([place])   . Deviated septum   . Diabetes ([place])   . Fatty liver  03/08/2017  . Hepatitis A 1980  . Hypercholesteremia   . Hypertension   . Kidney disease   . Morbid obesity ([place]) 01/06/2017  . Sinus congestion   . Sleep apnea      Social History   Social History  . Marital status: Married    Spouse name: N/A  . Number of children: N/A  . Years of education: N/A   Occupational History  . Not on file.   Social History Main Topics  . Smoking status: Former Smoker    Packs/day: 1.00    Years: 10.00    Types: Cigarettes    Quit date: 12/28/1978  . Smokeless tobacco: Never Used  . Alcohol use Not on file  . Drug use: Unknown  . Sexual activity: Not on file   Other Topics Concern  . 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 ANKLE SURGERY    . BACK SURGERY  2007  . HAND SURGERY Right   . KNEE SURGERY Left    X2  . NEPHRECTOMY Left 2003    Family History  Problem Relation Age of Onset  . Allergies Brother   . Allergies Sister   . Asthma Sister   . Heart disease Mother   . Rheumatologic disease Mother   . Heart disease Father   . Rheumatologic disease Sister   . Cancer Paternal Grandfather        leukemia  . Glaucoma Maternal Grandmother   . Leukemia Maternal Grandfather   . Heart attack Brother     Allergies  Allergen Reactions  . Hydrocodone Itching  . Niacin And Related Hives    Current Outpatient Prescriptions on File Prior to Visit  Medication Sig Dispense Refill  . ALBUTEROL IN Inhale 2 puffs into the lungs as needed.    Marland Kitchen atorvastatin (LIPITOR) 80 MG tablet Take 80 mg by mouth daily.    . cetirizine (ZYRTEC) 10 MG tablet Take 10 mg by mouth daily.    . Cholecalciferol (VITAMIN D-3 PO) Take 2,000 Int'l  Units by mouth daily.    . citalopram (CELEXA) 40 MG tablet Take 40 mg by mouth daily.    . fluticasone (FLONASE) 50 MCG/ACT nasal spray Place 2 sprays into both nostrils daily.    Marland Kitchen gabapentin (NEURONTIN) 600 MG tablet Take 600 mg by mouth 4 (four) times daily.     . hydrochlorothiazide (HYDRODIURIL) 25 MG tablet Take 25 mg by mouth daily.    . insulin aspart protamine- aspart (NOVOLOG MIX 70/30) (70-30) 100 UNIT/ML injection Inject 62 Units into the skin 2 (two) times daily with a meal.    . Insulin Syringe-Needle U-100 (INSULIN SYRINGE .3CC/31GX5/16") 31G X 5/16" 0.3 ML MISC by Does not apply route.    Marland Kitchen ketotifen (ZADITOR) 0.025 % ophthalmic solution Place 1 drop into both eyes 2 (two) times daily.     . Lancets (ONETOUCH ULTRASOFT) lancets Use as instructed 100 each 2  . levothyroxine (SYNTHROID, LEVOTHROID) 50 MCG tablet TAKE 1 TABLET BY MOUTH DAILY 30 tablet 5  . lidocaine (LMX) 4 % cream Apply 1 application topically as needed.    . Lidocaine HCl (ASPERCREME LIDOCAINE) 4 % LIQD Apply 1 application topically daily as needed.    Marland Kitchen losartan (COZAAR) 25 MG tablet Take 25 mg by mouth daily.    . metFORMIN (GLUCOPHAGE-XR) 500 MG 24 hr tablet Take 1,000 mg by mouth 2 (two) times daily.     . modafinil (PROVIGIL) 200 MG tablet Take 1 tablet (200 mg total) by mouth every morning. 30 tablet 3  . mometasone (ASMANEX) 220 MCG/INH inhaler Inhale 2 puffs into the lungs at bedtime.    . mupirocin ointment (BACTROBAN) 2 % Place 1 application into the nose 2 (two) times daily.    . ONE TOUCH ULTRA TEST test strip USE AS INSTRUCTED TO CHECK BLOOD SUGAR THREE TIMES DAILY. 100 each 5  . pantoprazole (PROTONIX) 40 MG tablet Take 40 mg by mouth daily.    . promethazine (PHENERGAN) 25 MG tablet Take 25 mg by mouth every 6 (six) hours as needed for nausea  or vomiting.    . ranitidine (ZANTAC) 300 MG capsule Take 300 mg by mouth every evening.    . SUMAtriptan (IMITREX) 100 MG tablet Take 100 mg by mouth every 2  (two) hours as needed for migraine. May repeat in 2 hours if headache persists or recurs.    . tamsulosin (FLOMAX) 0.4 MG CAPS capsule TAKE 1 CAPSULE (0.4 MG TOTAL) BY MOUTH DAILY. 30 capsule 5  . testosterone (ANDROGEL) 50 MG/5GM (1%) GEL Place 5 g onto the skin daily.     No current facility-administered medications on file prior to visit.     BP 112/82 (BP Location: Left Arm, Cuff Size: Large)   Pulse (!) 111   Temp 98.4 F (36.9 C) (Oral)   Resp 18   Ht 5\' 5"  (1.651 m)   Wt 227 lb 3.2 oz (103.1 kg)   SpO2 97%   BMI 37.81 kg/m    Objective:   Physical Exam  Constitutional: He is oriented to person, place, and time. He appears well-developed and well-nourished. No distress.  HENT:  Head: Normocephalic and atraumatic.  Cardiovascular: Normal rate and regular rhythm.   No murmur heard. Pulmonary/Chest: Effort normal and breath sounds normal. No respiratory distress. He has no wheezes. He has no rales.  Musculoskeletal: He exhibits no edema.  Neurological: He is alert and oriented to person, place, and time.  Skin: Skin is warm and dry.  Psychiatric: He has a normal mood and affect. His behavior is normal. Thought content normal.          Assessment & Plan:  Hypertension-stable on current medication continue same.  Diabetes type 2-this is being managed by endocrinology at the [place]. Defer management endocrinology.  Low back pain-patient reports improvement after Medrol Dosepak. He is awaiting results from the MRI which was performed at the [place].  BPH-stable/improved continue Flomax.  Hypothyroid-clinically stable on Synthroid. Continue same.

## 2017-09-10 ENCOUNTER — Ambulatory Visit: Payer: Medicare Other | Admitting: Adult Health

## 2017-09-16 ENCOUNTER — Ambulatory Visit: Payer: Medicare Other | Admitting: Adult Health

## 2017-10-05 MED FILL — LEVOTHYROXINE 50 MCG TABLET: 50 | 30 days supply | Qty: 30 | Fill #5

## 2017-10-05 MED FILL — TAMSULOSIN HCL 0.4 MG CAP: 0.4 | 30 days supply | Qty: 30 | Fill #2

## 2017-11-10 DIAGNOSIS — M48061 Spinal stenosis, lumbar region without neurogenic claudication: Secondary | ICD-10-CM | POA: Insufficient documentation

## 2018-01-12 LAB — HM DIABETES EYE EXAM

## 2018-02-01 ENCOUNTER — Encounter: Payer: Self-pay | Admitting: Family

## 2018-02-03 MED FILL — TAMSULOSIN HCL 0.4 MG CAPS: 0.4 | 30 days supply | Qty: 30 | Fill #3

## 2018-03-08 ENCOUNTER — Encounter: Payer: Self-pay | Admitting: Family

## 2018-03-08 ENCOUNTER — Ambulatory Visit (INDEPENDENT_AMBULATORY_CARE_PROVIDER_SITE_OTHER): Payer: Medicare Other | Admitting: Family

## 2018-03-08 VITALS — BP 95/60 | HR 70 | Temp 98.1°F | Resp 16 | Ht 65.0 in | Wt 228.0 lb

## 2018-03-08 DIAGNOSIS — G4733 Obstructive sleep apnea (adult) (pediatric): Secondary | ICD-10-CM | POA: Diagnosis not present

## 2018-03-08 DIAGNOSIS — E1129 Type 2 diabetes mellitus with other diabetic kidney complication: Secondary | ICD-10-CM | POA: Diagnosis not present

## 2018-03-08 DIAGNOSIS — E039 Hypothyroidism, unspecified: Secondary | ICD-10-CM

## 2018-03-08 DIAGNOSIS — E785 Hyperlipidemia, unspecified: Secondary | ICD-10-CM | POA: Diagnosis not present

## 2018-03-08 DIAGNOSIS — I10 Essential (primary) hypertension: Secondary | ICD-10-CM | POA: Diagnosis not present

## 2018-03-08 DIAGNOSIS — F418 Other specified anxiety disorders: Secondary | ICD-10-CM | POA: Diagnosis not present

## 2018-03-08 DIAGNOSIS — E1165 Type 2 diabetes mellitus with hyperglycemia: Secondary | ICD-10-CM | POA: Diagnosis not present

## 2018-03-08 DIAGNOSIS — IMO0002 Reserved for concepts with insufficient information to code with codable children: Secondary | ICD-10-CM

## 2018-03-08 LAB — LIPID PANEL
CHOL/HDL RATIO: 4
Cholesterol: 160 mg/dL (ref 0–200)
HDL: 37.4 mg/dL — AB (ref >39.00)
LDL CALC: 95 mg/dL (ref 0–99)
NONHDL: 122.96
TRIGLYCERIDES: 139 mg/dL (ref 0.0–149.0)
VLDL: 27.8 mg/dL (ref 0.0–40.0)

## 2018-03-08 LAB — COMPREHENSIVE METABOLIC PANEL
ALT: 53 U/L (ref 0–53)
AST: 30 U/L (ref 0–37)
Albumin: 4.3 g/dL (ref 3.5–5.2)
Alkaline Phosphatase: 97 U/L (ref 39–117)
BUN: 20 mg/dL (ref 6–23)
CHLORIDE: 101 meq/L (ref 96–112)
CO2: 32 meq/L (ref 19–32)
Calcium: 10.1 mg/dL (ref 8.4–10.5)
Creatinine, Ser: 1.34 mg/dL (ref 0.40–1.50)
GFR: 58.73 mL/min — AB (ref >60.00)
GLUCOSE: 158 mg/dL — AB (ref 70–99)
Potassium: 4 mEq/L (ref 3.5–5.1)
Sodium: 139 mEq/L (ref 135–145)
Total Bilirubin: 0.6 mg/dL (ref 0.2–1.2)
Total Protein: 7.6 g/dL (ref 6.0–8.3)

## 2018-03-08 LAB — TSH: TSH: 2.93 u[IU]/mL (ref 0.35–4.50)

## 2018-03-08 NOTE — Patient Instructions (Signed)
Please complete lab work prior to leaving.   

## 2018-03-08 NOTE — Progress Notes (Signed)
Subjective:    Patient ID: [name], male    DOB: 24-Mar-1961, 57 y.o.   MRN: [id number]  HPI  [name] is a 58 yr old male who presents today for follow up.   HTN- maintained on losartan, hctz.  BP Readings from Last 3 Encounters:  03/08/18 95/60  09/08/17 112/82  06/08/17 (!) 142/82   DM2- sees endo at [facility] last A1C reportedly 8.1 back in December. He will see them at the end of the month.  Lab Results  Component Value Date   HGBA1C 8.2 (H) 02/05/2017   HGBA1C 7.6 (H) 12/10/2015   HGBA1C (H) 11/07/2010    7.8 (NOTE)                                                                       According to the ADA Clinical Practice Recommendations for 2011, when HbA1c is used as a screening test:   >=6.5%   Diagnostic of Diabetes Mellitus           (if abnormal result  is confirmed)  5.7-6.4%   Increased risk of developing Diabetes Mellitus  References:Diagnosis and Classification of Diabetes Mellitus,Diabetes GUYQ,0347,42(VZDGL 1):S62-S69 and Standards of Medical Care in         Diabetes - 2011,Diabetes Care,2011,34  (Suppl 1):S11-S61.   Lab Results  Component Value Date   MICROALBUR 22.5 (H) 12/10/2015   LDLCALC 97 06/08/2017   CREATININE 1.22 06/08/2017   Hyperlipidemia- Maintained on lipitor. Reports that "last time it was high" Lab Results  Component Value Date   CHOL 154 06/08/2017   HDL 38.10 (L) 06/08/2017   LDLCALC 97 06/08/2017   TRIG 94.0 06/08/2017   CHOLHDL 4 06/08/2017   Hypothyroid-  Lab Results  Component Value Date   TSH 3.76 06/08/2017   Depression/anxiety- report both are well controlled with citalopram.   OSA- on bipap  Review of Systems See HPI  Past Medical History:  Diagnosis Date  . Asthma   . Cancer of kidney ([place])    s/p L nephrectomy (renal cell carcinoma)  . Carpal tunnel syndrome   . Compression fracture of lumbar vertebra ([place])   . Deviated septum   . Diabetes ([place])   . Fatty liver 03/08/2017  . Hepatitis A 1980  .  Hypercholesteremia   . Hypertension   . Kidney disease   . Migraine   . Morbid obesity ([place]) 01/06/2017  . Sinus congestion   . Sleep apnea      Social History   Socioeconomic History  . Marital status: Married    Spouse name: Not on file  . Number of children: Not on file  . Years of education: Not on file  . Highest education level: Not on file  Social Needs  . Financial resource strain: Not on file  . Food insecurity - worry: Not on file  . Food insecurity - inability: Not on file  . Transportation needs - medical: Not on file  . Transportation needs - non-medical: Not on file  Occupational History  . Not on file  Tobacco Use  . Smoking status: Former Smoker    Packs/day: 1.00    Years: 10.00    Pack years: 10.00    Types: Cigarettes  Last attempt to quit: 12/28/1978    Years since quitting: 39.2  . Smokeless tobacco: Never Used  Substance and Sexual Activity  . Alcohol use: Not on file  . Drug use: Not on file  . Sexual activity: Not on file  Other Topics Concern  . 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 ANKLE SURGERY    . BACK SURGERY  2007  . HAND SURGERY Right   . KNEE SURGERY Left    X2  . NEPHRECTOMY Left 2003    Family History  Problem Relation Age of Onset  . Glaucoma Maternal Grandmother   . Leukemia Maternal Grandfather   . Heart attack Brother   . Allergies Brother   . Allergies Sister   . Asthma Sister   . Heart disease Mother   . Rheumatologic disease Mother   . Heart disease Father   . Rheumatologic disease Sister   . Cancer Paternal Grandfather        leukemia    Allergies  Allergen Reactions  . Hydrocodone Itching  .  Niacin And Related Hives    Current Outpatient Medications on File Prior to Visit  Medication Sig Dispense Refill  . Acetaminophen-Codeine (TYLENOL WITH CODEINE #3 PO) Take 1 tablet by mouth 3 (three) times daily as needed.    . ALBUTEROL IN Inhale 2 puffs into the lungs as needed.    Marland Kitchen atorvastatin (LIPITOR) 80 MG tablet Take 80 mg by mouth daily.    . cetirizine (ZYRTEC) 10 MG tablet Take 10 mg by mouth daily.    . Cholecalciferol (VITAMIN D-3 PO) Take 2,000 Int'l Units by mouth daily.    . citalopram (CELEXA) 40 MG tablet Take 40 mg by mouth daily.    . fluticasone (FLONASE) 50 MCG/ACT nasal spray Place 2 sprays into both nostrils daily.    Marland Kitchen gabapentin (NEURONTIN) 600 MG tablet Take 600 mg by mouth 4 (four) times daily.     . hydrochlorothiazide (HYDRODIURIL) 25 MG tablet Take 25 mg by mouth daily.    . insulin aspart protamine- aspart (NOVOLOG MIX 70/30) (70-30) 100 UNIT/ML injection Inject 62 Units into the skin 2 (two) times daily with a meal.    . Insulin Syringe-Needle U-100 (INSULIN SYRINGE .3CC/31GX5/16") 31G X 5/16" 0.3 ML MISC by Does not apply route.    Marland Kitchen ketotifen (ZADITOR) 0.025 % ophthalmic solution Place 1 drop into both eyes 2 (two) times daily.     . Lancets (ONETOUCH ULTRASOFT) lancets Use as instructed 100 each 2  . levothyroxine (SYNTHROID, LEVOTHROID) 50 MCG tablet TAKE 1 TABLET BY MOUTH DAILY 30 tablet 5  . Lidocaine HCl (ASPERCREME LIDOCAINE) 4 % LIQD Apply 1 application topically daily as needed.    Marland Kitchen losartan (COZAAR) 25 MG tablet Take 25 mg by mouth daily.    . metFORMIN (GLUCOPHAGE-XR) 500 MG 24 hr tablet Take 1,000 mg by mouth 2 (two) times daily.     . methocarbamol (ROBAXIN) 750 MG tablet Take 750 mg by mouth 3 (three) times daily.    . methylPREDNISolone (MEDROL DOSEPAK) 4 MG TBPK tablet Take by mouth.    . modafinil (PROVIGIL) 200  MG tablet Take 1 tablet (200 mg total) by mouth every morning. 30 tablet 3  . mometasone (ASMANEX) 220 MCG/INH inhaler Inhale 2  puffs into the lungs at bedtime.    . mupirocin ointment (BACTROBAN) 2 % Place 1 application into the nose 2 (two) times daily.    . ONE TOUCH ULTRA TEST test strip USE AS INSTRUCTED TO CHECK BLOOD SUGAR THREE TIMES DAILY. 100 each 5  . pantoprazole (PROTONIX) 40 MG tablet Take 40 mg by mouth daily.    . promethazine (PHENERGAN) 25 MG tablet Take 25 mg by mouth every 6 (six) hours as needed for nausea or vomiting.    . ranitidine (ZANTAC) 300 MG capsule Take 300 mg by mouth every evening.    . SUMAtriptan (IMITREX) 100 MG tablet Take 100 mg by mouth every 2 (two) hours as needed for migraine. May repeat in 2 hours if headache persists or recurs.    . tamsulosin (FLOMAX) 0.4 MG CAPS capsule TAKE 1 CAPSULE (0.4 MG TOTAL) BY MOUTH DAILY. 30 capsule 5  . testosterone (ANDROGEL) 50 MG/5GM (1%) GEL Place 5 g onto the skin daily.     No current facility-administered medications on file prior to visit.     BP 95/60 (BP Location: Left Arm, Patient Position: Sitting, Cuff Size: Large)   Pulse 70   Temp 98.1 F (36.7 C) (Oral)   Resp 16   Ht 5\' 5"  (1.651 m)   Wt 228 lb (103.4 kg)   SpO2 95%   BMI 37.94 kg/m       Objective:   Physical Exam  Constitutional: He is oriented to person, place, and time. He appears well-developed and well-nourished. No distress.  HENT:  Head: Normocephalic and atraumatic.  Cardiovascular: Normal rate and regular rhythm.  No murmur heard. Pulmonary/Chest: Effort normal and breath sounds normal. No respiratory distress. He has no wheezes. He has no rales.  Musculoskeletal: He exhibits no edema.  Neurological: He is alert and oriented to person, place, and time.  Skin: Skin is warm and dry.  Psychiatric: He has a normal mood and affect. His behavior is normal. Thought content normal.          Assessment & Plan:  Hypertension-blood pressure is a bit on the low side today.  He is asymptomatic.  Will monitor for now.  Hyperlipidemia- lipid panel is obtained  today and LDL is at goal.  Continue statin.  Hypothyroidism-TSH is within normal limits.  Continue Synthroid.  Diabetes type 2- this is being managed by endocrinology.  Defer management to Endo.  Depression/anxiety-reports both are stable.  He continues current dose of citalopram this is also being managed by the [place].  Obstructive sleep apnea-she is continued on BiPAP.  This is being managed by the [place].

## 2018-03-21 MED FILL — TAMSULOSIN HCL 0.4 MG CAP: 0.4 | 30 days supply | Qty: 30 | Fill #4

## 2018-04-05 ENCOUNTER — Encounter: Payer: Self-pay | Admitting: Family

## 2018-04-05 ENCOUNTER — Ambulatory Visit (INDEPENDENT_AMBULATORY_CARE_PROVIDER_SITE_OTHER): Payer: Medicare Other | Admitting: Family

## 2018-04-05 VITALS — BP 99/67 | HR 74 | Temp 98.5°F | Resp 16 | Ht 65.0 in | Wt 225.6 lb

## 2018-04-05 DIAGNOSIS — Z125 Encounter for screening for malignant neoplasm of prostate: Secondary | ICD-10-CM

## 2018-04-05 DIAGNOSIS — Z0001 Encounter for general adult medical examination with abnormal findings: Secondary | ICD-10-CM | POA: Diagnosis not present

## 2018-04-05 DIAGNOSIS — Z0000 Encounter for general adult medical examination without abnormal findings: Secondary | ICD-10-CM

## 2018-04-05 DIAGNOSIS — R350 Frequency of micturition: Secondary | ICD-10-CM | POA: Diagnosis not present

## 2018-04-05 LAB — PSA, MEDICARE: PSA: 0.33 ng/mL (ref 0.10–4.00)

## 2018-04-05 LAB — URINALYSIS, ROUTINE W REFLEX MICROSCOPIC
Bilirubin Urine: NEGATIVE
Hgb urine dipstick: NEGATIVE
Ketones, ur: NEGATIVE
Leukocytes, UA: NEGATIVE
Nitrite: NEGATIVE
PH: 6 (ref 5.0–8.0)
RBC / HPF: NONE SEEN (ref 0–?)
SPECIFIC GRAVITY, URINE: 1.015 (ref 1.000–1.030)
Urine Glucose: >=1000 — AB
Urobilinogen, UA: 1 (ref 0.0–1.0)
WBC, UA: NONE SEEN (ref 0–?)

## 2018-04-05 MED ORDER — TAMSULOSIN HCL 0.4 MG PO CAPS: 0.4000 mg | ORAL_CAPSULE | Freq: Every day | ORAL | 5 refills | Status: DC

## 2018-04-05 NOTE — Patient Instructions (Signed)
Please complete lab work prior to leaving. Continue to work on healthy diet, exercise, weight loss.  

## 2018-04-05 NOTE — Progress Notes (Signed)
Subjective:    Patient ID: [name], male    DOB: 06/02/1961, 57 y.o.   MRN: [id number]  HPI  Patient presents today for complete physical.  Immunizations: tetanus 2010 Diet:  Reports that his diet is improving Wt Readings from Last 3 Encounters:  04/05/18 225 lb 9.6 oz (102.3 kg)  03/08/18 228 lb (103.4 kg)  09/08/17 227 lb 3.2 oz (103.1 kg)  Exercise: walks, coaching softball Colonoscopy: 12/05/15 due 2021 Vision:  01/12/18 Dental: reports that this is up to date  Review of Systems  Constitutional: Negative for unexpected weight change.  HENT: Positive for rhinorrhea.   Eyes: Positive for itching.  Respiratory: Negative for cough.   Cardiovascular:       Some mild swelling L knee  Gastrointestinal: Negative for diarrhea, nausea and vomiting.  Genitourinary: Positive for frequency. Negative for dysuria.  Musculoskeletal:       Some left knee pain, right hand pain at site of previous fracture.   Skin: Negative for rash.  Neurological:       Reports that he has occasional migraines, uses imitrex (once every few weeks)  Hematological: Negative for adenopathy.  Psychiatric/Behavioral:       Denies depression/anxiety   Past Medical History:  Diagnosis Date  . Asthma   . Cancer of kidney ([place])    s/p L nephrectomy (renal cell carcinoma)  . Carpal tunnel syndrome   . Compression fracture of lumbar vertebra ([place])   . Deviated septum   . Diabetes ([place])   . Fatty liver 03/08/2017  . Hepatitis A 1980  . Hypercholesteremia   . Hypertension   . Kidney disease   . Migraine   . Morbid obesity ([place]) 01/06/2017  . Sinus congestion   . Sleep apnea      Social History   Socioeconomic History  . Marital status: Married    Spouse name: Not on file  . Number of children: Not on file  . Years of education: Not on file  . Highest education level: Not on file  Occupational History  . Not on file  Social Needs  . Financial resource strain: Not on file  . Food  insecurity:    Worry: Not on file    Inability: Not on file  . Transportation needs:    Medical: Not on file    Non-medical: Not on file  Tobacco Use  . Smoking status: Former Smoker    Packs/day: 1.00    Years: 10.00    Pack years: 10.00    Types: Cigarettes    Last attempt to quit: 12/28/1978    Years since quitting: 39.2  . Smokeless tobacco: Never Used  Substance and Sexual Activity  . Alcohol use: Not on file  . Drug use: Not on file  . Sexual activity: Not on file  Lifestyle  . Physical activity:    Days per week: Not on file    Minutes per session: Not on file  . Stress: Not on file  Relationships  . Social connections:    Talks on phone: Not on file    Gets together: Not on file    Attends religious service: Not on file    Active member of club or organization: Not on file    Attends meetings of clubs or organizations: Not on file    Relationship status: Not on file  . Intimate partner violence:    Fear of current or ex partner: Not on file    Emotionally abused:  Not on file    Physically abused: Not on file    Forced sexual activity: Not on file  Other Topics Concern  . 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 ANKLE SURGERY    . BACK SURGERY  2007  . HAND SURGERY Right   . KNEE SURGERY Left    X2  . NEPHRECTOMY Left 2003    Family History  Problem Relation Age of Onset  . Glaucoma Maternal Grandmother   . Leukemia Maternal Grandfather   . Heart attack Brother   . Allergies Brother   . Allergies Sister   . Asthma Sister   . Heart disease Mother   . Rheumatologic disease Mother   . Heart disease Father   . Rheumatologic disease Sister   .  Cancer Paternal Grandfather        leukemia    Allergies  Allergen Reactions  . Hydrocodone Itching  . Niacin And Related Hives    Current Outpatient Medications on File Prior to Visit  Medication Sig Dispense Refill  . Acetaminophen-Codeine (TYLENOL WITH CODEINE #3 PO) Take 1 tablet by mouth 3 (three) times daily as needed.    . ALBUTEROL IN Inhale 2 puffs into the lungs as needed.    Marland Kitchen atorvastatin (LIPITOR) 80 MG tablet Take 80 mg by mouth daily.    . cetirizine (ZYRTEC) 10 MG tablet Take 10 mg by mouth daily.    . Cholecalciferol (VITAMIN D-3 PO) Take 2,000 Int'l Units by mouth daily.    . citalopram (CELEXA) 40 MG tablet Take 40 mg by mouth daily.    . fluticasone (FLONASE) 50 MCG/ACT nasal spray Place 2 sprays into both nostrils daily.    Marland Kitchen gabapentin (NEURONTIN) 600 MG tablet Take 600 mg by mouth 4 (four) times daily.     . hydrochlorothiazide (HYDRODIURIL) 25 MG tablet Take 25 mg by mouth daily.    . insulin aspart protamine- aspart (NOVOLOG MIX 70/30) (70-30) 100 UNIT/ML injection Inject 62 Units into the skin 2 (two) times daily with a meal.    . Insulin Syringe-Needle U-100 (INSULIN SYRINGE .3CC/31GX5/16") 31G X 5/16" 0.3 ML MISC by Does not apply route.    Marland Kitchen ketotifen (ZADITOR) 0.025 % ophthalmic solution Place 1 drop into both eyes 2 (two) times daily.     . Lancets (ONETOUCH ULTRASOFT) lancets Use as instructed 100 each 2  . levothyroxine (SYNTHROID, LEVOTHROID) 50 MCG tablet TAKE 1 TABLET BY MOUTH DAILY 30 tablet 5  . Lidocaine HCl (ASPERCREME LIDOCAINE) 4 % LIQD Apply 1 application topically daily as needed.    Marland Kitchen losartan (COZAAR) 25 MG tablet Take 25 mg by mouth daily.    . metFORMIN (GLUCOPHAGE-XR) 500 MG 24 hr tablet Take 1,000 mg by mouth 2 (two) times daily.     . methocarbamol (ROBAXIN) 750 MG tablet Take 750 mg by mouth 3 (three) times daily.    . mometasone (ASMANEX) 220 MCG/INH inhaler Inhale 2 puffs into the lungs at bedtime.    . mupirocin ointment  (BACTROBAN) 2 % Place 1 application into the nose 2 (two) times daily.    . ONE TOUCH ULTRA TEST  test strip USE AS INSTRUCTED TO CHECK BLOOD SUGAR THREE TIMES DAILY. 100 each 5  . pantoprazole (PROTONIX) 40 MG tablet Take 40 mg by mouth daily.    . promethazine (PHENERGAN) 25 MG tablet Take 25 mg by mouth every 6 (six) hours as needed for nausea or vomiting.    . ranitidine (ZANTAC) 300 MG capsule Take 300 mg by mouth every evening.    . SUMAtriptan (IMITREX) 100 MG tablet Take 100 mg by mouth every 2 (two) hours as needed for migraine. May repeat in 2 hours if headache persists or recurs.    . tamsulosin (FLOMAX) 0.4 MG CAPS capsule TAKE 1 CAPSULE (0.4 MG TOTAL) BY MOUTH DAILY. 30 capsule 5  . testosterone (ANDROGEL) 50 MG/5GM (1%) GEL Place 5 g onto the skin daily.     No current facility-administered medications on file prior to visit.     BP 99/67 (BP Location: Left Arm, Patient Position: Sitting, Cuff Size: Large)   Pulse 74   Temp 98.5 F (36.9 C) (Oral)   Resp 16   Ht 5\' 5"  (1.651 m)   Wt 225 lb 9.6 oz (102.3 kg)   SpO2 97%   BMI 37.54 kg/m        Objective:   Physical Exam  Physical Exam  Constitutional: He is oriented to person, place, and time. He appears well-developed and well-nourished. No distress.  Overweight appearing hispanic male HENT:  Head: Normocephalic and atraumatic.  Right Ear: Tympanic membrane and ear canal normal.  Left Ear: Tympanic membrane and ear canal normal.  Mouth/Throat: Oropharynx is clear and moist.  Eyes: Pupils are equal, round, and reactive to light. No scleral icterus.  Neck: Normal range of motion. No thyromegaly present.  Cardiovascular: Normal rate and regular rhythm.   No murmur heard. Pulmonary/Chest: Effort normal and breath sounds normal. No respiratory distress. He has no wheezes. He has no rales. He exhibits no tenderness.  Abdominal: Soft. Bowel sounds are normal. He exhibits no distension and no mass. There is no tenderness.  There is no rebound and no guarding.  Musculoskeletal: He exhibits no edema.  Lymphadenopathy:    He has no cervical adenopathy.  Neurological: He is alert and oriented to person, place, and time. He has normal patellar reflexes. He exhibits normal muscle tone. Coordination normal.  Skin: Skin is warm and dry.  Psychiatric: He has a normal mood and affect. His behavior is normal. Judgment and thought content normal.           Assessment & Plan:         Assessment & Plan:  Preventative care- discussed healthy diet, exercise, weight loss.  EKG tracing is personally reviewed.  EKG notes NSR.  No acute changes.    Urinary frequency- already on flomax. Could be related to hyperglycemia.  (DM is managed by endo). Or infection. Check UA and culture.

## 2018-04-06 LAB — URINE CULTURE
MICRO NUMBER: 90436040
RESULT: NO GROWTH
SPECIMEN QUALITY: ADEQUATE

## 2018-04-14 ENCOUNTER — Other Ambulatory Visit: Payer: Self-pay | Admitting: Family

## 2018-04-25 MED FILL — LEVOTHYROXINE 50 MCG TABLET: 50 | 30 days supply | Qty: 30 | Fill #0

## 2018-04-28 MED FILL — TAMSULOSIN HCL 0.4 MG CAP: 0.4 | 30 days supply | Qty: 30 | Fill #0

## 2018-05-18 LAB — HM DIABETES EYE EXAM

## 2018-06-10 ENCOUNTER — Encounter: Payer: Self-pay | Admitting: Family

## 2018-06-10 MED FILL — TAMSULOSIN HCL 0.4 MG CAP: 0.4 | 30 days supply | Qty: 30 | Fill #1

## 2018-06-10 MED FILL — LEVOTHYROXINE 50 MCG TABLET: 50 | 30 days supply | Qty: 30 | Fill #1

## 2018-07-18 MED FILL — LEVOTHYROXINE 50 MCG TABLET: 50 | 30 days supply | Qty: 30 | Fill #2

## 2018-07-18 MED FILL — TAMSULOSIN HCL 0.4 MG CAP: 0.4 | 30 days supply | Qty: 30 | Fill #2

## 2018-08-24 MED FILL — TAMSULOSIN HCL 0.4 MG CAP: 0.4 | 30 days supply | Qty: 30 | Fill #3

## 2018-10-05 ENCOUNTER — Ambulatory Visit: Payer: Medicare Other | Admitting: Family

## 2018-10-05 DIAGNOSIS — Z0289 Encounter for other administrative examinations: Secondary | ICD-10-CM

## 2018-10-11 ENCOUNTER — Encounter: Payer: Self-pay | Admitting: Family

## 2018-11-09 ENCOUNTER — Ambulatory Visit (INDEPENDENT_AMBULATORY_CARE_PROVIDER_SITE_OTHER): Payer: Medicare Other | Admitting: Family Medicine

## 2018-11-09 ENCOUNTER — Ambulatory Visit (HOSPITAL_BASED_OUTPATIENT_CLINIC_OR_DEPARTMENT_OTHER)
Admission: RE | Admit: 2018-11-09 | Discharge: 2018-11-09 | Disposition: A | Discharge status: Home / Self Care | Payer: Medicare Other | Source: Ambulatory Visit | Attending: Family Medicine | Admitting: Family Medicine

## 2018-11-09 ENCOUNTER — Encounter: Payer: Self-pay | Admitting: Family Medicine

## 2018-11-09 VITALS — BP 124/82 | HR 107 | Temp 99.1°F | Resp 18 | Ht 65.0 in | Wt 222.2 lb

## 2018-11-09 DIAGNOSIS — R05 Cough: Secondary | ICD-10-CM | POA: Insufficient documentation

## 2018-11-09 DIAGNOSIS — R059 Cough, unspecified: Secondary | ICD-10-CM

## 2018-11-09 DIAGNOSIS — R509 Fever, unspecified: Secondary | ICD-10-CM | POA: Diagnosis not present

## 2018-11-09 DIAGNOSIS — R6889 Other general symptoms and signs: Secondary | ICD-10-CM

## 2018-11-09 MED ORDER — OSELTAMIVIR PHOSPHATE 75 MG PO CAPS: 75.0000 mg | ORAL_CAPSULE | Freq: Two times a day (BID) | ORAL | 0 refills | Status: DC

## 2018-11-09 MED FILL — OSELTAMIVIR PHOSPHATE 75 MG: 75 | 5 days supply | Qty: 10 | Fill #0

## 2018-11-09 NOTE — Progress Notes (Signed)
[facility] [address], [place], [place] [address] [phone]  Date:  11/09/2018   Name:  [name]   DOB:  Oct 20, 1961   MRN:  [id number]  PCP:  [name], NP    Chief Complaint: URI (cough,sore throat, "raw feeling in chest" congestion, ear pain, fever, 3 days, chills, aches, recurrent sinus infection)   History of Present Illness:  [name] is a 57 y.o. very pleasant male patient who presents with the following:  Pt of [facility] here today for a sick visit History of obesity, OSA, renal cell carcinoma s/p nephrectomy, HTN, hyperlipidemia, DM He also gets care at the [place]  He first got sick on Saturday- today is Wednesday Started with runny nose, ears popping, sinus congestion Then he got chest congestion and cough  Cough may be productive His chest and throat fell raw Cough can be painful He did feel a subjective fever last night and was having chills  He is feeling a bit better today He has tried some OTC cold meds  [facility]- reports his a1c was 8.1%  Lab Results  Component Value Date   HGBA1C 8.2 (H) 02/05/2017      Patient Active Problem List   Diagnosis Date Noted  . Hypersomnia with sleep apnea 03/10/2017  . Fatty liver 03/08/2017  . Morbid obesity ([place]) 01/06/2017  . Dysphagia 08/28/2016  . Hypothyroidism 08/28/2016  . Solitary kidney, acquired 12/10/2015  . GERD (gastroesophageal reflux disease) 12/10/2015  . Erectile dysfunction 12/10/2015  . BPH (benign prostatic hyperplasia) 12/10/2015  . Hypogonadism in male 12/10/2015  . Depression 12/10/2015  . Renal cell carcinoma ([place]) 12/10/2015  . Asthma 11/08/2015  . OSA treated with BiPAP 11/08/2015  . Essential hypertension 11/08/2015  . Hypercholesterolemia 11/08/2015  . Type 2 diabetes mellitus with insulin therapy ([place]) 11/08/2015  . Chronic renal insufficiency  11/08/2015  . Osteoarthritis 11/08/2015    Past Medical History:  Diagnosis Date  . Asthma   . Cancer of kidney ([place])    s/p L nephrectomy (renal cell carcinoma)  . Carpal tunnel syndrome   . Compression fracture of lumbar vertebra ([place])   . Deviated septum   . Diabetes ([place])   . Fatty liver 03/08/2017  . Hepatitis A 1980  . Hypercholesteremia   . Hypertension   . Kidney disease   . Migraine   . Morbid obesity ([place]) 01/06/2017  . Sinus congestion   . Sleep apnea     Past Surgical History:  Procedure Laterality Date  . ANKLE SURGERY    . BACK SURGERY  2007  . HAND SURGERY Right   . KNEE SURGERY Left    X2  . NEPHRECTOMY Left 2003    Social History   Tobacco Use  . Smoking status: Former Smoker    Packs/day: 1.00    Years: 10.00    Pack years: 10.00    Types: Cigarettes    Last attempt to quit: 12/28/1978    Years since quitting: 39.8  . Smokeless tobacco: Never Used  Substance Use Topics  . Alcohol use: Not on file  . Drug use: Not on file    Family History  Problem Relation Age of Onset  . Glaucoma Maternal Grandmother   . Leukemia Maternal Grandfather   . Heart attack Brother   . Allergies Brother   . Allergies Sister   . Asthma  Sister   . Heart disease Mother   . Rheumatologic disease Mother   . Heart disease Father   . Rheumatologic disease Sister   . Cancer Paternal Grandfather        leukemia    Allergies  Allergen Reactions  . Hydrocodone Itching  . Niacin And Related Hives    Medication list has been reviewed and updated.  Current Outpatient Medications on File Prior to Visit  Medication Sig Dispense Refill  . ALBUTEROL IN Inhale 2 puffs into the lungs as needed.    Marland Kitchen atorvastatin (LIPITOR) 80 MG tablet Take 80 mg by mouth daily.    . cetirizine (ZYRTEC) 10 MG tablet Take 10 mg by mouth daily.    . Cholecalciferol (VITAMIN D-3 PO) Take 2,000 Int'l Units by mouth daily.    . citalopram (CELEXA) 40 MG tablet Take 40 mg by mouth daily.     . fluticasone (FLONASE) 50 MCG/ACT nasal spray Place 2 sprays into both nostrils daily.    Marland Kitchen gabapentin (NEURONTIN) 600 MG tablet Take 600 mg by mouth 4 (four) times daily.     . hydrochlorothiazide (HYDRODIURIL) 25 MG tablet Take 25 mg by mouth daily.    . insulin aspart protamine- aspart (NOVOLOG MIX 70/30) (70-30) 100 UNIT/ML injection Inject 62 Units into the skin 2 (two) times daily with a meal.    . Insulin Syringe-Needle U-100 (INSULIN SYRINGE .3CC/31GX5/16") 31G X 5/16" 0.3 ML MISC by Does not apply route.    Marland Kitchen ketotifen (ZADITOR) 0.025 % ophthalmic solution Place 1 drop into both eyes 2 (two) times daily.     . Lancets (ONETOUCH ULTRASOFT) lancets Use as instructed 100 each 2  . levothyroxine (SYNTHROID, LEVOTHROID) 50 MCG tablet TAKE 1 TABLET BY MOUTH DAILY 30 tablet 5  . Lidocaine HCl (ASPERCREME LIDOCAINE) 4 % LIQD Apply 1 application topically daily as needed.    Marland Kitchen losartan (COZAAR) 25 MG tablet Take 25 mg by mouth daily.    . metFORMIN (GLUCOPHAGE-XR) 500 MG 24 hr tablet Take 1,000 mg by mouth 2 (two) times daily.     . methocarbamol (ROBAXIN) 750 MG tablet Take 750 mg by mouth 3 (three) times daily.    . mupirocin ointment (BACTROBAN) 2 % Place 1 application into the nose 2 (two) times daily.    . ONE TOUCH ULTRA TEST test strip USE AS INSTRUCTED TO CHECK BLOOD SUGAR THREE TIMES DAILY. 100 each 5  . pantoprazole (PROTONIX) 40 MG tablet Take 40 mg by mouth daily.    . pioglitazone (ACTOS) 45 MG tablet Take 45 mg by mouth daily.    . pramipexole (MIRAPEX) 0.25 MG tablet Take 0.25 mg by mouth at bedtime.    . ranitidine (ZANTAC) 300 MG capsule Take 300 mg by mouth every evening.    . SUMAtriptan (IMITREX) 100 MG tablet Take 100 mg by mouth every 2 (two) hours as needed for migraine. May repeat in 2 hours if headache persists or recurs.    . tamsulosin (FLOMAX) 0.4 MG CAPS capsule Take 1 capsule (0.4 mg total) by mouth daily. 30 capsule 5  . testosterone (ANDROGEL) 50 MG/5GM (1%) GEL  Place 5 g onto the skin daily.    . urea (CARMOL) 10 % cream Apply topically 3 (three) times daily.     No current facility-administered medications on file prior to visit.     Review of Systems: As per HPI- otherwise negative.   Physical Examination: Vitals:   11/09/18 1145  BP: 124/82  Pulse: (!) 107  Resp: 18  Temp: 99.1 F (37.3 C)  SpO2: 97%   Vitals:   11/09/18 1145  Weight: 222 lb 3.2 oz (100.8 kg)  Height: 5\' 5"  (1.651 m)   Body mass index is 36.98 kg/m. Ideal Body Weight: Weight in (lb) to have BMI = 25: 149.9  GEN: WDWN, NAD, Non-toxic, A & O x 3,looks like he may have flu  HEENT: Atraumatic, Normocephalic. Neck supple. No masses, No LAD. Bilateral TM wnl, oropharynx normal.  PEERL,EOMI.   Ears and Nose: No external deformity. CV: RRR, No M/G/R. No JVD. No thrill. No extra heart sounds. PULM: CTA B, no wheezes, crackles, rhonchi. No retractions. No resp. distress. No accessory muscle use. ABD: S, NT, ND EXTR: No c/c/e NEURO Normal gait.  PSYCH: Normally interactive. Conversant. Not depressed or anxious appearing.  Calm demeanor.  Unable to do flu swab as we are out  Assessment and Plan: Flu-like symptoms - Plan: oseltamivir (TAMIFLU) 75 MG capsule  Low grade fever  Cough - Plan: DG Chest 2 View  Here today with possible flu Treat with tamiflu Do chest film as well to rule out pneumonia   Signed Lamar Blinks, MD  Received his chest film- called but no answer. Will send on mychart   Dg Chest 2 View  Result Date: 11/09/2018 CLINICAL DATA:  Cough, chest pain. EXAM: CHEST - 2 VIEW COMPARISON:  Radiographs of February 25, 2017. FINDINGS: The heart size and mediastinal contours are within normal limits. Both lungs are clear. No pneumothorax or pleural effusion is noted. The visualized skeletal structures are unremarkable. IMPRESSION: No active cardiopulmonary disease. Electronically Signed   By: Marijo Conception, M.D.   On: 11/09/2018 12:33

## 2018-11-09 NOTE — Patient Instructions (Signed)
I think that you may have the flu We are going to treat you with tamiflu for 5 days Also please get a chest x-ray on the ground floor I will call you with this result later today Rest, fluids Let me know if you are not improving in the next few days-Sooner if worse.

## 2018-11-29 ENCOUNTER — Encounter: Payer: Self-pay | Admitting: Pediatrics

## 2018-12-27 ENCOUNTER — Ambulatory Visit: Payer: Self-pay | Admitting: Pediatrics

## 2019-01-02 ENCOUNTER — Ambulatory Visit (INDEPENDENT_AMBULATORY_CARE_PROVIDER_SITE_OTHER): Payer: PRIVATE HEALTH INSURANCE | Admitting: Pediatrics

## 2019-01-02 ENCOUNTER — Encounter: Payer: Self-pay | Admitting: Pediatrics

## 2019-01-02 VITALS — BP 116/88 | HR 87 | Temp 98.1°F | Resp 20 | Ht 64.0 in | Wt 225.0 lb

## 2019-01-02 DIAGNOSIS — E119 Type 2 diabetes mellitus without complications: Secondary | ICD-10-CM

## 2019-01-02 DIAGNOSIS — Z905 Acquired absence of kidney: Secondary | ICD-10-CM

## 2019-01-02 DIAGNOSIS — I10 Essential (primary) hypertension: Secondary | ICD-10-CM

## 2019-01-02 DIAGNOSIS — J342 Deviated nasal septum: Secondary | ICD-10-CM

## 2019-01-02 DIAGNOSIS — H1010 Acute atopic conjunctivitis, unspecified eye: Secondary | ICD-10-CM | POA: Insufficient documentation

## 2019-01-02 DIAGNOSIS — Z794 Long term (current) use of insulin: Secondary | ICD-10-CM

## 2019-01-02 DIAGNOSIS — Z85528 Personal history of other malignant neoplasm of kidney: Secondary | ICD-10-CM

## 2019-01-02 DIAGNOSIS — L501 Idiopathic urticaria: Secondary | ICD-10-CM

## 2019-01-02 DIAGNOSIS — IMO0001 Reserved for inherently not codable concepts without codable children: Secondary | ICD-10-CM

## 2019-01-02 DIAGNOSIS — J301 Allergic rhinitis due to pollen: Secondary | ICD-10-CM | POA: Diagnosis not present

## 2019-01-02 DIAGNOSIS — G4733 Obstructive sleep apnea (adult) (pediatric): Secondary | ICD-10-CM

## 2019-01-02 DIAGNOSIS — J4540 Moderate persistent asthma, uncomplicated: Secondary | ICD-10-CM | POA: Diagnosis not present

## 2019-01-02 DIAGNOSIS — K219 Gastro-esophageal reflux disease without esophagitis: Secondary | ICD-10-CM

## 2019-01-02 NOTE — Progress Notes (Signed)
[address] [place] [address] Dept: [phone]  New Patient Note  Patient ID: [name], male    DOB: 1961/06/25  Age: 58 y.o. MRN: [id number] Date of Office Visit: 01/02/2019 Referring provider: [name], NP [address] [place], [place] [address]    Chief Complaint: Allergies (congestion- spring is worse) and Asthma (rattle)  HPI [name] presents for an allergy evaluation.  He is referred by the [facility] system.  He has had itching and at times hives for several decades.  He has had nasal congestion and sneezing for several years.  He has obstructive sleep apnea and uses BiPAP.  He has had itchy eyes.  Has a deviation of the nasal septum to the left side and is scheduled to have surgery in the end of February.  He has had asthma since 2003.  He has had pneumonia twice in the past.  He has aggravation of his symptoms on exposure to dust, cigarette smoke, perfumes and weather changes.  He has very itchy eyes in the springtime particularly when he mows the lawn.  He has gastroesophageal reflux.  He has insulin-dependent diabetes since 2007.  His diabetes is well controlled.  Review of Systems  Constitutional: Negative.   HENT:       Obstructive sleep apnea needing BiPAP.  Deviated nasal septum.  Allergic rhinitis for several decades  Eyes:       Itchy eyes at times  Respiratory:       Asthma since 2003 .  Pneumonia twice in the past  Cardiovascular:       Hypertension  Gastrointestinal: Positive for heartburn.  Genitourinary:       Left nephrectomy for cancer in 2003.  Urinary frequency  Musculoskeletal:       Left knee replacement.  Right wrist surgery right shoulder surgery   Skin:       Hives and itching for several decades  Neurological:       Fracture T12.  Lumbar fusion  Endo/Heme/Allergies:       Hypothyroidism.  Diabetes since 2007.  Dengue fever in the past.  History of hepatitis A  Psychiatric/Behavioral: Negative.      Outpatient Encounter Medications as of 01/02/2019  Medication Sig  . ALBUTEROL IN Inhale 2 puffs into the lungs as needed.  Marland Kitchen atorvastatin (LIPITOR) 80 MG tablet Take 80 mg by mouth daily.  . budesonide-formoterol (SYMBICORT) 160-4.5 MCG/ACT inhaler Inhale 2 puffs into the lungs 2 (two) times daily.  . cetirizine (ZYRTEC) 10 MG tablet Take 10 mg by mouth daily.  . citalopram (CELEXA) 40 MG tablet Take 40 mg by mouth daily.  . empagliflozin (JARDIANCE) 25 MG TABS tablet Take 25 mg by mouth daily.  . fluticasone (FLONASE) 50 MCG/ACT nasal spray Place 2 sprays into both nostrils daily.  Marland Kitchen gabapentin (NEURONTIN) 600 MG tablet Take 600 mg by mouth 4 (four) times daily.   . hydrochlorothiazide (HYDRODIURIL) 25 MG tablet Take 25 mg by mouth daily.  . insulin aspart protamine- aspart (NOVOLOG MIX 70/30) (70-30) 100 UNIT/ML injection Inject 62 Units into the skin 2 (two) times daily with a meal.  . Insulin Syringe-Needle U-100 (INSULIN SYRINGE .3CC/31GX5/16") 31G X 5/16" 0.3 ML MISC by Does not apply route.  Marland Kitchen ketotifen (ZADITOR) 0.025 % ophthalmic solution Place 1 drop into both eyes 2 (two) times daily.   . Lancets (ONETOUCH ULTRASOFT) lancets Use as instructed  . Lidocaine HCl (ASPERCREME LIDOCAINE) 4 % LIQD Apply 1 application  topically daily as needed.  Marland Kitchen losartan (COZAAR) 25 MG tablet Take 100 mg by mouth daily.   . metFORMIN (GLUCOPHAGE-XR) 500 MG 24 hr tablet Take 1,000 mg by mouth 2 (two) times daily.   . montelukast (SINGULAIR) 10 MG tablet Take 10 mg by mouth at bedtime.  . ONE TOUCH ULTRA TEST test strip USE AS INSTRUCTED TO CHECK BLOOD SUGAR THREE TIMES DAILY.  . pantoprazole (PROTONIX) 40 MG tablet Take 40 mg by mouth daily.  . pioglitazone (ACTOS) 45 MG tablet Take 45 mg by mouth daily.  . ranitidine (ZANTAC) 300 MG capsule Take 300 mg by mouth every evening.  . testosterone (ANDROGEL) 50 MG/5GM (1%) GEL Place 5 g onto the skin daily.  . urea (CARMOL) 10 % cream Apply topically 3  (three) times daily.  . tamsulosin (FLOMAX) 0.4 MG CAPS capsule Take 1 capsule (0.4 mg total) by mouth daily. (Patient not taking: Reported on 01/02/2019)  . [DISCONTINUED] Cholecalciferol (VITAMIN D-3 PO) Take 2,000 Int'l Units by mouth daily.  . [DISCONTINUED] levothyroxine (SYNTHROID, LEVOTHROID) 50 MCG tablet TAKE 1 TABLET BY MOUTH DAILY  . [DISCONTINUED] methocarbamol (ROBAXIN) 750 MG tablet Take 750 mg by mouth 3 (three) times daily.  . [DISCONTINUED] mupirocin ointment (BACTROBAN) 2 % Place 1 application into the nose 2 (two) times daily.  . [DISCONTINUED] oseltamivir (TAMIFLU) 75 MG capsule Take 1 capsule (75 mg total) by mouth 2 (two) times daily.  . [DISCONTINUED] pramipexole (MIRAPEX) 0.25 MG tablet Take 0.25 mg by mouth at bedtime.  . [DISCONTINUED] SUMAtriptan (IMITREX) 100 MG tablet Take 100 mg by mouth every 2 (two) hours as needed for migraine. May repeat in 2 hours if headache persists or recurs.   No facility-administered encounter medications on file as of 01/02/2019.      Drug Allergies:  Allergies  Allergen Reactions  . Hydrocodone Itching  . Niacin And Related Hives    Family History: Shakeem's family history includes Allergic rhinitis in his child; Allergies in his brother and sister; Angioedema in his mother; Asthma in his child and sister; Cancer in his paternal grandfather; Glaucoma in his maternal grandmother; Heart attack in his brother; Heart disease in his father and mother; Leukemia in his maternal grandfather; Lupus in his mother; Rheumatologic disease in his mother and sister..  Family history is positive for eczema, food allergies.  Family history is negative for chronic urticaria  Social and environmental.  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  He used to be a Software engineer.  They have dogs in the home.  He is not exposed to cigarette smoking.  He smoked cigarettes for 6 years averaging 1-1/2 packs/day  Physical Exam: BP 116/88   Pulse 87   Temp 98.1 F (36.7  C) (Oral)   Resp 20   Ht 5\' 4"  (1.626 m)   Wt 225 lb (102.1 kg)   SpO2 95%   BMI 38.62 kg/m    Physical Exam Vitals signs reviewed.  Constitutional:      Appearance: Normal appearance. He is obese.  HENT:     Head:     Comments: Eyes normal.  Ears normal.  Nose mild swelling of the nasal turbinates with a deviation to the left side.  Pharynx normal. Neck:     Musculoskeletal: Neck supple.     Comments: No thyromegaly Cardiovascular:     Comments: S1-S2 normal no murmurs Pulmonary:     Comments: Clear to percussion and auscultation Abdominal:     Palpations: Abdomen is soft.  Tenderness: There is no abdominal tenderness.     Comments: No hepatosplenomegaly  Lymphadenopathy:     Cervical: No cervical adenopathy.  Skin:    Comments: Clear  Neurological:     General: No focal deficit present.     Mental Status: He is alert and oriented to person, place, and time.  Psychiatric:        Mood and Affect: Mood normal.        Thought Content: Thought content normal.        Judgment: Judgment normal.     Diagnostics: FVC 3.05 L FEV1 2.71 L.  Predicted FVC 3.54 L predicted FEV1 2.84 L.  After albuterol 2 puffs FVC 2.90 L FEV1 2.64 L the spirometry is in the normal range and there was no significant improvement after albuterol  Allergy skin test were positive to grass pollens, ragweed pollen, tree pollens and some molds.  Skin testing to foods was negative   Assessment  Assessment and Plan: 1. Moderate persistent asthma without complication   2. Seasonal allergic rhinitis due to pollen   3. Seasonal allergic conjunctivitis   4. Obstructive sleep apnea with use of bilevel positive airway pressure (BPAP)   5. Insulin dependent diabetes mellitus ([place])   6. Essential hypertension   7. Deviated nasal septum   8. History of kidney cancer   9. History of left nephrectomy   10. Gastroesophageal reflux disease without esophagitis   11. Idiopathic urticaria        Patient  Instructions  Environmental control of dust and mold Zyrtec 10 mg-take 1 tablet once a day for itching but you may take one tablet twice a day for itching or runny nose Fluticasone 2 sprays per nostril at night for stuffy nose Zaditor 0.025% -1 drop 3 times a day if needed for itchy eyes Montelukast 10 mg-take 1 tablet once a day to prevent coughing or wheezing Symbicort 160-2 puffs every 12 hours to prevent coughing or wheezing Pro-air 2 puffs every 4 hours if needed for wheezing or coughing spells.  You may use Pro-air 2 puffs 5 to 15 minutes before exercise Continue on your other medications Do foods with salicylates make you itch?    Return in about 4 weeks (around 01/30/2019).   Thank you for the opportunity to care for this patient.  Please do not hesitate to contact me with questions.  Penne Lash, M.D.  [facility] [address] [place], [place] [address] [phone]

## 2019-01-02 NOTE — Patient Instructions (Addendum)
Environmental control of dust and mold Zyrtec 10 mg-take 1 tablet once a day for itching but you may take one tablet twice a day for itching or runny nose Fluticasone 2 sprays per nostril at night for stuffy nose Zaditor 0.025% -1 drop 3 times a day if needed for itchy eyes Montelukast 10 mg-take 1 tablet once a day to prevent coughing or wheezing Symbicort 160-2 puffs every 12 hours to prevent coughing or wheezing Pro-air 2 puffs every 4 hours if needed for wheezing or coughing spells.  You may use Pro-air 2 puffs 5 to 15 minutes before exercise Continue on your other medications Do foods with salicylates make you itch?

## 2019-01-31 ENCOUNTER — Encounter: Payer: Self-pay | Admitting: Pediatrics

## 2019-01-31 ENCOUNTER — Ambulatory Visit (INDEPENDENT_AMBULATORY_CARE_PROVIDER_SITE_OTHER): Payer: PRIVATE HEALTH INSURANCE | Admitting: Pediatrics

## 2019-01-31 VITALS — BP 100/70 | HR 85 | Temp 97.9°F | Resp 20 | Ht 65.5 in | Wt 225.8 lb

## 2019-01-31 DIAGNOSIS — J342 Deviated nasal septum: Secondary | ICD-10-CM

## 2019-01-31 DIAGNOSIS — E119 Type 2 diabetes mellitus without complications: Secondary | ICD-10-CM | POA: Diagnosis not present

## 2019-01-31 DIAGNOSIS — G4733 Obstructive sleep apnea (adult) (pediatric): Secondary | ICD-10-CM | POA: Diagnosis not present

## 2019-01-31 DIAGNOSIS — J4540 Moderate persistent asthma, uncomplicated: Secondary | ICD-10-CM

## 2019-01-31 DIAGNOSIS — J301 Allergic rhinitis due to pollen: Secondary | ICD-10-CM

## 2019-01-31 DIAGNOSIS — C642 Malignant neoplasm of left kidney, except renal pelvis: Secondary | ICD-10-CM

## 2019-01-31 DIAGNOSIS — K219 Gastro-esophageal reflux disease without esophagitis: Secondary | ICD-10-CM

## 2019-01-31 DIAGNOSIS — Z905 Acquired absence of kidney: Secondary | ICD-10-CM

## 2019-01-31 DIAGNOSIS — I10 Essential (primary) hypertension: Secondary | ICD-10-CM

## 2019-01-31 NOTE — Progress Notes (Signed)
[address] [place] [address] Dept: [phone]  FOLLOW UP NOTE  Patient ID: [name], male    DOB: Sep 06, 1961  Age: 58 y.o. MRN: [id number] Date of Office Visit: 01/31/2019  Assessment  Chief Complaint: Follow-up  HPI REED DADY presents for follow-up of asthma and allergic rhinitis.  He had a cold about a week ago and has been having some coughing  His diabetes is well controlled.  He has had deviation of the nasal septum to the left side and is scheduled to have surgery in the end of February.  He has obstructive sleep apnea and uses BiPAP.  For several months he has been using mupirocin ointment in both nostrils.  Marland Kitchen  He is on montelukast 10 mg once a day and Symbicort 160-2 puffs every 12 hours.  Foods with salicylates did not make him itch.   Drug Allergies:  Allergies  Allergen Reactions  . Hydrocodone Itching  . Niacin And Related Hives    Physical Exam: BP 100/70 (BP Location: Right Arm, Patient Position: Sitting, Cuff Size: Large)   Pulse 85   Temp 97.9 F (36.6 C) (Oral)   Resp 20   Ht 5' 5.5" (1.664 m)   Wt 225 lb 12.8 oz (102.4 kg)   SpO2 97%   BMI 37.00 kg/m    Physical Exam Vitals signs reviewed.  Constitutional:      Appearance: Normal appearance. He is obese.  HENT:     Head:     Comments: Eyes normal.  Ears normal.  Nose shows some excoriations in the anterior nasal septum of both nostrils with a septal deviation to the left side Pharynx normal Neck:     Musculoskeletal: Neck supple.  Cardiovascular:     Comments: S1-S2 normal no murmur Pulmonary:     Comments: Clear to percussion and auscultation Lymphadenopathy:     Cervical: No cervical adenopathy.  Skin:    Comments: Clear  Neurological:     General: No focal deficit present.     Mental Status: He is alert and oriented to person, place, and time.  Psychiatric:        Mood and Affect: Mood normal.        Behavior: Behavior normal.        Thought Content:  Thought content normal.        Judgment: Judgment normal.     Diagnostics: FVC 3.08 L FEV1 2.80 L.  Predicted FVC 4.20 L predicted FEV1 3.25 L the spirometry shows a minimal reduction in the forced vital capacity  Assessment and Plan: 1. Obstructive sleep apnea with use of bilevel positive airway pressure (BPAP)   2. Moderate persistent asthma without complication   3. Deviated nasal septum   4. Diabetes mellitus without complication ([place])   5. Renal cell carcinoma of left kidney (H[facility])   6. H/O left radical nephrectomy   7. Essential hypertension   8. Gastroesophageal reflux disease without esophagitis   9. Seasonal allergic rhinitis due to pollen     No orders of the defined types were placed in this encounter.   Patient Instructions  Zyrtec 10 mg-take 1 tablet once or twice a day if needed for itching or runny nose Fluticasone 2 sprays per nostril at night for stuffy nose Zaditor 0.025% 1 drop 3 times a day if needed for itchy eyes Montelukast 10 mg-take 1 tablet once a day to prevent coughing or wheezing Symbicort 160-2 puffs every 12 hours to prevent coughing or wheezing  Pro-air 2 puffs every 4 hours if needed for wheezing or coughing spells.  You may use Pro-air 2 puffs 5 to 15 minutes before exercise See  your ENT specialist regarding the excoriations in your nostrils which have not improved with the use of mupirocin for several months Continue on your other medications Add prednisone 10 mg twice a day for 4 days, 10 mg on the fifth day to bring your asthma under control   Return in about 2 months (around 04/01/2019).    Thank you for the opportunity to care for this patient.  Please do not hesitate to contact me with questions.  Penne Lash, M.D.  [facility] [address] [place], [place] [address] [phone]

## 2019-01-31 NOTE — Patient Instructions (Addendum)
Zyrtec 10 mg-take 1 tablet once or twice a day if needed for itching or runny nose Fluticasone 2 sprays per nostril at night for stuffy nose Zaditor 0.025% 1 drop 3 times a day if needed for itchy eyes Montelukast 10 mg-take 1 tablet once a day to prevent coughing or wheezing Symbicort 160-2 puffs every 12 hours to prevent coughing or wheezing Pro-air 2 puffs every 4 hours if needed for wheezing or coughing spells.  You may use Pro-air 2 puffs 5 to 15 minutes before exercise See  your ENT specialist regarding the excoriations in your nostrils which have not improved with the use of mupirocin for several months Continue on your other medications Add prednisone 10 mg twice a day for 4 days, 10 mg on the fifth day to bring your asthma under control

## 2019-02-02 NOTE — Addendum Note (Signed)
Addended by: Orpah Greek D on: 02/02/2019 12:14 PM   Modules accepted: Orders

## 2019-02-14 ENCOUNTER — Ambulatory Visit (INDEPENDENT_AMBULATORY_CARE_PROVIDER_SITE_OTHER): Payer: Medicare Other | Admitting: Family

## 2019-02-14 ENCOUNTER — Encounter: Payer: Self-pay | Admitting: Family

## 2019-02-14 VITALS — BP 112/72 | HR 87 | Temp 98.2°F | Resp 16 | Ht 65.5 in | Wt 225.0 lb

## 2019-02-14 DIAGNOSIS — IMO0002 Reserved for concepts with insufficient information to code with codable children: Secondary | ICD-10-CM

## 2019-02-14 DIAGNOSIS — E1129 Type 2 diabetes mellitus with other diabetic kidney complication: Secondary | ICD-10-CM | POA: Diagnosis not present

## 2019-02-14 DIAGNOSIS — R1011 Right upper quadrant pain: Secondary | ICD-10-CM

## 2019-02-14 DIAGNOSIS — E039 Hypothyroidism, unspecified: Secondary | ICD-10-CM | POA: Diagnosis not present

## 2019-02-14 DIAGNOSIS — E785 Hyperlipidemia, unspecified: Secondary | ICD-10-CM | POA: Diagnosis not present

## 2019-02-14 DIAGNOSIS — E1165 Type 2 diabetes mellitus with hyperglycemia: Secondary | ICD-10-CM

## 2019-02-14 LAB — COMPREHENSIVE METABOLIC PANEL
ALT: 38 U/L (ref 0–53)
AST: 26 U/L (ref 0–37)
Albumin: 4.3 g/dL (ref 3.5–5.2)
Alkaline Phosphatase: 103 U/L (ref 39–117)
BILIRUBIN TOTAL: 0.5 mg/dL (ref 0.2–1.2)
BUN: 17 mg/dL (ref 6–23)
CO2: 30 mEq/L (ref 19–32)
Calcium: 9.7 mg/dL (ref 8.4–10.5)
Chloride: 99 mEq/L (ref 96–112)
Creatinine, Ser: 1.38 mg/dL (ref 0.40–1.50)
GFR: 53.23 mL/min — ABNORMAL LOW (ref >60.00)
GLUCOSE: 72 mg/dL (ref 70–99)
Potassium: 4.1 mEq/L (ref 3.5–5.1)
Sodium: 138 mEq/L (ref 135–145)
Total Protein: 7.2 g/dL (ref 6.0–8.3)

## 2019-02-14 LAB — LIPID PANEL
Cholesterol: 160 mg/dL (ref 0–200)
HDL: 36 mg/dL — AB (ref >39.00)
LDL Cholesterol: 88 mg/dL (ref 0–99)
NonHDL: 124.41
Total CHOL/HDL Ratio: 4
Triglycerides: 180 mg/dL — ABNORMAL HIGH (ref 0.0–149.0)
VLDL: 36 mg/dL (ref 0.0–40.0)

## 2019-02-14 LAB — HEMOGLOBIN A1C: Hgb A1c MFr Bld: 8.1 % — ABNORMAL HIGH (ref 4.6–6.5)

## 2019-02-14 LAB — TSH: TSH: 5.2 u[IU]/mL — ABNORMAL HIGH (ref 0.35–4.50)

## 2019-02-14 NOTE — Patient Instructions (Addendum)
Please complete lab work prior to leaving.  You should be contacted about scheduling your ultrasound.

## 2019-02-14 NOTE — Progress Notes (Signed)
Subjective:    Patient ID: [name], male    DOB: 1961-10-18, 58 y.o.   MRN: [id number]  HPI   Patient is a 58 yr old male who presents today for follow up. It is too soon to repeat his CPX.   HTN- he is now on losartan 100mg  and reports that his bp has been improved.  BP Readings from Last 3 Encounters:  02/14/19 112/72  01/31/19 100/70  01/02/19 116/88   Reports that he has nausea/bloating upper abdominal pain after fatty meals.  DM2- reports that his last A1C was 8.3. He was recently started on ozempic. He takes novolo 70/30 bid, oral metformin, actos and jardiance.   Lab Results  Component Value Date   MICROALBUR 22.5 (H) 12/10/2015   [place] 95 03/08/2018   CREATININE 1.34 03/08/2018   Hyperlipidemia- maintained on lipitor 80mg .  Lab Results  Component Value Date   CHOL 160 03/08/2018   HDL 37.40 (L) 03/08/2018   LDLCALC 95 03/08/2018   TRIG 139.0 03/08/2018   CHOLHDL 4 03/08/2018   Hypothyroid-  Lab Results  Component Value Date   TSH 2.93 03/08/2018   Review of Systems See HPI  Past Medical History:  Diagnosis Date  . Asthma   . Cancer of kidney ([place])    s/p L nephrectomy (renal cell carcinoma)  . Carpal tunnel syndrome   . Compression fracture of lumbar vertebra ([place])   . Deviated septum   . Diabetes ([place])   . Fatty liver 03/08/2017  . Fibromyalgia   . Hepatitis A 1980  . Hypercholesteremia   . Hypertension   . Kidney disease   . Migraine   . Morbid obesity ([place]) 01/06/2017  . Sinus congestion   . Sleep apnea      Social History   Socioeconomic History  . Marital status: Married    Spouse name: Not on file  . Number of children: Not on file  . Years of education: Not on file  . Highest education level: Not on file  Occupational History  . Not on file  Social Needs  . Financial resource strain: Not on file  . Food insecurity:    Worry: Not on file    Inability: Not on file  . Transportation needs:    Medical: Not on file      Non-medical: Not on file  Tobacco Use  . Smoking status: Former Smoker    Packs/day: 1.00    Years: 10.00    Pack years: 10.00    Types: Cigarettes    Last attempt to quit: 1986    Years since quitting: 34.1  . Smokeless tobacco: Never Used  Substance and Sexual Activity  . Alcohol use: Yes    Alcohol/week: 0.0 standard drinks    Comment: occasionally  . Drug use: Never  . Sexual activity: Not on file  Lifestyle  . Physical activity:    Days per week: Not on file    Minutes per session: Not on file  . Stress: Not on file  Relationships  . Social connections:    Talks on phone: Not on file    Gets together: Not on file    Attends religious service: Not on file    Active member of club or organization: Not on file    Attends meetings of clubs or organizations: Not on file    Relationship status: Not on file  . Intimate partner violence:    Fear of current or ex partner: Not  on file    Emotionally abused: Not on file    Physically abused: Not on file    Forced sexual activity: Not on file  Other Topics Concern  . 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 ANKLE SURGERY    . BACK SURGERY  2007  . HAND SURGERY Right   . KNEE SURGERY Left    X2  . NEPHRECTOMY Left 2003    Family History  Problem Relation Age of Onset  . Glaucoma Maternal Grandmother   . Leukemia Maternal Grandfather   . Heart attack Brother   . Allergies Brother   . Allergies Sister   . Asthma Sister   . Heart disease Mother   . Rheumatologic disease Mother   . Lupus Mother   . Angioedema Mother   . Heart disease Father   . Rheumatologic disease Sister   . Cancer Paternal Grandfather         leukemia  . Asthma Child   . Allergic rhinitis Child   . Eczema Neg Hx   . Urticaria Neg Hx     Allergies  Allergen Reactions  . Hydrocodone Itching  . Niacin And Related Hives    Current Outpatient Medications on File Prior to Visit  Medication Sig Dispense Refill  . ALBUTEROL IN Inhale 2 puffs into the lungs as needed.    Marland Kitchen atorvastatin (LIPITOR) 80 MG tablet Take 80 mg by mouth daily.    . budesonide-formoterol (SYMBICORT) 160-4.5 MCG/ACT inhaler Inhale 2 puffs into the lungs 2 (two) times daily.    . cetirizine (ZYRTEC) 10 MG tablet Take 10 mg by mouth daily.    . citalopram (CELEXA) 40 MG tablet Take 40 mg by mouth daily.    . empagliflozin (JARDIANCE) 25 MG TABS tablet Take 25 mg by mouth daily.    . fluticasone (FLONASE) 50 MCG/ACT nasal spray Place 2 sprays into both nostrils daily.    Marland Kitchen gabapentin (NEURONTIN) 600 MG tablet Take 600 mg by mouth 4 (four) times daily.     Marland Kitchen glucagon (GLUCAGEN) 1 MG SOLR injection Inject 1 mg into the vein once as needed for low blood sugar.    . hydrochlorothiazide (HYDRODIURIL) 25 MG tablet Take 25 mg by mouth daily.    . insulin aspart protamine- aspart (NOVOLOG MIX 70/30) (70-30) 100 UNIT/ML injection Inject 62 Units into the skin 2 (two) times daily with a meal.    . Insulin Syringe-Needle U-100 (INSULIN SYRINGE .3CC/31GX5/16") 31G X 5/16" 0.3 ML MISC by Does not apply route.    Marland Kitchen ketotifen (ZADITOR) 0.025 % ophthalmic solution Place 1 drop into both eyes 2 (two) times daily.     . Lancets (ONETOUCH ULTRASOFT) lancets Use as instructed 100 each 2  . Lidocaine HCl (ASPERCREME LIDOCAINE) 4 % LIQD Apply 1 application topically daily as needed.    Marland Kitchen losartan (COZAAR) 25 MG tablet Take 100 mg by mouth daily.     . metFORMIN (GLUCOPHAGE-XR) 500 MG 24 hr tablet Take 1,000 mg by mouth 2 (two) times daily.     . montelukast (SINGULAIR) 10 MG tablet Take 10 mg by mouth at bedtime.    Marland Kitchen  ONE TOUCH ULTRA TEST test strip USE AS INSTRUCTED TO  CHECK BLOOD SUGAR THREE TIMES DAILY. 100 each 5  . pantoprazole (PROTONIX) 40 MG tablet Take 40 mg by mouth daily.    . pioglitazone (ACTOS) 45 MG tablet Take 45 mg by mouth daily.    . ranitidine (ZANTAC) 300 MG capsule Take 300 mg by mouth every evening.    . Semaglutide,0.25 or 0.5MG /DOS, (OZEMPIC, 0.25 OR 0.5 MG/DOSE,) 2 MG/1.5ML SOPN Inject into the skin.    . tamsulosin (FLOMAX) 0.4 MG CAPS capsule Take 1 capsule (0.4 mg total) by mouth daily. 30 capsule 5  . testosterone cypionate (DEPOTESTOSTERONE CYPIONATE) 200 MG/ML injection Inject 200 mg into the muscle.    . urea (CARMOL) 10 % cream Apply topically 3 (three) times daily.     No current facility-administered medications on file prior to visit.     BP 112/72 (BP Location: Right Arm, Patient Position: Sitting, Cuff Size: Large)   Pulse 87   Temp 98.2 F (36.8 C) (Oral)   Resp 16   Ht 5' 5.5" (1.664 m)   Wt 225 lb (102.1 kg)   SpO2 97%   BMI 36.87 kg/m       Objective:   Physical Exam Constitutional:      General: He is not in acute distress.    Appearance: He is well-developed.  HENT:     Head: Normocephalic and atraumatic.  Cardiovascular:     Rate and Rhythm: Normal rate and regular rhythm.     Heart sounds: No murmur.  Pulmonary:     Effort: Pulmonary effort is normal. No respiratory distress.     Breath sounds: Normal breath sounds. No wheezing or rales.  Abdominal:     General: Bowel sounds are normal.     Palpations: Abdomen is soft.     Tenderness: There is abdominal tenderness in the right upper quadrant. Positive signs include Murphy's sign.  Skin:    General: Skin is warm and dry.  Neurological:     Mental Status: He is alert and oriented to person, place, and time.  Psychiatric:        Behavior: Behavior normal.        Thought Content: Thought content normal.           Assessment & Plan:  Right upper quadrant pain-suspect gallbladder.  Will obtain LFTs and refer for abdominal ultrasound.   Plan surgical referral if abnormal.   Diabetes type 2-this is uncontrolled but is better than it has been.  This is being managed by his endocrinologist at the [place].  Will obtain A1c for her records.  Hyperlipidemia-obtain follow-up lipid panel.  Continue statin.  Hypothyroid-clinically stable on Synthroid.  He reports that he has been out of Synthroid for 1 month.  Will obtain follow-up TSH.  They are going to contact me with the dose that he was taking most recently.

## 2019-02-15 ENCOUNTER — Encounter: Payer: Self-pay | Admitting: Family

## 2019-02-15 DIAGNOSIS — E039 Hypothyroidism, unspecified: Secondary | ICD-10-CM

## 2019-02-16 MED ORDER — LEVOTHYROXINE SODIUM 50 MCG PO TABS: 50.0000 ug | ORAL_TABLET | Freq: Every day | ORAL | 3 refills | Status: DC

## 2019-02-16 MED FILL — LEVOTHYROXINE 50 MCG TABLET: 50 | 30 days supply | Qty: 30 | Fill #0

## 2019-02-16 NOTE — Telephone Encounter (Signed)
Could you please contact the patient and ask him to check what his most recent dose of synthroid is? He needs to restart and I will send refill. Also, he should repeat TSH in 6 weeks, dx hypothyroid.

## 2019-02-17 ENCOUNTER — Ambulatory Visit (HOSPITAL_BASED_OUTPATIENT_CLINIC_OR_DEPARTMENT_OTHER)
Admission: RE | Admit: 2019-02-17 | Discharge: 2019-02-17 | Disposition: A | Discharge status: Home / Self Care | Payer: Medicare Other | Source: Ambulatory Visit | Attending: Family | Admitting: Family

## 2019-02-17 DIAGNOSIS — R1011 Right upper quadrant pain: Secondary | ICD-10-CM | POA: Insufficient documentation

## 2019-02-17 MED FILL — TAMSULOSIN HCL 0.4 MG CAP: 0.4 | 30 days supply | Qty: 30 | Fill #4

## 2019-02-19 ENCOUNTER — Telehealth: Payer: Self-pay | Admitting: Family

## 2019-02-19 DIAGNOSIS — R1011 Right upper quadrant pain: Secondary | ICD-10-CM

## 2019-02-19 NOTE — Telephone Encounter (Signed)
See mychart.  

## 2019-02-20 ENCOUNTER — Encounter: Payer: Self-pay | Admitting: Gastroenterology

## 2019-02-22 HISTORY — PX: NASAL SEPTUM SURGERY: SHX37

## 2019-03-09 ENCOUNTER — Ambulatory Visit (HOSPITAL_BASED_OUTPATIENT_CLINIC_OR_DEPARTMENT_OTHER)
Admission: RE | Admit: 2019-03-09 | Discharge: 2019-03-09 | Disposition: A | Discharge status: Home / Self Care | Payer: Medicare Other | Source: Ambulatory Visit | Attending: Medical | Admitting: Medical

## 2019-03-09 ENCOUNTER — Ambulatory Visit (INDEPENDENT_AMBULATORY_CARE_PROVIDER_SITE_OTHER): Payer: Medicare Other | Admitting: Medical

## 2019-03-09 ENCOUNTER — Encounter: Payer: Self-pay | Admitting: Medical

## 2019-03-09 ENCOUNTER — Telehealth: Payer: Self-pay | Admitting: Medical

## 2019-03-09 ENCOUNTER — Other Ambulatory Visit: Payer: Self-pay

## 2019-03-09 VITALS — BP 139/87 | HR 104 | Temp 98.6°F | Resp 16 | Ht 65.5 in | Wt 223.0 lb

## 2019-03-09 DIAGNOSIS — R062 Wheezing: Secondary | ICD-10-CM

## 2019-03-09 DIAGNOSIS — R0981 Nasal congestion: Secondary | ICD-10-CM

## 2019-03-09 DIAGNOSIS — J40 Bronchitis, not specified as acute or chronic: Secondary | ICD-10-CM | POA: Diagnosis not present

## 2019-03-09 DIAGNOSIS — J3489 Other specified disorders of nose and nasal sinuses: Secondary | ICD-10-CM

## 2019-03-09 DIAGNOSIS — R059 Cough, unspecified: Secondary | ICD-10-CM

## 2019-03-09 DIAGNOSIS — R05 Cough: Secondary | ICD-10-CM | POA: Diagnosis present

## 2019-03-09 LAB — CBC WITH DIFFERENTIAL/PLATELET
Basophils Absolute: 0.1 10*3/uL (ref 0.0–0.1)
Basophils Relative: 1 % (ref 0.0–3.0)
EOS PCT: 1.6 % (ref 0.0–5.0)
Eosinophils Absolute: 0.1 10*3/uL (ref 0.0–0.7)
HCT: 49.5 % (ref 39.0–52.0)
Hemoglobin: 16.8 g/dL (ref 13.0–17.0)
Lymphocytes Relative: 21.8 % (ref 12.0–46.0)
Lymphs Abs: 2 10*3/uL (ref 0.7–4.0)
MCHC: 33.8 g/dL (ref 30.0–36.0)
MCV: 86.3 fl (ref 78.0–100.0)
Monocytes Absolute: 0.8 10*3/uL (ref 0.1–1.0)
Monocytes Relative: 8.9 % (ref 3.0–12.0)
Neutro Abs: 6.1 10*3/uL (ref 1.4–7.7)
Neutrophils Relative %: 66.7 % (ref 43.0–77.0)
Platelets: 278 10*3/uL (ref 150.0–400.0)
RBC: 5.74 Mil/uL (ref 4.22–5.81)
RDW: 14.7 % (ref 11.5–15.5)
WBC: 9.1 10*3/uL (ref 4.0–10.5)

## 2019-03-09 MED ORDER — PREDNISONE 10 MG PO TABS: ORAL_TABLET | ORAL | 0 refills | Status: DC

## 2019-03-09 MED ORDER — DOXYCYCLINE HYCLATE 100 MG PO TABS: 100.0000 mg | ORAL_TABLET | Freq: Two times a day (BID) | ORAL | 0 refills | Status: DC

## 2019-03-09 MED ORDER — CEFTRIAXONE SODIUM 1 G IJ SOLR
1.0000 g | Freq: Once | INTRAMUSCULAR | Status: AC
  Administered 2019-03-09: 1 g via INTRAMUSCULAR

## 2019-03-09 MED ORDER — BENZONATATE 100 MG PO CAPS: 100.0000 mg | ORAL_CAPSULE | Freq: Three times a day (TID) | ORAL | 0 refills | Status: DC | PRN

## 2019-03-09 MED FILL — DOXYCYCLINE HYCLATE 100 MG: 100 | 5 days supply | Qty: 10 | Fill #0

## 2019-03-09 MED FILL — BENZONATATE 100 MG CAP: 100 | 10 days supply | Qty: 30 | Fill #0

## 2019-03-09 NOTE — Patient Instructions (Signed)
You do have recent bronchitis type signs and symptoms after surgery and if exposure to your wife you had upper respiratory type illness.  Your symptoms are lingering despite being on Augmentin post surgery.  I do want to get chest x-ray to rule out any walking pneumonia and get blood work to evaluate infection fighting cells.  We gave you 1 g Rocephin IM today in the office.  Will wait and review lab and x-ray findings.  If x-ray and labs look good then would prescribe doxycycline.  If any pneumonia shows then would likely give levofloxacin.  Prescription of benzonatate given for cough.  You do have some mild maxillary sinus region pain presently.  Think this slight tenderness corresponds with normal postsurgical pain.  For recent mild intermittent wheezing and history of asthma, would recommend that you continue with Symbicort and albuterol.  If your wheezing gets worse as described then you could start tapered dose of prednisone.  This was given as a printed prescription to use only if needed.  We discussed benefit versus risk of prednisone today.  If you do have to use of prednisone then follow the sliding scale with your 70/30 insulin.  Also continue Ozempic.  Follow-up with your ENT as regular scheduled.  Follow-up here 7 to 10 days for any lingering or persisting symptoms.  If any severe worsening or changing signs symptoms and recommend ED evaluation.

## 2019-03-09 NOTE — Progress Notes (Signed)
Subjective:    Patient ID: [name], male    DOB: 08-03-1961, 58 y.o.   MRN: [id number]  HPI  Pt in some chest congestion after his wife got sick with uri type symptoms and after is surgery. Pt states recent nasal septoplasty about 17 days ago.  He felt wheeze and short of breath over past 2 weeks. Pt was given augmentin post surgery. He also has ceftin available given by ENT. But he has not started ceftin.  Wheezing little worse/more at [employer]. Not wheezing all day. Asthma at time flares during the spring.  Pt has followed up with ent and they said everything going ok.  He is coughing up some frothy mucus.  Pt had no recent travel 2 weeks prior to symptoms onset. No direct contact to person with confirmed covid 19.   Seasonal allergies around this time of year.   Pt has been wheezing some intermittent at night. Pt has albuterol and symbicort 2 puffs twice a day.   Pt is diabetic. He is now on ozempic. Recently sugars have in low 100's. Pt is on insulin 70/30 sliding scale twice daily.   Review of Systems  Constitutional: Negative for chills, diaphoresis, fatigue and fever.  HENT: Positive for congestion and sinus pressure. Negative for ear pain, facial swelling, mouth sores, postnasal drip, rhinorrhea, sneezing, sore throat, trouble swallowing and voice change.   Respiratory: Positive for cough and wheezing. Negative for chest tightness and shortness of breath.   Cardiovascular: Negative for chest pain and palpitations.  Gastrointestinal: Negative for abdominal pain.  Musculoskeletal: Negative for back pain.  Skin: Negative for rash.  Neurological: Negative for dizziness, light-headedness and headaches.  Hematological: Negative for adenopathy. Does not bruise/bleed easily.  Psychiatric/Behavioral: Negative for behavioral problems and confusion. The patient is not nervous/anxious.     Past Medical History:  Diagnosis Date  . Asthma   . Cancer of kidney ([place])    s/p L nephrectomy (renal cell carcinoma)  . Carpal tunnel syndrome   . Compression fracture of lumbar vertebra ([place])   . Deviated septum   . Diabetes ([place])   . Fatty liver 03/08/2017  . Fibromyalgia   . Hepatitis A 1980  . Hypercholesteremia   . Hypertension   . Kidney disease   . Migraine   . Morbid obesity ([place]) 01/06/2017  . Sinus congestion   . Sleep apnea      Social History   Socioeconomic History  . Marital status: Married    Spouse name: Not on file  . Number of children: Not on file  . Years of education: Not on file  . Highest education level: Not on file  Occupational History  . Not on file  Social Needs  . Financial resource strain: Not on file  . Food insecurity:    Worry: Not on file    Inability: Not on file  . Transportation needs:    Medical: Not on file    Non-medical: Not on file  Tobacco Use  . Smoking status: Former Smoker    Packs/day: 1.00    Years: 10.00    Pack years: 10.00    Types: Cigarettes    Last attempt to quit: 1986    Years since quitting: 34.2  . Smokeless tobacco: Never Used  Substance and Sexual Activity  . Alcohol use: Yes    Alcohol/week: 0.0 standard drinks    Comment: occasionally  . Drug use: Never  . Sexual activity: Not on file  Lifestyle  .  Physical activity:    Days per week: Not on file    Minutes per session: Not on file  . Stress: Not on file  Relationships  . Social connections:    Talks on phone: Not on file    Gets together: Not on file    Attends religious service: Not on file    Active member of club or organization: Not on file    Attends meetings of clubs or organizations: Not on file    Relationship status: Not on file  . Intimate partner violence:    Fear of current or ex partner: Not on file    Emotionally abused: Not on file    Physically abused: Not on file    Forced sexual activity: Not on file  Other Topics Concern  . 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 ANKLE SURGERY    . BACK SURGERY  2007  . HAND SURGERY Right   . KNEE SURGERY Left    X2  . NEPHRECTOMY Left 2003    Family History  Problem Relation Age of Onset  . Glaucoma Maternal Grandmother   . Leukemia Maternal Grandfather   . Heart attack Brother   . Allergies Brother   . Allergies Sister   . Asthma Sister   . Heart disease Mother   . Rheumatologic disease Mother   . Lupus Mother   . Angioedema Mother   . Heart disease Father   . Rheumatologic disease Sister   . Cancer Paternal Grandfather        leukemia  . Asthma Child   . Allergic rhinitis Child   . Eczema Neg Hx   . Urticaria Neg Hx     Allergies  Allergen Reactions  . Hydrocodone Itching  . Niacin And Related Hives    Current Outpatient Medications on File Prior to Visit  Medication Sig Dispense Refill  . ALBUTEROL IN Inhale 2 puffs into the lungs as needed.    Marland Kitchen atorvastatin (LIPITOR) 80 MG tablet Take 80 mg by mouth daily.    . budesonide-formoterol (SYMBICORT) 160-4.5 MCG/ACT inhaler Inhale 2 puffs into the lungs 2 (two) times daily.    . cetirizine (ZYRTEC) 10 MG tablet Take 10 mg by mouth daily.    . citalopram (CELEXA) 40 MG tablet Take 40 mg by mouth daily.    . empagliflozin (JARDIANCE) 25 MG TABS tablet Take 25 mg by mouth daily.    . fluticasone (FLONASE) 50 MCG/ACT nasal spray Place 2 sprays into both nostrils daily.    Marland Kitchen gabapentin (NEURONTIN) 600 MG tablet Take 600 mg by mouth 4 (four) times daily.     Marland Kitchen glucagon (GLUCAGEN) 1 MG SOLR injection Inject 1 mg into the vein once as needed for low blood sugar.    . hydrochlorothiazide (HYDRODIURIL) 25 MG tablet Take 25 mg by mouth daily.    . insulin aspart  protamine- aspart (NOVOLOG MIX 70/30) (70-30) 100 UNIT/ML injection Inject 62 Units into the skin 2 (two) times daily with a meal.    . Insulin Syringe-Needle U-100 (INSULIN SYRINGE .3CC/31GX5/16") 31G X 5/16" 0.3 ML MISC by Does not apply  route.    . ketotifen (ZADITOR) 0.025 % ophthalmic solution Place 1 drop into both eyes 2 (two) times daily.     . Lancets (ONETOUCH ULTRASOFT) lancets Use as instructed 100 each 2  . levothyroxine (SYNTHROID, LEVOTHROID) 50 MCG tablet Take 1 tablet (50 mcg total) by mouth daily. 30 tablet 3  . Lidocaine HCl (ASPERCREME LIDOCAINE) 4 % LIQD Apply 1 application topically daily as needed.    Marland Kitchen losartan (COZAAR) 25 MG tablet Take 100 mg by mouth daily.     . metFORMIN (GLUCOPHAGE-XR) 500 MG 24 hr tablet Take 1,000 mg by mouth 2 (two) times daily.     . montelukast (SINGULAIR) 10 MG tablet Take 10 mg by mouth at bedtime.    . ONE TOUCH ULTRA TEST test strip USE AS INSTRUCTED TO CHECK BLOOD SUGAR THREE TIMES DAILY. 100 each 5  . pantoprazole (PROTONIX) 40 MG tablet Take 40 mg by mouth daily.    . pioglitazone (ACTOS) 45 MG tablet Take 45 mg by mouth daily.    . ranitidine (ZANTAC) 300 MG capsule Take 300 mg by mouth every evening.    . Semaglutide,0.25 or 0.5MG /DOS, (OZEMPIC, 0.25 OR 0.5 MG/DOSE,) 2 MG/1.5ML SOPN Inject into the skin.    . tamsulosin (FLOMAX) 0.4 MG CAPS capsule Take 1 capsule (0.4 mg total) by mouth daily. 30 capsule 5  . testosterone cypionate (DEPOTESTOSTERONE CYPIONATE) 200 MG/ML injection Inject 200 mg into the muscle.    . urea (CARMOL) 10 % cream Apply topically 3 (three) times daily.     No current facility-administered medications on file prior to visit.     BP 139/87 (BP Location: Right Arm, Patient Position: Sitting, Cuff Size: Small)   Pulse (!) 104   Temp 98.6 F (37 C) (Oral)   Resp 16   Ht 5' 5.5" (1.664 m)   Wt 223 lb (101.2 kg)   SpO2 97%   BMI 36.54 kg/m       Objective:   Physical Exam  General  Mental Status -  Alert. General Appearance - Well groomed. Not in acute distress.  Skin Rashes- No Rashes.  HEENT Head- Normal. Ear Auditory Canal - Left- Normal. Right - Normal.Tympanic Membrane- Left- Normal. Right- Normal. Eye Sclera/Conjunctiva- Left- Normal. Right- Normal. Nose & Sinuses Nasal Mucosa- Left-  Boggy and Congested. Right-  Boggy and  Congested.Bilateral  maxillary and frontal sinus pressure. Mouth & Throat Lips: Upper Lip- Normal: no dryness, cracking, pallor, cyanosis, or vesicular eruption. Lower Lip-Normal: no dryness, cracking, pallor, cyanosis or vesicular eruption. Buccal Mucosa- Bilateral- No Aphthous ulcers. Oropharynx- No Discharge or Erythema. Tonsils: Characteristics- Bilateral- No Erythema or Congestion. Size/Enlargement- Bilateral- No enlargement. Discharge- bilateral-None.  Neck Neck- Supple. No Masses.   Chest and Lung Exam Auscultation: Breath Sounds:-Clear even and unlabored.  Cardiovascular Auscultation:Rythm- Regular, rate and rhythm. Murmurs & Other Heart Sounds:Ausculatation of the heart reveal- No Murmurs.  Lymphatic Head & Neck General Head & Neck Lymphatics: Bilateral: Description- No Localized lymphadenopathy.       Assessment & Plan:  You do have recent bronchitis type signs and symptoms after surgery and if exposure to your wife you had upper respiratory type illness.  Your symptoms are lingering despite being on Augmentin post surgery.  I do want to get chest x-ray to rule out any walking pneumonia and get blood work to evaluate infection fighting cells.  We gave you 1 g Rocephin IM today in the office.  Will wait and review lab and x-ray findings.  If x-ray and labs look good then would prescribe doxycycline.  If any pneumonia shows then would likely give levofloxacin.  Prescription of benzonatate given for cough.  You do have some mild maxillary sinus region pain presently.  Think this slight tenderness corresponds with normal  postsurgical pain.  For recent mild intermittent wheezing and history of asthma, would recommend that you continue with Symbicort and albuterol.  If your wheezing gets worse as described then you could start tapered dose of prednisone.  This was given as a printed prescription to use only if needed.  We discussed benefit versus risk of prednisone today.  If you do have to use of prednisone then follow the sliding scale with your 70/30 insulin.  Also continue Ozempic.   Follow-up with your ENT as regular scheduled.  Follow-up here 7 to 10 days for any lingering or persisting symptoms.  If any severe worsening or changing signs symptoms and recommend ED evaluation.   Mackie Pai, PA-C

## 2019-03-09 NOTE — Telephone Encounter (Signed)
Rx doxycycline sent to pt pharmacy. 

## 2019-03-13 ENCOUNTER — Ambulatory Visit: Payer: Medicare Other | Admitting: Gastroenterology

## 2019-03-13 ENCOUNTER — Encounter: Payer: Self-pay | Admitting: Gastroenterology

## 2019-03-13 ENCOUNTER — Other Ambulatory Visit: Payer: Self-pay

## 2019-03-13 VITALS — BP 132/86 | HR 94 | Temp 98.3°F | Ht 65.0 in | Wt 223.5 lb

## 2019-03-13 DIAGNOSIS — R1011 Right upper quadrant pain: Secondary | ICD-10-CM

## 2019-03-13 DIAGNOSIS — R197 Diarrhea, unspecified: Secondary | ICD-10-CM | POA: Diagnosis not present

## 2019-03-13 DIAGNOSIS — R110 Nausea: Secondary | ICD-10-CM

## 2019-03-13 DIAGNOSIS — R140 Abdominal distension (gaseous): Secondary | ICD-10-CM | POA: Diagnosis not present

## 2019-03-13 MED ORDER — ONDANSETRON HCL 4 MG PO TABS: 4.0000 mg | ORAL_TABLET | Freq: Four times a day (QID) | ORAL | 1 refills | Status: DC | PRN

## 2019-03-13 MED FILL — ONDANSETRON HCL 4 MG TABLET: 4 | 7 days supply | Qty: 30 | Fill #0

## 2019-03-13 NOTE — Patient Instructions (Signed)
If you are age 57 or older, your body mass index should be between 23-30. Your Body mass index is 37.19 kg/m. If this is out of the aforementioned range listed, please consider follow up with your Primary Care Provider.  If you are age 57 or younger, your body mass index should be between 19-25. Your Body mass index is 37.19 kg/m. If this is out of the aformentioned range listed, please consider follow up with your Primary Care Provider.   You have been scheduled for a HIDA scan at [facility]iology (1st floor) on 03/21/2019. Please arrive 30 minutes prior to your scheduled appointment at  7:09GG. Make certain not to have anything to eat or drink at least 6 hours prior to your test. Should this appointment date or time not work well for you, please call radiology scheduling at [phone].  _____________________________________________________________________ hepatobiliary (HIDA) scan is an imaging procedure used to diagnose problems in the liver, gallbladder and bile ducts. In the HIDA scan, a radioactive chemical or tracer is injected into a vein in your arm. The tracer is handled by the liver like bile. Bile is a fluid produced and excreted by your liver that helps your digestive system break down fats in the foods you eat. Bile is stored in your gallbladder and the gallbladder releases the bile when you eat a meal. A special nuclear medicine scanner (gamma camera) tracks the flow of the tracer from your liver into your gallbladder and small intestine.  During your HIDA scan  You'll be asked to change into a hospital gown before your HIDA scan begins. Your health care team will position you on a table, usually on your back. The radioactive tracer is then injected into a vein in your arm.The tracer travels through your bloodstream to your liver, where it's taken up by the bile-producing cells. The radioactive tracer travels with the bile from your liver into your gallbladder and through your bile  ducts to your small intestine.You may feel some pressure while the radioactive tracer is injected into your vein. As you lie on the table, a special gamma camera is positioned over your abdomen taking pictures of the tracer as it moves through your body. The gamma camera takes pictures continually for about an hour. You'll need to keep still during the HIDA scan. This can become uncomfortable, but you may find that you can lessen the discomfort by taking deep breaths and thinking about other things. Tell your health care team if you're uncomfortable. The radiologist will watch on a computer the progress of the radioactive tracer through your body. The HIDA scan may be stopped when the radioactive tracer is seen in the gallbladder and enters your small intestine. This typically takes about an hour. In some cases extra imaging will be performed if original images aren't satisfactory, if morphine is given to help visualize the gallbladder or if the medication CCK is given to look at the contraction of the gallbladder. This test typically takes 2 hours to complete. ________________________________________________________________________  Please start taking a daily fiber supplement such as citrucel, benefiber, metamucil, or fiber choice.   We have sent the following medications to your pharmacy for you to pick up at your convenience: Zofran 4mg  every 6 hours as needed.  Fiber Chart  You should be consuming 25-30g of fiber per day and drinking 8 glasses of water to help your bowels move regularly.  In the chart below you can look up how much fiber you are getting in an  average day.  If you are not getting enough fiber, you should add a fiber supplement to your diet.  Examples of this include Metamucil, FiberCon and Citrucel.  These can be purchased at your local grocery store or pharmacy.      [email or web address]  It was a pleasure to see you today!   Vito Cirigliano, D.O.

## 2019-03-13 NOTE — Progress Notes (Signed)
Chief Complaint: RUQ Pain, diarrhea, nausea, boating  Referring Provider:     [name] NP   HPI:    [name] is a 58 y.o. male referred [facility] for evaluation of multiple GI symptoms, to include RUQ pain, diarrhea, nausea, bloating. Sxs worse with fatty foods with gas, bloating, and visible abdominal distension. RUQ pain typically 30-45 mins post PO, lasting 3-4 hours. Bloating not improved with Tums, Alka seltzer, etc. No previous antispasmotic agents, probiotics, fiber, etc.  Nausea is the most bothersome symptom, which improves with Phenergan prn, but he limits this use due to ADR of drowsiness.     Reports long history of intermittent loose stools and non-bloody diarrhea with urgency. No change with food types.   Has had sxs for years, and previously evaluated approx 3 years ago at the [facility]. Thinks he had a prior HIDA n/f sludge per patient (none of these records available for review today). Was having n/v/d and abdominal pain at that time.   Separately, hx of reflux, controlled with pantoprazole and ranitidine.  Colonoscopy approx 2015 for routine CRC screening notable for a few polyps, and he thinks with recommendation to repeat in 10 years. EGD at that time and was told he had a hiatal hernia.    Evaluation to date includes: -Normal CBC, CMP.  Elevated TSH at 5.2, A1c 8.1. - RUQ ultrasound (01/2019): Increased echotexture, 5 mm GB polyp and otherwise normal   Past Medical History:  Diagnosis Date  . Arthritis   . Asthma   . Cancer of kidney ([place])    s/p L nephrectomy (renal cell carcinoma)  . Carpal tunnel syndrome   . Compression fracture of lumbar vertebra ([place])   . Depression   . Deviated septum   . Diabetes ([place])   . Fatty liver 03/08/2017  . Fibromyalgia   . Hepatitis A 1980  . History of kidney cancer    Removed left kidney   . Hypercholesteremia   . Hypertension   . Kidney disease   .  Migraine   . Morbid obesity ([place]) 01/06/2017  . Obesity   . Sinus congestion   . Sleep apnea   . Thyroid disease      Past Surgical History:  Procedure Laterality Date  . ANKLE SURGERY    . BACK SURGERY  2007  . COLONOSCOPY WITH ESOPHAGOGASTRODUODENOSCOPY (EGD)  [facility]   [facility]  . HAND SURGERY Right   . KNEE SURGERY Left    X2  . NASAL SEPTUM SURGERY  02/22/2019   turbinate reduction  . NEPHRECTOMY Left 2003  . ROTATOR CUFF REPAIR Right    Family History  Problem Relation Age of Onset  . Glaucoma Maternal Grandmother   . Leukemia Maternal Grandfather   . Heart attack Brother   . Diabetes Brother   . Allergies Brother   . Diabetes Brother   . Allergies Sister   . Asthma Sister   . Heart disease Mother   . Rheumatologic disease Mother   . Lupus Mother   . Angioedema Mother   . Irritable bowel syndrome Mother   . Diverticulosis Mother   . Other Mother 67       Colon Resecction   . Heart disease Father   . Diabetes Father   . Liver disease Father   . Rheumatologic disease Sister   . Cancer Paternal Grandfather  leukemia  . Asthma Child   . Allergic rhinitis Child   . Esophageal cancer Maternal Aunt   . Eczema Neg Hx   . Urticaria Neg Hx    Social History   Tobacco Use  . Smoking status: Former Smoker    Packs/day: 1.00    Years: 10.00    Pack years: 10.00    Types: Cigarettes    Last attempt to quit: 1986    Years since quitting: 34.2  . Smokeless tobacco: Never Used  Substance Use Topics  . Alcohol use: Yes    Alcohol/week: 0.0 standard drinks    Comment: occasionally  . Drug use: Never   Current Outpatient Medications  Medication Sig Dispense Refill  . ALBUTEROL IN Inhale 2 puffs into the lungs as needed.    Marland Kitchen atorvastatin (LIPITOR) 80 MG tablet Take 80 mg by mouth daily.    . benzonatate (TESSALON) 100 MG capsule Take 1 capsule (100 mg total) by mouth 3 (three) times daily as needed for cough. 30 capsule  0  . budesonide-formoterol (SYMBICORT) 160-4.5 MCG/ACT inhaler Inhale 2 puffs into the lungs 2 (two) times daily.    . cetirizine (ZYRTEC) 10 MG tablet Take 10 mg by mouth daily.    . citalopram (CELEXA) 40 MG tablet Take 40 mg by mouth daily.    Marland Kitchen doxycycline (VIBRA-TABS) 100 MG tablet Take 1 tablet (100 mg total) by mouth 2 (two) times daily. Can give caps or generic. 10 tablet 0  . empagliflozin (JARDIANCE) 25 MG TABS tablet Take 25 mg by mouth daily.    . fluticasone (FLONASE) 50 MCG/ACT nasal spray Place 2 sprays into both nostrils daily.    Marland Kitchen gabapentin (NEURONTIN) 600 MG tablet Take 600 mg by mouth 4 (four) times daily.     Marland Kitchen glucagon (GLUCAGEN) 1 MG SOLR injection Inject 1 mg into the vein once as needed for low blood sugar.    . hydrochlorothiazide (HYDRODIURIL) 25 MG tablet Take 25 mg by mouth daily.    . insulin aspart protamine- aspart (NOVOLOG MIX 70/30) (70-30) 100 UNIT/ML injection Inject 62 Units into the skin 2 (two) times daily with a meal.    . Insulin Syringe-Needle U-100 (INSULIN SYRINGE .3CC/31GX5/16") 31G X 5/16" 0.3 ML MISC by Does not apply route.    Marland Kitchen ketotifen (ZADITOR) 0.025 % ophthalmic solution Place 1 drop into both eyes 2 (two) times daily.     . Lancets (ONETOUCH ULTRASOFT) lancets Use as instructed 100 each 2  . levothyroxine (SYNTHROID, LEVOTHROID) 50 MCG tablet Take 1 tablet (50 mcg total) by mouth daily. 30 tablet 3  . Lidocaine HCl (ASPERCREME LIDOCAINE) 4 % LIQD Apply 1 application topically daily as needed.    Marland Kitchen losartan (COZAAR) 25 MG tablet Take 100 mg by mouth daily.     . metFORMIN (GLUCOPHAGE-XR) 500 MG 24 hr tablet Take 1,000 mg by mouth 2 (two) times daily.     . montelukast (SINGULAIR) 10 MG tablet Take 10 mg by mouth at bedtime.    . ONE TOUCH ULTRA TEST test strip USE AS INSTRUCTED TO CHECK BLOOD SUGAR THREE TIMES DAILY. 100 each 5  . pantoprazole (PROTONIX) 40 MG tablet Take 40 mg by mouth daily.    . pioglitazone (ACTOS) 45 MG tablet Take 45 mg  by mouth daily.    . predniSONE (DELTASONE) 10 MG tablet 6 TAB PO DAY 1 5 TAB PO DAY 2 4 TAB PO DAY 3 3 TAB PO DAY 4 2 TAB PO  DAY 5 1 TAB PO DAY 6 21 tablet 0  . ranitidine (ZANTAC) 300 MG capsule Take 300 mg by mouth every evening.    . Semaglutide,0.25 or 0.5MG /DOS, (OZEMPIC, 0.25 OR 0.5 MG/DOSE,) 2 MG/1.5ML SOPN Inject into the skin.    . tamsulosin (FLOMAX) 0.4 MG CAPS capsule Take 1 capsule (0.4 mg total) by mouth daily. 30 capsule 5  . testosterone cypionate (DEPOTESTOSTERONE CYPIONATE) 200 MG/ML injection Inject 200 mg into the muscle.    . urea (CARMOL) 10 % cream Apply topically 3 (three) times daily.     No current facility-administered medications for this visit.    Allergies  Allergen Reactions  . Hydrocodone Itching  . Niacin And Related Hives     Review of Systems: All systems reviewed and negative except where noted in HPI.     Physical Exam:    Wt Readings from Last 3 Encounters:  03/13/19 223 lb 8 oz (101.4 kg)  03/09/19 223 lb (101.2 kg)  02/14/19 225 lb (102.1 kg)    BP 132/86   Pulse 94   Temp 98.3 F (36.8 C)   Ht 5\' 5"  (1.651 m)   Wt 223 lb 8 oz (101.4 kg)   BMI 37.19 kg/m  Constitutional:  Pleasant, in no acute distress. Psychiatric: Normal mood and affect. Behavior is normal. EENT: Pupils normal.  Conjunctivae are normal. No scleral icterus. Neck supple. No cervical LAD. Cardiovascular: Normal rate, regular rhythm. No edema Pulmonary/chest: Effort normal and breath sounds normal. No wheezing, rales or rhonchi. Abdominal: Soft, nondistended, nontender. Bowel sounds active throughout. There are no masses palpable. No hepatomegaly. Neurological: Alert and oriented to person place and time. Skin: Skin is warm and dry. No rashes noted.   ASSESSMENT AND PLAN;   [name] is a 58 y.o. male presenting with:  1) RUQ pain: - HIDA -EGD with gastric biopsies to evaluate for additional mucosal/luminal etiology - Holding off on Probiotic  trial and trial of Bentyl, Symax, etc for now per patient  2) Diarrhea: -Increase dietary fiber and add fiber supplement - Low FODMAP Diet - EGD with duodenal biopsies and colonoscopy with random and directed biopsies to evaluate for luminal/mucosal etiology - Mildly elevated TSH of 5.2 -If unrevealing, can send for infectious etiology to include parasite panel  3) Nausea: - Zofran prn - EGD/Colo in April - If unrevealing, consider GES, particularly with A1c 8.1  4) Bloating: -Evaluate for improvement with dietary modifications as above - Evaluate for mucosal etiology at time of EGD as above  Due to endoscopic restrictions with current COVID-19 pandemic, will perform endoscopic procedures when again available on an elective basis.  The indications, risks, and benefits of EGD and colonoscopy were explained to the patient in detail. Risks include but are not limited to bleeding, perforation, adverse reaction to medications, and cardiopulmonary compromise. Sequelae include but are not limited to the possibility of surgery, hositalization, and mortality. The patient verbalized understanding and wished to proceed. All questions answered, referred to scheduler and bowel prep ordered. Further recommendations pending results of the exam.   RTC in 6 months or sooner as needed    Lavena Bullion, DO, FACG  03/13/2019, 10:56 AM   [name], NP

## 2019-03-15 ENCOUNTER — Telehealth: Payer: Self-pay | Admitting: Gastroenterology

## 2019-03-15 NOTE — Telephone Encounter (Signed)
[name] from [facility] called to inform that pt's HIDA test scheduled for 3/24 needs to be r/s to the next 4 weeks. She will contact pt directly.

## 2019-03-21 ENCOUNTER — Encounter (HOSPITAL_COMMUNITY): Payer: Medicare Other

## 2019-03-22 MED FILL — LEVOTHYROXINE 50 MCG TABLET: 50 | 30 days supply | Qty: 30 | Fill #1

## 2019-04-04 ENCOUNTER — Encounter: Payer: Self-pay | Admitting: Allergy

## 2019-04-04 ENCOUNTER — Ambulatory Visit (INDEPENDENT_AMBULATORY_CARE_PROVIDER_SITE_OTHER): Payer: PRIVATE HEALTH INSURANCE | Admitting: Allergy

## 2019-04-04 ENCOUNTER — Telehealth: Payer: Self-pay

## 2019-04-04 ENCOUNTER — Other Ambulatory Visit: Payer: Self-pay

## 2019-04-04 DIAGNOSIS — J302 Other seasonal allergic rhinitis: Secondary | ICD-10-CM

## 2019-04-04 DIAGNOSIS — J4541 Moderate persistent asthma with (acute) exacerbation: Secondary | ICD-10-CM

## 2019-04-04 DIAGNOSIS — H1010 Acute atopic conjunctivitis, unspecified eye: Secondary | ICD-10-CM | POA: Insufficient documentation

## 2019-04-04 DIAGNOSIS — J3089 Other allergic rhinitis: Secondary | ICD-10-CM | POA: Diagnosis not present

## 2019-04-04 MED ORDER — PREDNISONE 10 MG PO TABS: ORAL_TABLET | ORAL | 0 refills | Status: DC

## 2019-04-04 MED ORDER — TIOTROPIUM BROMIDE MONOHYDRATE 1.25 MCG/ACT IN AERS: 2.0000 | INHALATION_SPRAY | Freq: Every day | RESPIRATORY_TRACT | 3 refills | Status: DC

## 2019-04-04 MED ORDER — OLOPATADINE HCL 0.7 % OP SOLN: 1.0000 [drp] | Freq: Every day | OPHTHALMIC | 3 refills | Status: DC | PRN

## 2019-04-04 MED ORDER — EPINEPHRINE 0.3 MG/0.3ML IJ SOAJ: 0.3000 mg | INTRAMUSCULAR | 2 refills | Status: DC | PRN

## 2019-04-04 MED FILL — PAZEO 0.7% EYE DROPS: 0.7 | 25 days supply | Qty: 3 | Fill #0

## 2019-04-04 MED FILL — EPINEPHRINE 0.3 MG AUTO-INJ: 0.3 | 15 days supply | Qty: 2 | Fill #0

## 2019-04-04 MED FILL — predniSONE 10 MG TABS: 10 | 5 days supply | Qty: 9 | Fill #0

## 2019-04-04 NOTE — Telephone Encounter (Signed)
Garnet Sierras, DO  [facility]        Please call patient and let him know that I sent in epinephrine pen. Required for all patients starting on allergy shots. Thank you.     Lm for pt to call us back

## 2019-04-04 NOTE — Patient Instructions (Addendum)
Allergies:  Follow up with ENT regarding when you can restart the nasal sprays.   Continue environmental control measures.   You can increase zyrtec 10mg  to twice a day.  Continue Singulair 10mg  daily at night.  Take pazeo 1 drop in each eye as needed for itchy/watery eyes. This replaces ketotifen.   Start allergy injections. Will need to sign paperwork at a later date.   Had a detailed discussion with patient/family that clinical history is suggestive of allergic rhinitis, and may benefit from allergy immunotherapy (AIT). Discussed in detail regarding the dosing, schedule, side effects (mild to moderate local allergic reaction and rarely systemic allergic reactions including anaphylaxis), and benefits (significant improvement in nasal symptoms, seasonal flares of asthma) of immunotherapy with the patient. There is significant time commitment involved with allergy shots, which includes weekly immunotherapy injections for first 9-12 months and then biweekly to monthly injections for 3-5 years.   I have prescribed epinephrine injectable and demonstrated proper use. For mild symptoms you can take over the counter antihistamines such as Benadryl and monitor symptoms closely. If symptoms worsen or if you have severe symptoms including breathing issues, throat closure, significant swelling, whole body hives, severe diarrhea and vomiting, lightheadedness then inject epinephrine and seek immediate medical care afterwards.  Asthma: . Daily controller medication(s): continue Symbicort 160 2 puffs twice a day with spacer and rinse mouth afterwards. o Continue Singulair 10mg  daily.  o Start Spiriva 1.28mcg respimat 2 puffs daily. o If symptoms not improved then start oral prednisone 10mg  twice a day for 4 days and then 10mg  once a day for 1 day.  . Prior to physical activity: May use albuterol rescue inhaler 2 puffs 5 to 15 minutes prior to strenuous physical activities. Marland Kitchen Rescue medications: May use  albuterol rescue inhaler 2 puffs or nebulizer every 4 to 6 hours as needed for shortness of breath, chest tightness, coughing, and wheezing. Monitor frequency of use.  . Asthma control goals:  o Full participation in all desired activities (may need albuterol before activity) o Albuterol use two times or less a week on average (not counting use with activity) o Cough interfering with sleep two times or less a month o Oral steroids no more than once a year o No hospitalizations    Follow up in 2 months  Reducing Pollen Exposure . Pollen seasons: trees (spring), grass (summer) and ragweed/weeds (fall). Marland Kitchen Keep windows closed in your home and car to lower pollen exposure.  Susa Simmonds air conditioning in the bedroom and throughout the house if possible.  . Avoid going out in dry windy days - especially early morning. . Pollen counts are highest between 5 - 10 AM and on dry, hot and windy days.  . Save outside activities for late afternoon or after a heavy rain, when pollen levels are lower.  . Avoid mowing of grass if you have grass pollen allergy. Marland Kitchen Be aware that pollen can also be transported indoors on people and pets.  . Dry your clothes in an automatic dryer rather than hanging them outside where they might collect pollen.  . Rinse hair and eyes before bedtime. Mold Control . Mold and fungi can grow on a variety of surfaces provided certain temperature and moisture conditions exist.  . Outdoor molds grow on plants, decaying vegetation and soil. The major outdoor mold, Alternaria and Cladosporium, are found in very high numbers during hot and dry conditions. Generally, a late summer - fall peak is seen for common outdoor fungal  spores. Rain will temporarily lower outdoor mold spore count, but counts rise rapidly when the rainy period ends. . The most important indoor molds are Aspergillus and Penicillium. Dark, humid and poorly ventilated basements are ideal sites for mold growth. The next most  common sites of mold growth are the bathroom and the kitchen. Outdoor (Seasonal) Mold Control . Use air conditioning and keep windows closed. . Avoid exposure to decaying vegetation. Marland Kitchen Avoid leaf raking. . Avoid grain handling. . Consider wearing a face mask if working in moldy areas.  Indoor (Perennial) Mold Control  . Maintain humidity below 50%. . Get rid of mold growth on hard surfaces with water, detergent and, if necessary, 5% bleach (do not mix with other cleaners). Then dry the area completely. If mold covers an area more than 10 square feet, consider hiring an indoor environmental professional. . For clothing, washing with soap and water is best. If moldy items cannot be cleaned and dried, throw them away. . Remove sources e.g. contaminated carpets. . Repair and seal leaking roofs or pipes. Using dehumidifiers in damp basements may be helpful, but empty the water and clean units regularly to prevent mildew from forming. All rooms, especially basements, bathrooms and kitchens, require ventilation and cleaning to deter mold and mildew growth. Avoid carpeting on concrete or damp floors, and storing items in damp areas.

## 2019-04-04 NOTE — Progress Notes (Signed)
RE: [name] MRN: [id number] DOB: 09-14-1961 Date of Telemedicine Visit: 04/04/2019  Referring provider: [name], NP Primary care provider: [name], NP  Chief Complaint: Allergies (scratchy throat) and Itchy Eye   Telemedicine Follow Up Visit via WebEx: I connected with [name] for a follow up on 04/04/19 by WebEx and verified that I am speaking with the correct person using two identifiers.   I discussed the limitations, risks, security and privacy concerns of performing an evaluation and management service by telemedicine and the availability of in person appointments. I also discussed with the patient that there may be a patient responsible charge related to this service. The patient expressed understanding and agreed to proceed.  Patient is at home. Provider is at the office.  Visit start time: 10:23AM Visit end time: 10:57AM Insurance consent/check in by: Benetta Spar Medical consent and medical assistant/nurse: Berniece Andreas  History of Present Illness: He is a 58 y.o. male, who is being followed for asthma and allergic rhinoconjunctivitis. His previous allergy office visit was on 01/31/2019 with Dr. Shaune Leeks. Today is a new complaint visit of Worsening asthma and allergy symptoms.  Allergic rhino conjunctivitis: Patient went to see his doctor at the [place] and recommended that he starts allergy injections. Patient would like to start.   He has been having issues with sneezing, rhinorrhea, itchy throat, itchy/watery eyes. Currently on zyrtec 10mg  in AM and Singulair 10mg  QPM with some control.  Patient had septoplasty in February and was told to stop nasal sprays for now.  Using ketotifen eye drops 2 drops twice a day with some benefit.   Asthma: Currently on Symbicort 160 2 puffs BID but the last few days used rescue inhaler about 3 times a day for coughing, wheezing, chest tightness especially as he has been spending more time outdoors  and doing yardwork.  He had a course of prednisone in February but no other prednisone since then.   Patient's wife was diagnosed with COVID-19 and she is nurse. They have been in self quarantine. Patient himself denies having any fevers, chills, muscle aches or any other symptoms besides the allergies and asthma.   Assessment and Plan: [name] is a 58 y.o. male with: Moderate persistent asthma with (acute) exacerbation Increased symptoms especially when outdoors and doing yard work.  Needed to use albuterol a few times a day the last week or so.  Last oral prednisone was about 2 months ago. . Daily controller medication(s): continue Symbicort 160 2 puffs twice a day with spacer and rinse mouth afterwards. o Continue Singulair 10mg  daily.  o Start Spiriva 1.8mcg respimat 2 puffs daily. o If symptoms not improved then start oral prednisone 10mg  twice a day for 4 days and then 10mg  once a day for 1 day.  . Prior to physical activity: May use albuterol rescue inhaler 2 puffs 5 to 15 minutes prior to strenuous physical activities. Marland Kitchen Rescue medications: May use albuterol rescue inhaler 2 puffs or nebulizer every 4 to 6 hours as needed for shortness of breath, chest tightness, coughing, and wheezing. Monitor frequency of use.  . Check spirometry at next visit.  Seasonal and perennial allergic rhinoconjunctivitis Past history - 2020 skin testing was positive to ragweed, mold grass, trees. Interim history - worsening symptoms since the Spring.  Follow up with ENT regarding when you can restart the nasal sprays.   Continue environmental control measures.   You can increase zyrtec 10mg  twice a day.  Continue Singulair 10mg  daily at  night.  Take pazeo 1 drop in each eye as needed for itchy/watery eyes. This replaces ketotifen.   Start allergy injections. Will need to sign paperwork at a later date.   Had a detailed discussion with patient/family that clinical history is suggestive of allergic  rhinitis, and may benefit from allergy immunotherapy (AIT). Discussed in detail regarding the dosing, schedule, side effects (mild to moderate local allergic reaction and rarely systemic allergic reactions including anaphylaxis), and benefits (significant improvement in nasal symptoms, seasonal flares of asthma) of immunotherapy with the patient. There is significant time commitment involved with allergy shots, which includes weekly immunotherapy injections for first 9-12 months and then biweekly to monthly injections for 3-5 years.   Return in about 2 months (around 06/04/2019).  Meds ordered this encounter  Medications  . Tiotropium Bromide Monohydrate (SPIRIVA RESPIMAT) 1.25 MCG/ACT AERS    Sig: Inhale 2 puffs into the lungs daily.    Dispense:  1 Inhaler    Refill:  3  . predniSONE (DELTASONE) 10 MG tablet    Sig: Take prednisone 10mg  twice a day for 4 days, then 10mg  for 1 day.    Dispense:  9 tablet    Refill:  0  . Olopatadine HCl (PAZEO) 0.7 % SOLN    Sig: Apply 1 drop to eye daily as needed (itchy/watery eyes).    Dispense:  1 Bottle    Refill:  3   Diagnostics: None.  Medication List:  Current Outpatient Medications  Medication Sig Dispense Refill  . ALBUTEROL IN Inhale 2 puffs into the lungs as needed.    Marland Kitchen atorvastatin (LIPITOR) 80 MG tablet Take 80 mg by mouth daily.    . budesonide-formoterol (SYMBICORT) 160-4.5 MCG/ACT inhaler Inhale 2 puffs into the lungs 2 (two) times daily.    . cetirizine (ZYRTEC) 10 MG tablet Take 10 mg by mouth daily.    . citalopram (CELEXA) 40 MG tablet Take 40 mg by mouth daily.    . empagliflozin (JARDIANCE) 25 MG TABS tablet Take 25 mg by mouth daily.    Marland Kitchen gabapentin (NEURONTIN) 600 MG tablet Take 600 mg by mouth 2 (two) times daily.     Marland Kitchen glucagon (GLUCAGEN) 1 MG SOLR injection Inject 1 mg into the vein once as needed for low blood sugar.    . hydrochlorothiazide (HYDRODIURIL) 25 MG tablet Take 25 mg by mouth daily.    . insulin aspart  protamine- aspart (NOVOLOG MIX 70/30) (70-30) 100 UNIT/ML injection Inject 62 Units into the skin 2 (two) times daily with a meal.    . Insulin Syringe-Needle U-100 (INSULIN SYRINGE .3CC/31GX5/16") 31G X 5/16" 0.3 ML MISC by Does not apply route.    Marland Kitchen ketotifen (ZADITOR) 0.025 % ophthalmic solution Place 1 drop into both eyes 2 (two) times daily.     . Lancets (ONETOUCH ULTRASOFT) lancets Use as instructed 100 each 2  . levothyroxine (SYNTHROID, LEVOTHROID) 50 MCG tablet Take 1 tablet (50 mcg total) by mouth daily. 30 tablet 3  . Lidocaine HCl (ASPERCREME LIDOCAINE) 4 % LIQD Apply 1 application topically daily as needed.    Marland Kitchen losartan (COZAAR) 25 MG tablet Take 100 mg by mouth daily.     . metFORMIN (GLUCOPHAGE-XR) 500 MG 24 hr tablet Take 1,000 mg by mouth 2 (two) times daily.     . montelukast (SINGULAIR) 10 MG tablet Take 10 mg by mouth at bedtime.    . ONE TOUCH ULTRA TEST test strip USE AS INSTRUCTED TO CHECK BLOOD SUGAR THREE  TIMES DAILY. 100 each 5  . pantoprazole (PROTONIX) 40 MG tablet Take 40 mg by mouth daily.    . pioglitazone (ACTOS) 45 MG tablet Take 45 mg by mouth daily.    . Semaglutide,0.25 or 0.5MG /DOS, (OZEMPIC, 0.25 OR 0.5 MG/DOSE,) 2 MG/1.5ML SOPN Inject into the skin.    . tamsulosin (FLOMAX) 0.4 MG CAPS capsule Take 1 capsule (0.4 mg total) by mouth daily. 30 capsule 5  . testosterone cypionate (DEPOTESTOSTERONE CYPIONATE) 200 MG/ML injection Inject 200 mg into the muscle.    . urea (CARMOL) 10 % cream Apply topically 3 (three) times daily.    . benzonatate (TESSALON) 100 MG capsule Take 1 capsule (100 mg total) by mouth 3 (three) times daily as needed for cough. (Patient not taking: Reported on 04/04/2019) 30 capsule 0  . fluticasone (FLONASE) 50 MCG/ACT nasal spray Place 2 sprays into both nostrils daily.    . Olopatadine HCl (PAZEO) 0.7 % SOLN Apply 1 drop to eye daily as needed (itchy/watery eyes). 1 Bottle 3  . ondansetron (ZOFRAN) 4 MG tablet Take 1 tablet (4 mg total)  by mouth every 6 (six) hours as needed for nausea or vomiting. (Patient not taking: Reported on 04/04/2019) 30 tablet 1  . predniSONE (DELTASONE) 10 MG tablet Take prednisone 10mg  twice a day for 4 days, then 10mg  for 1 day. 9 tablet 0  . Tiotropium Bromide Monohydrate (SPIRIVA RESPIMAT) 1.25 MCG/ACT AERS Inhale 2 puffs into the lungs daily. 1 Inhaler 3   No current facility-administered medications for this visit.    Allergies: Allergies  Allergen Reactions  . Hydrocodone Itching  . Niacin And Related Hives   I reviewed his past medical history, social history, family history, and environmental history and no significant changes have been reported from previous visit on 01/31/2019.  Review of Systems  Constitutional: Negative for appetite change, chills, fever and unexpected weight change.  HENT: Positive for congestion, rhinorrhea and sneezing.   Eyes: Positive for itching.  Respiratory: Positive for cough, chest tightness, shortness of breath and wheezing.   Gastrointestinal: Negative for abdominal pain.  Skin: Negative for rash.  Allergic/Immunologic: Positive for environmental allergies.  Neurological: Negative for headaches.   Objective: Physical Exam  No visible distress. Not obtained as encounter was done via WebEx.  Previous notes and tests were reviewed.  I discussed the assessment and treatment plan with the patient. The patient was provided an opportunity to ask questions and all were answered. The patient agreed with the plan and demonstrated an understanding of the instructions. After visit summary/patient instructions available via mychart.   The patient was advised to call back or seek an in-person evaluation if the symptoms worsen or if the condition fails to improve as anticipated.  I provided 34 minutes of video-face-to-face time during this encounter.  It was my pleasure to participate in [place] care today. Please feel free to contact me with any  questions or concerns.   Sincerely,  Rexene Alberts, DO Allergy & Immunology  [facility] office: [phone] [facility] office: [phone]

## 2019-04-04 NOTE — Assessment & Plan Note (Signed)
Past history - 2020 skin testing was positive to ragweed, mold grass, trees. Interim history - worsening symptoms since the Spring.  Follow up with ENT regarding when you can restart the nasal sprays.   Continue environmental control measures.   You can increase zyrtec 10mg  twice a day.  Continue Singulair 10mg  daily at night.  Take pazeo 1 drop in each eye as needed for itchy/watery eyes. This replaces ketotifen.   Start allergy injections. Will need to sign paperwork at a later date.   Had a detailed discussion with patient/family that clinical history is suggestive of allergic rhinitis, and may benefit from allergy immunotherapy (AIT). Discussed in detail regarding the dosing, schedule, side effects (mild to moderate local allergic reaction and rarely systemic allergic reactions including anaphylaxis), and benefits (significant improvement in nasal symptoms, seasonal flares of asthma) of immunotherapy with the patient. There is significant time commitment involved with allergy shots, which includes weekly immunotherapy injections for first 9-12 months and then biweekly to monthly injections for 3-5 years.

## 2019-04-04 NOTE — Telephone Encounter (Signed)
Pt informed of the prescription

## 2019-04-04 NOTE — Assessment & Plan Note (Addendum)
Increased symptoms especially when outdoors and doing yard work.  Needed to use albuterol a few times a day the last week or so.  Last oral prednisone was about 2 months ago. . Daily controller medication(s): continue Symbicort 160 2 puffs twice a day with spacer and rinse mouth afterwards. o Continue Singulair 10mg  daily.  o Start Spiriva 1.22mcg respimat 2 puffs daily. o If symptoms not improved then start oral prednisone 10mg  twice a day for 4 days and then 10mg  once a day for 1 day.  . Prior to physical activity: May use albuterol rescue inhaler 2 puffs 5 to 15 minutes prior to strenuous physical activities. Marland Kitchen Rescue medications: May use albuterol rescue inhaler 2 puffs or nebulizer every 4 to 6 hours as needed for shortness of breath, chest tightness, coughing, and wheezing. Monitor frequency of use.  . Check spirometry at next visit.

## 2019-04-12 ENCOUNTER — Telehealth: Payer: Self-pay | Admitting: Allergy

## 2019-04-12 ENCOUNTER — Other Ambulatory Visit: Payer: Self-pay

## 2019-04-12 MED ORDER — TIOTROPIUM BROMIDE MONOHYDRATE 1.25 MCG/ACT IN AERS: 2.0000 | INHALATION_SPRAY | Freq: Every day | RESPIRATORY_TRACT | 3 refills | Status: DC

## 2019-04-12 MED ORDER — EPINEPHRINE 0.3 MG/0.3ML IJ SOAJ: 0.3000 mg | INTRAMUSCULAR | 2 refills | Status: DC | PRN

## 2019-04-12 NOTE — Telephone Encounter (Signed)
PT called to say spiriva is not covered by insurance and is looking for alternative inhaler that is covered. Or if there is a sample or coupon card to help with the cost. Its $110.00

## 2019-04-12 NOTE — Telephone Encounter (Signed)
Spoke [facility] [place] [facility] and they should pay for it

## 2019-04-19 ENCOUNTER — Other Ambulatory Visit: Payer: Self-pay

## 2019-04-19 ENCOUNTER — Telehealth (INDEPENDENT_AMBULATORY_CARE_PROVIDER_SITE_OTHER): Payer: Medicare Other | Admitting: Family

## 2019-04-19 DIAGNOSIS — E039 Hypothyroidism, unspecified: Secondary | ICD-10-CM

## 2019-04-19 DIAGNOSIS — F32A Depression, unspecified: Secondary | ICD-10-CM

## 2019-04-19 DIAGNOSIS — F329 Major depressive disorder, single episode, unspecified: Secondary | ICD-10-CM

## 2019-04-19 DIAGNOSIS — H1010 Acute atopic conjunctivitis, unspecified eye: Secondary | ICD-10-CM

## 2019-04-19 DIAGNOSIS — K219 Gastro-esophageal reflux disease without esophagitis: Secondary | ICD-10-CM

## 2019-04-19 DIAGNOSIS — J3089 Other allergic rhinitis: Secondary | ICD-10-CM

## 2019-04-19 DIAGNOSIS — J302 Other seasonal allergic rhinitis: Secondary | ICD-10-CM | POA: Diagnosis not present

## 2019-04-19 DIAGNOSIS — E119 Type 2 diabetes mellitus without complications: Secondary | ICD-10-CM | POA: Diagnosis not present

## 2019-04-19 DIAGNOSIS — I10 Essential (primary) hypertension: Secondary | ICD-10-CM

## 2019-04-19 NOTE — Progress Notes (Signed)
Virtual Visit via Video Note  I connected with@ on 04/19/19 at 12:40 PM EDT by a video enabled telemedicine application and verified that I am speaking with the correct person using two identifiers. This visit type was conducted due to national recommendations for restrictions regarding the COVID-19 Pandemic (e.g. social distancing).  This format is felt to be most appropriate for this patient at this time.   I discussed the limitations of evaluation and management by telemedicine and the availability of in person appointments. The patient expressed understanding and agreed to proceed.  Only the patient and myself were on today's video visit. The patient was at home and I was in my office at the time of today's visit.   History of Present Illness:  Seasonal allergies- reports that he has had some eye drainage. Uses allergy eye drops from her allergist.  Reports that they have recommended allergy shots.    DM2- reports sugars have been good. Notes last A1C was 7.2 a few weeks ago. Sugars are ranging 88-154.   HTN-  maintained on hctz, losartan. Reports that he is not checking his blood pressure.   GERD- [facility] and was changed from protonix to omeprazole  Depression- reports that he is feeling more energy since he started his testosterone shots.   Weight is down to 215 lbs    Hypothyroid- maintained on synthroid.   Lab Results  Component Value Date   TSH 5.20 (H) 02/14/2019    Observations/Objective:   Gen: Awake, alert, no acute distress Resp: Breathing is even and non-labored Psych: calm/pleasant demeanor Neuro: Alert and Oriented x 3, + facial symmetry, speech is clear.   Assessment and Plan:  1) DM2- clinically stable. A1C is slightly above goal. He continues to work with his endocrinologist at the [place].    2. HTN- patient will have his wife send vital signs including bp this afternoon via mychart.  3. gerd- [facility] stopped [facility] z[facility] and told him to  begin omeprazole instead but he is already on protonix.  I asked him to reach out to his [place] doctor who prescribed this and bring this to his attention.  Depression- reports mood remains stable on citalopram. Monitor.  Seasonal allergies- stable and being followed by allergist.   Hypothyroid- last visit we restarted synthroid.  Will need follow up TSH when we are safely able to bring him into the office, continue synthroid.   Follow Up Instructions:    I discussed the assessment and treatment plan with the patient. The patient was provided an opportunity to ask questions and all were answered. The patient agreed with the plan and demonstrated an understanding of the instructions.   The patient was advised to call back or seek an in-person evaluation if the symptoms worsen or if the condition fails to improve as anticipated.    Nance Pear, NP

## 2019-04-27 ENCOUNTER — Ambulatory Visit (HOSPITAL_COMMUNITY): Payer: Medicare Other

## 2019-05-16 ENCOUNTER — Ambulatory Visit: Payer: Medicare Other | Admitting: Family

## 2019-05-24 ENCOUNTER — Ambulatory Visit (HOSPITAL_COMMUNITY): Admission: RE | Admit: 2019-05-24 | Payer: Medicare Other | Source: Ambulatory Visit

## 2019-06-02 ENCOUNTER — Ambulatory Visit: Payer: PRIVATE HEALTH INSURANCE | Admitting: Allergy

## 2019-06-02 ENCOUNTER — Encounter: Payer: Self-pay | Admitting: Allergy

## 2019-06-02 ENCOUNTER — Telehealth (INDEPENDENT_AMBULATORY_CARE_PROVIDER_SITE_OTHER): Payer: PRIVATE HEALTH INSURANCE | Admitting: Allergy

## 2019-06-02 ENCOUNTER — Other Ambulatory Visit: Payer: Self-pay

## 2019-06-02 DIAGNOSIS — J4540 Moderate persistent asthma, uncomplicated: Secondary | ICD-10-CM | POA: Insufficient documentation

## 2019-06-02 DIAGNOSIS — H1010 Acute atopic conjunctivitis, unspecified eye: Secondary | ICD-10-CM | POA: Diagnosis not present

## 2019-06-02 DIAGNOSIS — J302 Other seasonal allergic rhinitis: Secondary | ICD-10-CM | POA: Diagnosis not present

## 2019-06-02 DIAGNOSIS — J3089 Other allergic rhinitis: Secondary | ICD-10-CM

## 2019-06-02 NOTE — Assessment & Plan Note (Signed)
Past history - 2020 skin testing was positive to ragweed, mold grass, trees. Interim history - worsening symptoms when outdoors.   We mailed you the consent forms to start the allergy injections. Please sign and either mail back or drop off at the office so we can start you on the allergy shots.   Continue environmental control measures.   You can increase zyrtec 10mg  to twice a day.  Continue Singulair 10mg  daily at night.  Nasal saline spray (i.e., Simply Saline) or nasal saline lavage (i.e., NeilMed) is recommended as needed and prior to medicated nasal sprays.  Use Flonase 2 sprays daily after you do the saline wash. Demonstrated proper nasal spray use.   Take pazeo 1 drop in each eye as needed for itchy/watery eyes. Use it before mowing the lawn.  Had a detailed discussion with patient/family that clinical history is suggestive of allergic rhinitis, and may benefit from allergy immunotherapy (AIT). Discussed in detail regarding the dosing, schedule, side effects (mild to moderate local allergic reaction and rarely systemic allergic reactions including anaphylaxis), and benefits (significant improvement in nasal symptoms, seasonal flares of asthma) of immunotherapy with the patient. There is significant time commitment involved with allergy shots, which includes weekly immunotherapy injections for first 9-12 months and then biweekly to monthly injections for 3-5 years.   Start allergy immunotherapy.

## 2019-06-02 NOTE — Progress Notes (Signed)
RE: [name] MRN: [id number] DOB: Dec 14, 1961 Date of Telemedicine Visit: 06/02/2019  Referring provider: [name], NP Primary care provider: [name], NP  Chief Complaint: Allergies (itchy/watery eyes)   Telemedicine Follow Up Visit via MyChart Video: I connected with [name] for a follow up on 06/02/19 by MyChart Video and verified that I am speaking with the correct person using two identifiers.   I discussed the limitations, risks, security and privacy concerns of performing an evaluation and management service by telemedicine and the availability of in person appointments. I also discussed with the patient that there may be a patient responsible charge related to this service. The patient expressed understanding and agreed to proceed.   Patient is at home. Provider is at the office.  Visit start time: 11:30AM Visit end time: 11:57AM Insurance consent/check in by: front desk Medical consent and medical assistant/nurse: Rickard Rhymes.  History of Present Illness: He is a 58 y.o. male, who is being followed for asthma and allergic rhino conjunctivitis. His previous allergy office visit was on 04/04/2019 with Dr. Maudie Mercury. Today is a regular follow up visit.  Moderate persistent asthma Currently on Symbicort 160 2 puffs BID and Spiriva 1.70mcg 2 puffs daily. Noticed significant improvement with the addition of Spiriva.   Flared twice after cutting grass and being in the hot weather. Used rescue inhaler twice during these episodes with good benefit. Usually cuts grass about once a week.  No ER/urgent care visit or oral prednisone since the last OV.  Seasonal and perennial allergic rhinoconjunctivitis Currently on zyrtec 10mg  daily, singulair daily, using eye drops as needed with good benefit.  Using Flonase 2 sprays once a day.  He would like to start on allergy injections.   Significant eye symptoms when outdoors. Has not tried to take zyrtec BID.   Using Flonase but was doing saline wash right afterwards as well.   Assessment and Plan: [name] is a 58 y.o. male with: Moderate persistent asthma without complication Doing much better with Spiriva. No prednisone use since last visit.  Daily controller medication(s):continue Symbicort 160 2 puffs twice a day with spacer and rinse mouth afterwards. ? Continue Singulair 10mg  daily.  ? Continue Spiriva 1.63mcg respimat 2 puffs daily.  Prior to physical activity:May use albuterol rescue inhaler 2 puffs 5 to 15 minutes prior to strenuous physical activities. Marland Kitchen Rescue medications:May use albuterol rescue inhaler 2 puffs or nebulizer every 4 to 6 hours as needed for shortness of breath, chest tightness, coughing, and wheezing. Monitor frequency of use. .  . Check spirometry at next visit.  Seasonal and perennial allergic rhinoconjunctivitis Past history - 2020 skin testing was positive to ragweed, mold grass, trees. Interim history - worsening symptoms when outdoors.   We mailed you the consent forms to start the allergy injections. Please sign and either mail back or drop off at the office so we can start you on the allergy shots.   Continue environmental control measures.   You can increase zyrtec 10mg  to twice a day.  Continue Singulair 10mg  daily at night.  Nasal saline spray (i.e., Simply Saline) or nasal saline lavage (i.e., NeilMed) is recommended as needed and prior to medicated nasal sprays.  Use Flonase 2 sprays daily after you do the saline wash. Demonstrated proper nasal spray use.   Take pazeo 1 drop in each eye as needed for itchy/watery eyes. Use it before mowing the lawn.  Had a detailed discussion with patient/family that clinical history is suggestive of  allergic rhinitis, and may benefit from allergy immunotherapy (AIT). Discussed in detail regarding the dosing, schedule, side effects (mild to moderate local allergic reaction and rarely systemic allergic  reactions including anaphylaxis), and benefits (significant improvement in nasal symptoms, seasonal flares of asthma) of immunotherapy with the patient. There is significant time commitment involved with allergy shots, which includes weekly immunotherapy injections for first 9-12 months and then biweekly to monthly injections for 3-5 years.   Start allergy immunotherapy.   Return in about 3 months (around 09/02/2019).  Diagnostics: None.  Medication List:  Current Outpatient Medications  Medication Sig Dispense Refill  . ALBUTEROL IN Inhale 2 puffs into the lungs as needed.    Marland Kitchen atorvastatin (LIPITOR) 80 MG tablet Take 80 mg by mouth daily.    . cetirizine (ZYRTEC) 10 MG tablet Take 10 mg by mouth daily.    . citalopram (CELEXA) 40 MG tablet Take 40 mg by mouth daily.    . empagliflozin (JARDIANCE) 25 MG TABS tablet Take 25 mg by mouth daily.    Marland Kitchen EPINEPHrine (EPIPEN 2-PAK) 0.3 mg/0.3 mL IJ SOAJ injection Inject 0.3 mLs (0.3 mg total) into the muscle as needed for anaphylaxis. 1 Device 2  . fluticasone (FLONASE) 50 MCG/ACT nasal spray Place 2 sprays into both nostrils daily.    Marland Kitchen gabapentin (NEURONTIN) 600 MG tablet Take 600 mg by mouth 2 (two) times daily.     Marland Kitchen glucagon (GLUCAGEN) 1 MG SOLR injection Inject 1 mg into the vein once as needed for low blood sugar.    . hydrochlorothiazide (HYDRODIURIL) 25 MG tablet Take 25 mg by mouth daily.    . insulin aspart protamine- aspart (NOVOLOG MIX 70/30) (70-30) 100 UNIT/ML injection Inject 62 Units into the skin 2 (two) times daily with a meal.    . Insulin Syringe-Needle U-100 (INSULIN SYRINGE .3CC/31GX5/16") 31G X 5/16" 0.3 ML MISC by Does not apply route.    . Lancets (ONETOUCH ULTRASOFT) lancets Use as instructed 100 each 2  . levothyroxine (SYNTHROID, LEVOTHROID) 50 MCG tablet Take 1 tablet (50 mcg total) by mouth daily. 30 tablet 3  . Lidocaine HCl (ASPERCREME LIDOCAINE) 4 % LIQD Apply 1 application topically daily as needed.    Marland Kitchen losartan  (COZAAR) 25 MG tablet Take 100 mg by mouth daily.     . metFORMIN (GLUCOPHAGE-XR) 500 MG 24 hr tablet Take 1,000 mg by mouth 2 (two) times daily.     . montelukast (SINGULAIR) 10 MG tablet Take 10 mg by mouth at bedtime.    . Olopatadine HCl (PAZEO) 0.7 % SOLN Apply 1 drop to eye daily as needed (itchy/watery eyes). 1 Bottle 3  . ondansetron (ZOFRAN) 4 MG tablet Take 1 tablet (4 mg total) by mouth every 6 (six) hours as needed for nausea or vomiting. 30 tablet 1  . ONE TOUCH ULTRA TEST test strip USE AS INSTRUCTED TO CHECK BLOOD SUGAR THREE TIMES DAILY. 100 each 5  . pantoprazole (PROTONIX) 40 MG tablet Take 40 mg by mouth daily.    . pioglitazone (ACTOS) 45 MG tablet Take 45 mg by mouth daily.    . Semaglutide,0.25 or 0.5MG /DOS, (OZEMPIC, 0.25 OR 0.5 MG/DOSE,) 2 MG/1.5ML SOPN Inject into the skin.    . tamsulosin (FLOMAX) 0.4 MG CAPS capsule Take 1 capsule (0.4 mg total) by mouth daily. 30 capsule 5  . testosterone cypionate (DEPOTESTOSTERONE CYPIONATE) 200 MG/ML injection Inject 200 mg into the muscle.    . Tiotropium Bromide Monohydrate (SPIRIVA RESPIMAT) 1.25 MCG/ACT AERS Inhale  2 puffs into the lungs daily. 1 Inhaler 3  . urea (CARMOL) 10 % cream Apply topically 3 (three) times daily.    Marland Kitchen ketotifen (ZADITOR) 0.025 % ophthalmic solution Place 1 drop into both eyes 2 (two) times daily.      No current facility-administered medications for this visit.    Allergies: Allergies  Allergen Reactions  . Hydrocodone Itching  . Niacin And Related Hives   I reviewed his past medical history, social history, family history, and environmental history and no significant changes have been reported from previous visit on 04/04/2019.  Review of Systems  Constitutional: Negative for appetite change, chills, fever and unexpected weight change.  HENT: Positive for congestion, rhinorrhea and sneezing.   Eyes: Positive for itching.  Respiratory: Positive for cough, chest tightness, shortness of breath and  wheezing.   Gastrointestinal: Negative for abdominal pain.  Skin: Negative for rash.  Allergic/Immunologic: Positive for environmental allergies.  Neurological: Negative for headaches.   Objective: Physical Exam Not obtained as encounter was done via MyChart Video.  Previous notes and tests were reviewed.  I discussed the assessment and treatment plan with the patient. The patient was provided an opportunity to ask questions and all were answered. The patient agreed with the plan and demonstrated an understanding of the instructions. After visit summary/patient instructions available via mychart.   The patient was advised to call back or seek an in-person evaluation if the symptoms worsen or if the condition fails to improve as anticipated.  I provided 27 minutes of video-face-to-face time during this encounter.  It was my pleasure to participate in [facility] care today. Please feel free to contact me with any questions or concerns.   Sincerely,  Rexene Alberts, DO Allergy & Immunology  [facility] office: [phone] [facility] office: [phone]

## 2019-06-02 NOTE — Patient Instructions (Addendum)
Moderate persistent asthma   Take 2 puffs of albuterol 5-10 minutes before you go out to [employer].  Daily controller medication(s):continue Symbicort 160 2 puffs twice a day with spacer and rinse mouth afterwards. ? Continue Singulair 10mg  daily.  ? Continue Spiriva 1.41mcg respimat 2 puffs daily.  Prior to physical activity:May use albuterol rescue inhaler 2 puffs 5 to 15 minutes prior to strenuous physical activities.  Rescue medications:May use albuterol rescue inhaler 2 puffs or nebulizer every 4 to 6 hours as needed for shortness of breath, chest tightness, coughing, and wheezing. Monitor frequency of use.  Asthma control goals:  Full participation in all desired activities (may need albuterol before activity) Albuterol use two times or less a week on average (not counting use with activity) Cough interfering with sleep two times or less a month Oral steroids no more than once a year No hospitalizations  Check spirometry at next visit.  Seasonal and perennial allergic rhinoconjunctivitis Past history - 2020 skin testing was positive to ragweed, mold grass, trees.  We mailed you the consent forms to start the allergy injections. Please sign and either mail back or drop off at the office so we can start you on the allergy shots.   Continue environmental control measures.   You can increase zyrtec 10mg  to twice a day.  Continue Singulair 10mg  daily at night.  Nasal saline spray (i.e., Simply Saline) or nasal saline lavage (i.e., NeilMed) is recommended as needed and prior to medicated nasal sprays.  Use Flonase 2 sprays daily after you do the saline wash and try not to blow your nose for 5-10 minutes afterwards. Make sure you are pointing the tip towards your ears and not towards the middle of the nose when you spray.  Take pazeo 1 drop in each eye as needed for itchy/watery eyes. Use it before mowing the lawn.  Had a detailed discussion with patient/family that  clinical history is suggestive of allergic rhinitis, and may benefit from allergy immunotherapy (AIT). Discussed in detail regarding the dosing, schedule, side effects (mild to moderate local allergic reaction and rarely systemic allergic reactions including anaphylaxis), and benefits (significant improvement in nasal symptoms, seasonal flares of asthma) of immunotherapy with the patient. There is significant time commitment involved with allergy shots, which includes weekly immunotherapy injections for first 9-12 months and then biweekly to monthly injections for 3-5 years.   Follow up in 3 months Sincerely,  Rexene Alberts, DO Allergy & Immunology  [facility] office: [phone] [facility] office: [phone]   Reducing Pollen Exposure . Pollen seasons: trees (spring), grass (summer) and ragweed/weeds (fall). Marland Kitchen Keep windows closed in your home and car to lower pollen exposure.  Susa Simmonds air conditioning in the bedroom and throughout the house if possible.  . Avoid going out in dry windy days - especially early morning. . Pollen counts are highest between 5 - 10 AM and on dry, hot and windy days.  . Save outside activities for late afternoon or after a heavy rain, when pollen levels are lower.  . Avoid mowing of grass if you have grass pollen allergy. Marland Kitchen Be aware that pollen can also be transported indoors on people and pets.  . Dry your clothes in an automatic dryer rather than hanging them outside where they might collect pollen.  . Rinse hair and eyes before bedtime. Mold Control . Mold and fungi can grow on a variety of surfaces provided certain temperature and moisture  conditions exist.  . Outdoor molds grow on plants, decaying vegetation and soil. The major outdoor mold, Alternaria and Cladosporium, are found in very high numbers during hot and dry conditions. Generally, a late summer - fall peak is seen for common outdoor fungal spores. Rain  will temporarily lower outdoor mold spore count, but counts rise rapidly when the rainy period ends. . The most important indoor molds are Aspergillus and Penicillium. Dark, humid and poorly ventilated basements are ideal sites for mold growth. The next most common sites of mold growth are the bathroom and the kitchen. Outdoor (Seasonal) Mold Control . Use air conditioning and keep windows closed. . Avoid exposure to decaying vegetation. Marland Kitchen Avoid leaf raking. . Avoid grain handling. . Consider wearing a face mask if working in moldy areas.  Indoor (Perennial) Mold Control  . Maintain humidity below 50%. . Get rid of mold growth on hard surfaces with water, detergent and, if necessary, 5% bleach (do not mix with other cleaners). Then dry the area completely. If mold covers an area more than 10 square feet, consider hiring an indoor environmental professional. . For clothing, washing with soap and water is best. If moldy items cannot be cleaned and dried, throw them away. . Remove sources e.g. contaminated carpets. . Repair and seal leaking roofs or pipes. Using dehumidifiers in damp basements may be helpful, but empty the water and clean units regularly to prevent mildew from forming. All rooms, especially basements, bathrooms and kitchens, require ventilation and cleaning to deter mold and mildew growth. Avoid carpeting on concrete or damp floors, and storing items in damp areas.

## 2019-06-02 NOTE — Assessment & Plan Note (Signed)
Doing much better with Spiriva. No prednisone use since last visit.  Daily controller medication(s):continue Symbicort 160 2 puffs twice a day with spacer and rinse mouth afterwards. ? Continue Singulair 10mg  daily.  ? Continue Spiriva 1.64mcg respimat 2 puffs daily.  Prior to physical activity:May use albuterol rescue inhaler 2 puffs 5 to 15 minutes prior to strenuous physical activities. Marland Kitchen Rescue medications:May use albuterol rescue inhaler 2 puffs or nebulizer every 4 to 6 hours as needed for shortness of breath, chest tightness, coughing, and wheezing. Monitor frequency of use. .  . Check spirometry at next visit.

## 2019-06-07 ENCOUNTER — Other Ambulatory Visit: Payer: Self-pay | Admitting: Family

## 2019-06-08 ENCOUNTER — Telehealth: Payer: Self-pay | Admitting: *Deleted

## 2019-06-08 NOTE — Telephone Encounter (Signed)
Immunotherapy orders were placed on 04/04/2019. Can you please mix if it wasn't mixed? Patient dropped off consent forms and ready to start.  Thank you.

## 2019-06-08 NOTE — Telephone Encounter (Signed)
-----   Message from Garnet Sierras, DO sent at 06/08/2019  1:23 PM EDT ----- Regarding: FW: allergy shots Did he drop off allergy immunotherapy consent forms? ----- Message ----- From: Garnet Sierras, DO Sent: 06/02/2019   1:55 PM EDT To: Garnet Sierras, DO Subject: allergy shots

## 2019-06-08 NOTE — Telephone Encounter (Signed)
Please call patient and have him schedule for first injection. I will place in order today to start him on allergy injections.

## 2019-06-08 NOTE — Telephone Encounter (Signed)
Yes pt dropped off consent forms today, placed in scanning.

## 2019-06-09 NOTE — Telephone Encounter (Signed)
LVM to sched apt

## 2019-06-12 NOTE — Telephone Encounter (Signed)
Pt was scheduled for his new start allergy injection apt.

## 2019-06-14 NOTE — Progress Notes (Signed)
VIALS EXP 06-13-2020

## 2019-06-15 DIAGNOSIS — J301 Allergic rhinitis due to pollen: Secondary | ICD-10-CM

## 2019-06-21 DIAGNOSIS — J3089 Other allergic rhinitis: Secondary | ICD-10-CM | POA: Diagnosis not present

## 2019-07-03 ENCOUNTER — Other Ambulatory Visit: Payer: Self-pay

## 2019-07-03 ENCOUNTER — Ambulatory Visit (INDEPENDENT_AMBULATORY_CARE_PROVIDER_SITE_OTHER): Payer: PRIVATE HEALTH INSURANCE

## 2019-07-03 DIAGNOSIS — J309 Allergic rhinitis, unspecified: Secondary | ICD-10-CM

## 2019-07-03 NOTE — Progress Notes (Signed)
Immunotherapy   Patient Details  Name: [name] MRN: [id number] Date of Birth: 16-Jun-1961  07/03/2019  [name]  Start allergy injections [id number] and  Mold Following schedule: B Frequency: Weekly Epi-Pen:Yes Consent signed and patient instructions given.   [name] 07/03/2019, 3:13 PM

## 2019-07-12 ENCOUNTER — Ambulatory Visit (INDEPENDENT_AMBULATORY_CARE_PROVIDER_SITE_OTHER): Payer: PRIVATE HEALTH INSURANCE

## 2019-07-12 DIAGNOSIS — J309 Allergic rhinitis, unspecified: Secondary | ICD-10-CM

## 2019-07-19 ENCOUNTER — Other Ambulatory Visit: Payer: Self-pay

## 2019-07-19 ENCOUNTER — Ambulatory Visit (INDEPENDENT_AMBULATORY_CARE_PROVIDER_SITE_OTHER): Payer: Medicare Other | Admitting: Family

## 2019-07-19 DIAGNOSIS — I10 Essential (primary) hypertension: Secondary | ICD-10-CM | POA: Diagnosis not present

## 2019-07-19 DIAGNOSIS — E039 Hypothyroidism, unspecified: Secondary | ICD-10-CM

## 2019-07-19 DIAGNOSIS — R1011 Right upper quadrant pain: Secondary | ICD-10-CM

## 2019-07-19 DIAGNOSIS — E119 Type 2 diabetes mellitus without complications: Secondary | ICD-10-CM

## 2019-07-19 DIAGNOSIS — G4733 Obstructive sleep apnea (adult) (pediatric): Secondary | ICD-10-CM

## 2019-07-19 DIAGNOSIS — R3915 Urgency of urination: Secondary | ICD-10-CM

## 2019-07-19 MED ORDER — LEVOTHYROXINE SODIUM 50 MCG PO TABS: 50.0000 ug | ORAL_TABLET | Freq: Every day | ORAL | 5 refills | Status: DC

## 2019-07-19 MED FILL — LEVOTHYROXINE 50 MCG TABLET: 50 | 30 days supply | Qty: 30 | Fill #0

## 2019-07-19 NOTE — Progress Notes (Signed)
Virtual Visit via Video Note  I connected with [name] on 07/19/19 at 12:40 PM EDT by a video enabled telemedicine application and verified that I am speaking with the correct person using two identifiers.  Location: Patient: home Provider: home   I discussed the limitations of evaluation and management by telemedicine and the availability of in person appointments. The patient expressed understanding and agreed to proceed.  History of Present Illness:  Patient is a 58 yr old male who presents today for follow up.  Reports that he is receiving testosterone injections from the [place] which is improving his energy.   HTN- He reports that his bp has been "normal range." at [place] 124/78 last month.  He continues losartan and hctz.   BP Readings from Last 3 Encounters:  03/13/19 132/86  03/09/19 139/87  02/14/19 112/72   DM2- He reports that his sugars have been good.  Reports A1C at the [place] 2 months ago was 6.9. They placed him on ozempic.  Continues to follow with the [facility] for endocrinology.   Lab Results  Component Value Date   HGBA1C 8.1 (H) 02/14/2019   HGBA1C 8.2 (H) 02/05/2017   HGBA1C 7.6 (H) 12/10/2015   Lab Results  Component Value Date   MICROALBUR 22.5 (H) 12/10/2015   LDLCALC 88 02/14/2019   CREATININE 1.38 02/14/2019   Hypothyroid- maintained on synthroid 50 mcg.   Lab Results  Component Value Date   TSH 5.20 (H) 02/14/2019   Hyperlipidemia- maintained on lipitor 80mg .  Lab Results  Component Value Date   CHOL 160 02/14/2019   HDL 36.00 (L) 02/14/2019   LDLCALC 88 02/14/2019   TRIG 180.0 (H) 02/14/2019   CHOLHDL 4 02/14/2019    OSA- continues cpap and reports good compliance.   He complains of recent urinary urgency.   Reports that he has lost weight- down to 207.  Notes that he never followed through with the HIDA due to COVID-19.  Reports that he has intermittent bloating/gas/pain up under the ribs on the right side. Also reports associated nausea  which is the "main debilitating symptom."   Wt Readings from Last 3 Encounters:  03/13/19 223 lb 8 oz (101.4 kg)  03/09/19 223 lb (101.2 kg)  02/14/19 225 lb (102.1 kg)   Past Medical History:  Diagnosis Date  . Arthritis   . Asthma   . Cancer of kidney ([place])    s/p L nephrectomy (renal cell carcinoma)  . Carpal tunnel syndrome   . Compression fracture of lumbar vertebra ([place])   . Depression   . Deviated septum   . Diabetes ([place])   . Fatty liver 03/08/2017  . Fibromyalgia   . Hepatitis A 1980  . History of kidney cancer    Removed left kidney   . Hypercholesteremia   . Hypertension   . Kidney disease   . Migraine   . Morbid obesity ([place]) 01/06/2017  . Obesity   . Sinus congestion   . Sleep apnea   . Thyroid disease      Social History   Socioeconomic History  . Marital status: Married    Spouse name: Not on file  . Number of children: 1  . Years of education: Not on file  . Highest education level: Not on file  Occupational History  . Occupation: Retired  Electrical engineer   Social Needs  . Financial resource strain: Not on file  . Food insecurity    Worry: Not on file  Inability: Not on file  . Transportation needs    Medical: Not on file    Non-medical: Not on file  Tobacco Use  . Smoking status: Former Smoker    Packs/day: 1.00    Years: 10.00    Pack years: 10.00    Types: Cigarettes    Quit date: 1986    Years since quitting: 34.5  . Smokeless tobacco: Never Used  Substance and Sexual Activity  . Alcohol use: Yes    Alcohol/week: 0.0 standard drinks    Comment: occasionally  . Drug use: Never  . Sexual activity: Not on file  Lifestyle  . Physical activity    Days per week: Not on file    Minutes per session: Not on file  . Stress: Not on file  Relationships  . Social Herbalist on phone: Not on file    Gets together: Not on file    Attends religious service: Not on file    Active member of club or organization: Not on file     Attends meetings of clubs or organizations: Not on file    Relationship status: Not on file  . Intimate partner violence    Fear of current or ex partner: Not on file    Emotionally abused: Not on file    Physically abused: Not on file    Forced sexual activity: Not on file  Other Topics Concern  . 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 ANKLE SURGERY    . BACK SURGERY  2007  . COLONOSCOPY WITH ESOPHAGOGASTRODUODENOSCOPY (EGD)  [facility]   [facility]  . HAND SURGERY Right   . KNEE SURGERY Left    X2  . NASAL SEPTUM SURGERY  02/22/2019   turbinate reduction  . NEPHRECTOMY Left 2003  . ROTATOR CUFF REPAIR Right     Family History  Problem Relation Age of Onset  . Glaucoma Maternal Grandmother   . Leukemia Maternal Grandfather   . Heart attack Brother   . Diabetes Brother   . Allergies Brother   . Diabetes Brother   . Allergies Sister   . Asthma Sister   . Heart disease Mother   . Rheumatologic disease Mother   . Lupus Mother   . Angioedema Mother   . Irritable bowel syndrome Mother   . Diverticulosis Mother   . Other Mother 51       Colon Resecction   . Heart disease Father   . Diabetes Father   . Liver disease Father   . Rheumatologic disease Sister   . Cancer Paternal Grandfather        leukemia  . Asthma Child   . Allergic rhinitis Child   . Esophageal cancer Maternal Aunt   . Eczema Neg Hx   . Urticaria Neg Hx     Allergies  Allergen Reactions  . Hydrocodone Itching  . Niacin And Related Hives    Current Outpatient Medications on File Prior to Visit  Medication Sig Dispense Refill  . ALBUTEROL IN Inhale 2 puffs  into the lungs as needed.    Marland Kitchen  atorvastatin (LIPITOR) 80 MG tablet Take 80 mg by mouth daily.    . cetirizine (ZYRTEC) 10 MG tablet Take 10 mg by mouth daily.    . citalopram (CELEXA) 40 MG tablet Take 40 mg by mouth daily.    . empagliflozin (JARDIANCE) 25 MG TABS tablet Take 25 mg by mouth daily.    Marland Kitchen EPINEPHrine (EPIPEN 2-PAK) 0.3 mg/0.3 mL IJ SOAJ injection Inject 0.3 mLs (0.3 mg total) into the muscle as needed for anaphylaxis. 1 Device 2  . fluticasone (FLONASE) 50 MCG/ACT nasal spray Place 2 sprays into both nostrils daily.    Marland Kitchen gabapentin (NEURONTIN) 600 MG tablet Take 600 mg by mouth 2 (two) times daily.     Marland Kitchen glucagon (GLUCAGEN) 1 MG SOLR injection Inject 1 mg into the vein once as needed for low blood sugar.    . hydrochlorothiazide (HYDRODIURIL) 25 MG tablet Take 25 mg by mouth daily.    . insulin aspart protamine- aspart (NOVOLOG MIX 70/30) (70-30) 100 UNIT/ML injection Inject 62 Units into the skin 2 (two) times daily with a meal.    . Insulin Syringe-Needle U-100 (INSULIN SYRINGE .3CC/31GX5/16") 31G X 5/16" 0.3 ML MISC by Does not apply route.    Marland Kitchen ketotifen (ZADITOR) 0.025 % ophthalmic solution Place 1 drop into both eyes 2 (two) times daily.     . Lancets (ONETOUCH ULTRASOFT) lancets Use as instructed 100 each 2  . levothyroxine (SYNTHROID, LEVOTHROID) 50 MCG tablet Take 1 tablet (50 mcg total) by mouth daily. 30 tablet 3  . Lidocaine HCl (ASPERCREME LIDOCAINE) 4 % LIQD Apply 1 application topically daily as needed.    Marland Kitchen losartan (COZAAR) 25 MG tablet Take 100 mg by mouth daily.     . metFORMIN (GLUCOPHAGE-XR) 500 MG 24 hr tablet Take 1,000 mg by mouth 2 (two) times daily.     . montelukast (SINGULAIR) 10 MG tablet Take 10 mg by mouth at bedtime.    . Olopatadine HCl (PAZEO) 0.7 % SOLN Apply 1 drop to eye daily as needed (itchy/watery eyes). 1 Bottle 3  . ondansetron (ZOFRAN) 4 MG tablet Take 1 tablet (4 mg total) by mouth every 6 (six) hours as needed for nausea or vomiting. 30  tablet 1  . ONE TOUCH ULTRA TEST test strip USE AS INSTRUCTED TO CHECK BLOOD SUGAR THREE TIMES DAILY. 100 each 5  . pantoprazole (PROTONIX) 40 MG tablet Take 40 mg by mouth daily.    . pioglitazone (ACTOS) 45 MG tablet Take 45 mg by mouth daily.    . Semaglutide,0.25 or 0.5MG /DOS, (OZEMPIC, 0.25 OR 0.5 MG/DOSE,) 2 MG/1.5ML SOPN Inject into the skin.    . tamsulosin (FLOMAX) 0.4 MG CAPS capsule TAKE 1 CAPSULE (0.4 MG TOTAL) BY MOUTH DAILY. 30 capsule 5  . testosterone cypionate (DEPOTESTOSTERONE CYPIONATE) 200 MG/ML injection Inject 200 mg into the muscle.    . Tiotropium Bromide Monohydrate (SPIRIVA RESPIMAT) 1.25 MCG/ACT AERS Inhale 2 puffs into the lungs daily. 1 Inhaler 3  . urea (CARMOL) 10 % cream Apply topically 3 (three) times daily.     No current facility-administered medications on file prior to visit.     There were no vitals taken for this visit.    Observations/Objective:   Gen: Awake, alert, no acute distress Resp: Breathing is even and non-labored Psych: calm/pleasant demeanor Neuro: Alert and Oriented x 3, + facial symmetry, speech is clear.   Assessment and Plan:  Hypothyroid- due for follow up tsh. Will order.  Urinary urgency- ? Secondary  to bph, continue flomax, will obtain urine culture to exclude uti.  DM2- reports improved control on ozempic and since he has lost weight. This is being managed by the [place].  Hyperlipidemia- continues statin, LDL at goal.  OSA- clinically stable on bipap. This is managed by Dr. Corrie Dandy.    RUQ pain- I have advised him to reach out to GI to reschedule his HIDA scan for further evaluation.    Follow Up Instructions:    I discussed the assessment and treatment plan with the patient. The patient was provided an opportunity to ask questions and all were answered. The patient agreed with the plan and demonstrated an understanding of the instructions.   The patient was advised to call back or seek an in-person evaluation if  the symptoms worsen or if the condition fails to improve as anticipated.  Nance Pear, NP

## 2019-07-20 ENCOUNTER — Ambulatory Visit (INDEPENDENT_AMBULATORY_CARE_PROVIDER_SITE_OTHER): Payer: PRIVATE HEALTH INSURANCE

## 2019-07-20 DIAGNOSIS — J309 Allergic rhinitis, unspecified: Secondary | ICD-10-CM | POA: Diagnosis not present

## 2019-07-21 ENCOUNTER — Other Ambulatory Visit: Payer: Self-pay

## 2019-07-21 ENCOUNTER — Other Ambulatory Visit (INDEPENDENT_AMBULATORY_CARE_PROVIDER_SITE_OTHER): Payer: Medicare Other

## 2019-07-21 DIAGNOSIS — E039 Hypothyroidism, unspecified: Secondary | ICD-10-CM

## 2019-07-21 DIAGNOSIS — E119 Type 2 diabetes mellitus without complications: Secondary | ICD-10-CM | POA: Diagnosis not present

## 2019-07-21 DIAGNOSIS — R3915 Urgency of urination: Secondary | ICD-10-CM | POA: Diagnosis not present

## 2019-07-21 LAB — BASIC METABOLIC PANEL
BUN: 18 mg/dL (ref 6–23)
CO2: 25 mEq/L (ref 19–32)
Calcium: 9.4 mg/dL (ref 8.4–10.5)
Chloride: 106 mEq/L (ref 96–112)
Creatinine, Ser: 1.15 mg/dL (ref 0.40–1.50)
GFR: 65.6 mL/min (ref >60.00)
Glucose, Bld: 109 mg/dL — ABNORMAL HIGH (ref 70–99)
Potassium: 3.8 mEq/L (ref 3.5–5.1)
Sodium: 140 mEq/L (ref 135–145)

## 2019-07-21 LAB — URINALYSIS, ROUTINE W REFLEX MICROSCOPIC
Bilirubin Urine: NEGATIVE
Hgb urine dipstick: NEGATIVE
Ketones, ur: NEGATIVE
Leukocytes,Ua: NEGATIVE
Nitrite: NEGATIVE
RBC / HPF: NONE SEEN (ref 0–?)
Specific Gravity, Urine: 1.02 (ref 1.000–1.030)
Total Protein, Urine: 100 — AB
Urine Glucose: >=1000 — AB
Urobilinogen, UA: 1 (ref 0.0–1.0)
pH: 6 (ref 5.0–8.0)

## 2019-07-21 LAB — TSH: TSH: 4.59 u[IU]/mL — ABNORMAL HIGH (ref 0.35–4.50)

## 2019-07-21 MED FILL — TAMSULOSIN HCL 0.4 MG CAP: 0.4 | 30 days supply | Qty: 30 | Fill #0

## 2019-07-23 LAB — URINE CULTURE
MICRO NUMBER:: 701457
SPECIMEN QUALITY:: ADEQUATE

## 2019-07-24 ENCOUNTER — Telehealth: Payer: Self-pay | Admitting: Family

## 2019-07-24 DIAGNOSIS — E039 Hypothyroidism, unspecified: Secondary | ICD-10-CM

## 2019-07-24 MED ORDER — LEVOTHYROXINE SODIUM 75 MCG PO TABS: 75.0000 ug | ORAL_TABLET | Freq: Every day | ORAL | 3 refills | Status: AC

## 2019-07-24 MED FILL — LEVOTHYROXINE 75 MCG TABLET: 75 | 30 days supply | Qty: 30 | Fill #0

## 2019-07-24 NOTE — Telephone Encounter (Signed)
Urine culture negative for infection.  Synthroid needs to be increased.   Please increase synthroid form 50 mcg to 88mcg.  Repeat TSH in 6 weeks.  Orders have been placed. Rx sent [place] to pharmacy.

## 2019-07-25 NOTE — Telephone Encounter (Signed)
Results and new dose information given to patient. He is scheduled forlabs 09-06-19.

## 2019-07-28 ENCOUNTER — Ambulatory Visit (INDEPENDENT_AMBULATORY_CARE_PROVIDER_SITE_OTHER): Payer: PRIVATE HEALTH INSURANCE

## 2019-07-28 DIAGNOSIS — J309 Allergic rhinitis, unspecified: Secondary | ICD-10-CM | POA: Diagnosis not present

## 2019-08-07 ENCOUNTER — Ambulatory Visit (INDEPENDENT_AMBULATORY_CARE_PROVIDER_SITE_OTHER): Payer: PRIVATE HEALTH INSURANCE

## 2019-08-07 DIAGNOSIS — J309 Allergic rhinitis, unspecified: Secondary | ICD-10-CM | POA: Diagnosis not present

## 2019-08-14 ENCOUNTER — Ambulatory Visit (INDEPENDENT_AMBULATORY_CARE_PROVIDER_SITE_OTHER): Payer: PRIVATE HEALTH INSURANCE

## 2019-08-14 DIAGNOSIS — J309 Allergic rhinitis, unspecified: Secondary | ICD-10-CM

## 2019-08-21 ENCOUNTER — Ambulatory Visit (INDEPENDENT_AMBULATORY_CARE_PROVIDER_SITE_OTHER): Payer: PRIVATE HEALTH INSURANCE

## 2019-08-21 DIAGNOSIS — J309 Allergic rhinitis, unspecified: Secondary | ICD-10-CM

## 2019-08-29 ENCOUNTER — Ambulatory Visit (INDEPENDENT_AMBULATORY_CARE_PROVIDER_SITE_OTHER): Payer: PRIVATE HEALTH INSURANCE

## 2019-08-29 DIAGNOSIS — J309 Allergic rhinitis, unspecified: Secondary | ICD-10-CM | POA: Diagnosis not present

## 2019-09-05 ENCOUNTER — Ambulatory Visit (INDEPENDENT_AMBULATORY_CARE_PROVIDER_SITE_OTHER): Payer: PRIVATE HEALTH INSURANCE

## 2019-09-05 DIAGNOSIS — J309 Allergic rhinitis, unspecified: Secondary | ICD-10-CM | POA: Diagnosis not present

## 2019-09-06 ENCOUNTER — Other Ambulatory Visit (INDEPENDENT_AMBULATORY_CARE_PROVIDER_SITE_OTHER): Payer: Medicare Other

## 2019-09-06 ENCOUNTER — Other Ambulatory Visit: Payer: Self-pay

## 2019-09-06 DIAGNOSIS — E039 Hypothyroidism, unspecified: Secondary | ICD-10-CM

## 2019-09-06 LAB — TSH: TSH: 2.52 u[IU]/mL (ref 0.35–4.50)

## 2019-09-06 MED FILL — LEVOTHYROXINE 75 MCG TABLET: 75 | 30 days supply | Qty: 30 | Fill #1

## 2019-09-07 NOTE — Progress Notes (Deleted)
Follow Up Note  RE: [name] MRN: [id number] DOB: April 12, 1961 Date of Office Visit: 09/08/2019  Referring provider: [name], NP Primary care provider: [name], NP  Chief Complaint: No chief complaint on file.  History of Present Illness: I had the pleasure of seeing [name] for a follow up visit at the [facility] on 09/07/2019. He is a 58 y.o. male, who is being followed for asthma, allergic rhinoconjunctivitis. Today he is here for regular follow up visit. His previous allergy office visit was on 06/02/2019 with Dr. Maudie Mercury via telemedicine.   Moderate persistent asthma without complication Doing much better with Spiriva. No prednisone use since last visit.  Daily controller medication(s):continue Symbicort 160 2 puffs twice a day with spacer and rinse mouth afterwards. ? Continue Singulair 10mg  daily.  ? Continue Spiriva 1.91mcg respimat 2 puffs daily.  Prior to physical activity:May use albuterol rescue inhaler 2 puffs 5 to 15 minutes prior to strenuous physical activities.  Rescue medications:May use albuterol rescue inhaler 2 puffs or nebulizer every 4 to 6 hours as needed for shortness of breath, chest tightness, coughing, and wheezing. Monitor frequency of use..   Check spirometry at next visit.  Seasonal and perennial allergic rhinoconjunctivitis Past history - 2020 skin testing was positive to ragweed, mold grass, trees. Interim history - worsening symptoms when outdoors.   We mailed you the consent forms to start the allergy injections. Please sign and either mail back or drop off at the office so we can start you on the allergy shots.   Continue environmental control measures.   You can increase zyrtec 10mg  to twice a day.  Continue Singulair 10mg  daily at night.  Nasal saline spray (i.e., Simply Saline) or nasal saline lavage (i.e., NeilMed) is recommended as needed and prior to medicated nasal  sprays.  Use Flonase 2 sprays daily after you do the saline wash. Demonstrated proper nasal spray use.   Take pazeo 1 drop in each eye as needed for itchy/watery eyes. Use it before mowing the lawn.  Had a detailed discussion with patient/family that clinical history is suggestive of allergic rhinitis, and may benefit from allergy immunotherapy (AIT). Discussed in detail regarding the dosing, schedule, side effects (mild to moderate local allergic reaction and rarely systemic allergic reactions including anaphylaxis), and benefits (significant improvement in nasal symptoms, seasonal flares of asthma) of immunotherapy with the patient. There is significant time commitment involved with allergy shots, which includes weekly immunotherapy injections for first 9-12 months and then biweekly to monthly injections for 3-5 years.  Start allergy immunotherapy.   Return in about 3 months (around 09/02/2019). 07/03/2019  [name]  Start allergy injections [id number] and  Mold Following schedule: B Frequency: Weekly Epi-Pen:Yes Consent signed and patient instructions given.   Assessment and Plan: [name] is a 58 y.o. male with: No problem-specific Assessment & Plan notes found for this encounter.  No follow-ups on file.  No orders of the defined types were placed in this encounter.  Lab Orders  No laboratory test(s) ordered today    Diagnostics: Spirometry:  Tracings reviewed. His effort: {Blank single:19197::"Good reproducible efforts.","It was hard to get consistent efforts and there is a question as to whether this reflects a maximal maneuver.","Poor effort, data can not be interpreted."} FVC: ***L FEV1: ***L, ***% predicted FEV1/FVC ratio: ***% Interpretation: {Blank single:19197::"Spirometry consistent with mild obstructive disease","Spirometry consistent with moderate obstructive disease","Spirometry consistent with severe obstructive disease","Spirometry consistent with possible  restrictive disease","Spirometry consistent  with mixed obstructive and restrictive disease","Spirometry uninterpretable due to technique","Spirometry consistent with normal pattern","No overt abnormalities noted given today's efforts"}.  Please see scanned spirometry results for details.  Skin Testing: {Blank single:19197::"Select foods","Environmental allergy panel","Environmental allergy panel and select foods","Food allergy panel","None","Deferred due to recent antihistamines use"}. Positive test to: ***. Negative test to: ***.  Results discussed with patient/family.   Medication List:  Current Outpatient Medications  Medication Sig Dispense Refill   ALBUTEROL IN Inhale 2 puffs into the lungs as needed.     atorvastatin (LIPITOR) 80 MG tablet Take 80 mg by mouth daily.     cetirizine (ZYRTEC) 10 MG tablet Take 10 mg by mouth daily.     citalopram (CELEXA) 40 MG tablet Take 40 mg by mouth daily.     empagliflozin (JARDIANCE) 25 MG TABS tablet Take 25 mg by mouth daily.     EPINEPHrine (EPIPEN 2-PAK) 0.3 mg/0.3 mL IJ SOAJ injection Inject 0.3 mLs (0.3 mg total) into the muscle as needed for anaphylaxis. 1 Device 2   fluticasone (FLONASE) 50 MCG/ACT nasal spray Place 2 sprays into both nostrils daily.     gabapentin (NEURONTIN) 600 MG tablet Take 600 mg by mouth 2 (two) times daily.      glucagon (GLUCAGEN) 1 MG SOLR injection Inject 1 mg into the vein once as needed for low blood sugar.     hydrochlorothiazide (HYDRODIURIL) 25 MG tablet Take 25 mg by mouth daily.     insulin aspart protamine- aspart (NOVOLOG MIX 70/30) (70-30) 100 UNIT/ML injection Inject 62 Units into the skin 2 (two) times daily with a meal.     Insulin Syringe-Needle U-100 (INSULIN SYRINGE .3CC/31GX5/16") 31G X 5/16" 0.3 ML MISC by Does not apply route.     ketotifen (ZADITOR) 0.025 % ophthalmic solution Place 1 drop into both eyes 2 (two) times daily.      Lancets (ONETOUCH ULTRASOFT) lancets Use as  instructed 100 each 2   levothyroxine (SYNTHROID) 75 MCG tablet Take 1 tablet (75 mcg total) by mouth daily. 30 tablet 3   Lidocaine HCl (ASPERCREME LIDOCAINE) 4 % LIQD Apply 1 application topically daily as needed.     losartan (COZAAR) 25 MG tablet Take 100 mg by mouth daily.      metFORMIN (GLUCOPHAGE-XR) 500 MG 24 hr tablet Take 1,000 mg by mouth 2 (two) times daily.      montelukast (SINGULAIR) 10 MG tablet Take 10 mg by mouth at bedtime.     Olopatadine HCl (PAZEO) 0.7 % SOLN Apply 1 drop to eye daily as needed (itchy/watery eyes). 1 Bottle 3   ondansetron (ZOFRAN) 4 MG tablet Take 1 tablet (4 mg total) by mouth every 6 (six) hours as needed for nausea or vomiting. 30 tablet 1   ONE TOUCH ULTRA TEST test strip USE AS INSTRUCTED TO CHECK BLOOD SUGAR THREE TIMES DAILY. 100 each 5   pantoprazole (PROTONIX) 40 MG tablet Take 40 mg by mouth daily.     pioglitazone (ACTOS) 45 MG tablet Take 45 mg by mouth daily.     Semaglutide,0.25 or 0.5MG /DOS, (OZEMPIC, 0.25 OR 0.5 MG/DOSE,) 2 MG/1.5ML SOPN Inject into the skin.     tamsulosin (FLOMAX) 0.4 MG CAPS capsule TAKE 1 CAPSULE (0.4 MG TOTAL) BY MOUTH DAILY. 30 capsule 5   testosterone cypionate (DEPOTESTOSTERONE CYPIONATE) 200 MG/ML injection Inject 200 mg into the muscle.     Tiotropium Bromide Monohydrate (SPIRIVA RESPIMAT) 1.25 MCG/ACT AERS Inhale 2 puffs into the lungs daily. 1 Inhaler 3  urea (CARMOL) 10 % cream Apply topically 3 (three) times daily.     No current facility-administered medications for this visit.    Allergies: Allergies  Allergen Reactions   Hydrocodone Itching   Niacin And Related Hives   I reviewed his past medical history, social history, family history, and environmental history and no significant changes have been reported from previous visit on 06/02/2019.  Review of Systems  Constitutional: Negative for appetite change, chills, fever and unexpected weight change.  HENT: Positive for congestion,  rhinorrhea and sneezing.   Eyes: Positive for itching.  Respiratory: Positive for cough, chest tightness, shortness of breath and wheezing.   Gastrointestinal: Negative for abdominal pain.  Skin: Negative for rash.  Allergic/Immunologic: Positive for environmental allergies.  Neurological: Negative for headaches.   Objective: There were no vitals taken for this visit. There is no height or weight on file to calculate BMI. Physical Exam  Constitutional: He is oriented to person, place, and time. He appears well-developed and well-nourished.  HENT:  Head: Normocephalic and atraumatic.  Right Ear: External ear normal.  Left Ear: External ear normal.  Nose: Nose normal.  Mouth/Throat: Oropharynx is clear and moist.  Eyes: Conjunctivae and EOM are normal.  Neck: Neck supple.  Cardiovascular: Normal rate, regular rhythm and normal heart sounds. Exam reveals no gallop and no friction rub.  No murmur heard. Pulmonary/Chest: Effort normal and breath sounds normal. He has no wheezes. He has no rales.  Neurological: He is alert and oriented to person, place, and time.  Skin: Skin is warm. No rash noted.  Psychiatric: He has a normal mood and affect. His behavior is normal.  Nursing note and vitals reviewed.  Previous notes and tests were reviewed. The plan was reviewed with the patient/family, and all questions/concerned were addressed.  It was my pleasure to see [name] today and participate in his care. Please feel free to contact me with any questions or concerns.  Sincerely,  Rexene Alberts, DO Allergy & Immunology  [facility] office: [phone] [facility] office: [place] office: [phone]

## 2019-09-08 ENCOUNTER — Ambulatory Visit: Payer: PRIVATE HEALTH INSURANCE | Admitting: Allergy

## 2019-09-12 ENCOUNTER — Ambulatory Visit (INDEPENDENT_AMBULATORY_CARE_PROVIDER_SITE_OTHER): Payer: PRIVATE HEALTH INSURANCE

## 2019-09-12 DIAGNOSIS — J309 Allergic rhinitis, unspecified: Secondary | ICD-10-CM

## 2019-09-19 ENCOUNTER — Ambulatory Visit (INDEPENDENT_AMBULATORY_CARE_PROVIDER_SITE_OTHER): Payer: PRIVATE HEALTH INSURANCE

## 2019-09-19 DIAGNOSIS — J309 Allergic rhinitis, unspecified: Secondary | ICD-10-CM | POA: Diagnosis not present

## 2019-09-21 NOTE — Progress Notes (Deleted)
Follow Up Note  RE: MCCAIN BUCHBERGER MRN: [id number] DOB: 14-Jan-1961 Date of Office Visit: 09/22/2019  Referring provider: [name], NP Primary care provider: [name], NP  Chief Complaint: No chief complaint on file.  History of Present Illness: I had the pleasure of seeing [name] for a follow up visit at the [facility] on 09/21/2019. He is a 58 y.o. male, who is being followed for asthma, allergic rhinoconjunctivitis. Today he is here for regular follow up visit. His previous allergy office visit was on 06/02/2019 with Dr. Maudie Mercury via telemedicine.   07/03/2019  Loralee Pacas Haigler  Start allergy injections G-RW-T and  Mold Following schedule: B Frequency: Weekly  Moderate persistent asthma without complication Doing much better with Spiriva. No prednisone use since last visit.  Daily controller medication(s):continue Symbicort 160 2 puffs twice a day with spacer and rinse mouth afterwards. ? Continue Singulair 10mg  daily.  ? Continue Spiriva 1.6mcg respimat 2 puffs daily.  Prior to physical activity:May use albuterol rescue inhaler 2 puffs 5 to 15 minutes prior to strenuous physical activities.  Rescue medications:May use albuterol rescue inhaler 2 puffs or nebulizer every 4 to 6 hours as needed for shortness of breath, chest tightness, coughing, and wheezing. Monitor frequency of use..   Check spirometry at next visit.  Seasonal and perennial allergic rhinoconjunctivitis Past history - 2020 skin testing was positive to ragweed, mold grass, trees. Interim history - worsening symptoms when outdoors.   We mailed you the consent forms to start the allergy injections. Please sign and either mail back or drop off at the office so we can start you on the allergy shots.   Continue environmental control measures.   You can increase zyrtec 10mg  to twice a day.  Continue Singulair 10mg  daily at night.  Nasal saline spray  (i.e., Simply Saline) or nasal saline lavage (i.e., NeilMed) is recommended as needed and prior to medicated nasal sprays.  Use Flonase 2 sprays daily after you do the saline wash. Demonstrated proper nasal spray use.   Take pazeo 1 drop in each eye as needed for itchy/watery eyes. Use it before mowing the lawn.  Had a detailed discussion with patient/family that clinical history is suggestive of allergic rhinitis, and may benefit from allergy immunotherapy (AIT). Discussed in detail regarding the dosing, schedule, side effects (mild to moderate local allergic reaction and rarely systemic allergic reactions including anaphylaxis), and benefits (significant improvement in nasal symptoms, seasonal flares of asthma) of immunotherapy with the patient. There is significant time commitment involved with allergy shots, which includes weekly immunotherapy injections for first 9-12 months and then biweekly to monthly injections for 3-5 years.  Start allergy immunotherapy.   Return in about 3 months (around 09/02/2019).  Assessment and Plan: Gregry is a 58 y.o. male with: No problem-specific Assessment & Plan notes found for this encounter.  No follow-ups on file.  No orders of the defined types were placed in this encounter.  Lab Orders  No laboratory test(s) ordered today    Diagnostics: Spirometry:  Tracings reviewed. His effort: {Blank single:19197::"Good reproducible efforts.","It was hard to get consistent efforts and there is a question as to whether this reflects a maximal maneuver.","Poor effort, data can not be interpreted."} FVC: ***L FEV1: ***L, ***% predicted FEV1/FVC ratio: ***% Interpretation: {Blank single:19197::"Spirometry consistent with mild obstructive disease","Spirometry consistent with moderate obstructive disease","Spirometry consistent with severe obstructive disease","Spirometry consistent with possible restrictive disease","Spirometry consistent with mixed obstructive  and restrictive disease","Spirometry uninterpretable  due to technique","Spirometry consistent with normal pattern","No overt abnormalities noted given today's efforts"}.  Please see scanned spirometry results for details.  Skin Testing: {Blank single:19197::"Select foods","Environmental allergy panel","Environmental allergy panel and select foods","Food allergy panel","None","Deferred due to recent antihistamines use"}. Positive test to: ***. Negative test to: ***.  Results discussed with patient/family.   Medication List:  Current Outpatient Medications  Medication Sig Dispense Refill   ALBUTEROL IN Inhale 2 puffs into the lungs as needed.     atorvastatin (LIPITOR) 80 MG tablet Take 80 mg by mouth daily.     cetirizine (ZYRTEC) 10 MG tablet Take 10 mg by mouth daily.     citalopram (CELEXA) 40 MG tablet Take 40 mg by mouth daily.     empagliflozin (JARDIANCE) 25 MG TABS tablet Take 25 mg by mouth daily.     EPINEPHrine (EPIPEN 2-PAK) 0.3 mg/0.3 mL IJ SOAJ injection Inject 0.3 mLs (0.3 mg total) into the muscle as needed for anaphylaxis. 1 Device 2   fluticasone (FLONASE) 50 MCG/ACT nasal spray Place 2 sprays into both nostrils daily.     gabapentin (NEURONTIN) 600 MG tablet Take 600 mg by mouth 2 (two) times daily.      glucagon (GLUCAGEN) 1 MG SOLR injection Inject 1 mg into the vein once as needed for low blood sugar.     hydrochlorothiazide (HYDRODIURIL) 25 MG tablet Take 25 mg by mouth daily.     insulin aspart protamine- aspart (NOVOLOG MIX 70/30) (70-30) 100 UNIT/ML injection Inject 62 Units into the skin 2 (two) times daily with a meal.     Insulin Syringe-Needle U-100 (INSULIN SYRINGE .3CC/31GX5/16") 31G X 5/16" 0.3 ML MISC by Does not apply route.     ketotifen (ZADITOR) 0.025 % ophthalmic solution Place 1 drop into both eyes 2 (two) times daily.      Lancets (ONETOUCH ULTRASOFT) lancets Use as instructed 100 each 2   levothyroxine (SYNTHROID) 75 MCG tablet Take 1  tablet (75 mcg total) by mouth daily. 30 tablet 3   Lidocaine HCl (ASPERCREME LIDOCAINE) 4 % LIQD Apply 1 application topically daily as needed.     losartan (COZAAR) 25 MG tablet Take 100 mg by mouth daily.      metFORMIN (GLUCOPHAGE-XR) 500 MG 24 hr tablet Take 1,000 mg by mouth 2 (two) times daily.      montelukast (SINGULAIR) 10 MG tablet Take 10 mg by mouth at bedtime.     Olopatadine HCl (PAZEO) 0.7 % SOLN Apply 1 drop to eye daily as needed (itchy/watery eyes). 1 Bottle 3   ondansetron (ZOFRAN) 4 MG tablet Take 1 tablet (4 mg total) by mouth every 6 (six) hours as needed for nausea or vomiting. 30 tablet 1   ONE TOUCH ULTRA TEST test strip USE AS INSTRUCTED TO CHECK BLOOD SUGAR THREE TIMES DAILY. 100 each 5   pantoprazole (PROTONIX) 40 MG tablet Take 40 mg by mouth daily.     pioglitazone (ACTOS) 45 MG tablet Take 45 mg by mouth daily.     Semaglutide,0.25 or 0.5MG /DOS, (OZEMPIC, 0.25 OR 0.5 MG/DOSE,) 2 MG/1.5ML SOPN Inject into the skin.     tamsulosin (FLOMAX) 0.4 MG CAPS capsule TAKE 1 CAPSULE (0.4 MG TOTAL) BY MOUTH DAILY. 30 capsule 5   testosterone cypionate (DEPOTESTOSTERONE CYPIONATE) 200 MG/ML injection Inject 200 mg into the muscle.     Tiotropium Bromide Monohydrate (SPIRIVA RESPIMAT) 1.25 MCG/ACT AERS Inhale 2 puffs into the lungs daily. 1 Inhaler 3   urea (CARMOL) 10 % cream Apply  topically 3 (three) times daily.     No current facility-administered medications for this visit.    Allergies: Allergies  Allergen Reactions   Hydrocodone Itching   Niacin And Related Hives   I reviewed his past medical history, social history, family history, and environmental history and no significant changes have been reported from previous visit on 06/02/2019.  Review of Systems  Constitutional: Negative for appetite change, chills, fever and unexpected weight change.  HENT: Positive for congestion, rhinorrhea and sneezing.   Eyes: Positive for itching.  Respiratory:  Positive for cough, chest tightness, shortness of breath and wheezing.   Gastrointestinal: Negative for abdominal pain.  Skin: Negative for rash.  Allergic/Immunologic: Positive for environmental allergies.  Neurological: Negative for headaches.   Objective: There were no vitals taken for this visit. There is no height or weight on file to calculate BMI. Physical Exam  Constitutional: He is oriented to person, place, and time. He appears well-developed and well-nourished.  HENT:  Head: Normocephalic and atraumatic.  Right Ear: External ear normal.  Left Ear: External ear normal.  Nose: Nose normal.  Mouth/Throat: Oropharynx is clear and moist.  Eyes: Conjunctivae and EOM are normal.  Neck: Neck supple.  Cardiovascular: Normal rate, regular rhythm and normal heart sounds. Exam reveals no gallop and no friction rub.  No murmur heard. Pulmonary/Chest: Effort normal and breath sounds normal. He has no wheezes. He has no rales.  Neurological: He is alert and oriented to person, place, and time.  Skin: Skin is warm. No rash noted.  Psychiatric: He has a normal mood and affect. His behavior is normal.  Nursing note and vitals reviewed.  Previous notes and tests were reviewed. The plan was reviewed with the patient/family, and all questions/concerned were addressed.  It was my pleasure to see Meliton today and participate in his care. Please feel free to contact me with any questions or concerns.  Sincerely,  Rexene Alberts, DO Allergy & Immunology  [facility] office: [phone] [facility] office: [place] office: [phone]

## 2019-09-22 ENCOUNTER — Ambulatory Visit: Payer: PRIVATE HEALTH INSURANCE | Admitting: Allergy

## 2019-10-02 ENCOUNTER — Ambulatory Visit (INDEPENDENT_AMBULATORY_CARE_PROVIDER_SITE_OTHER): Payer: PRIVATE HEALTH INSURANCE

## 2019-10-02 DIAGNOSIS — J309 Allergic rhinitis, unspecified: Secondary | ICD-10-CM

## 2019-10-06 ENCOUNTER — Ambulatory Visit: Payer: PRIVATE HEALTH INSURANCE | Admitting: Allergy

## 2019-10-06 NOTE — Progress Notes (Deleted)
Follow Up Note  RE: [name] MRN: [id number] DOB: 22-Jan-1961 Date of Office Visit: 10/06/2019  Referring provider: [name], NP Primary care provider: [name], NP  Chief Complaint: No chief complaint on file.  History of Present Illness: I had the pleasure of seeing [name] for a follow up visit at the [facility] on 10/06/2019. He is a 58 y.o. male, who is being followed for asthma, allergic rhinoconjunctivitis. Today he is here for regular follow up visit. His previous allergy office visit was on 05/2019 with Dr. Maudie Mercury.   Moderate persistent asthma without complication Doing much better with Spiriva. No prednisone use since last visit.  Daily controller medication(s):continue Symbicort 160 2 puffs twice a day with spacer and rinse mouth afterwards. ? Continue Singulair 10mg  daily.  ? Continue Spiriva 1.28mcg respimat 2 puffs daily.  Prior to physical activity:May use albuterol rescue inhaler 2 puffs 5 to 15 minutes prior to strenuous physical activities.  Rescue medications:May use albuterol rescue inhaler 2 puffs or nebulizer every 4 to 6 hours as needed for shortness of breath, chest tightness, coughing, and wheezing. Monitor frequency of use..   Check spirometry at next visit.  Seasonal and perennial allergic rhinoconjunctivitis 07/03/2019  Loralee Pacas Barrette  Start allergy injections G-RW-T and  Mold Following schedule: B Frequency: Weekly Epi-Pen:Yes Consent signed and patient instructions given.  Past history - 2020 skin testing was positive to ragweed, mold grass, trees. Interim history - worsening symptoms when outdoors.   We mailed you the consent forms to start the allergy injections. Please sign and either mail back or drop off at the office so we can start you on the allergy shots.   Continue environmental control measures.   You can increase zyrtec 10mg  to twice a day.  Continue Singulair 10mg   daily at night.  Nasal saline spray (i.e., Simply Saline) or nasal saline lavage (i.e., NeilMed) is recommended as needed and prior to medicated nasal sprays.  Use Flonase 2 sprays daily after you do the saline wash. Demonstrated proper nasal spray use.   Take pazeo 1 drop in each eye as needed for itchy/watery eyes. Use it before mowing the lawn.  Had a detailed discussion with patient/family that clinical history is suggestive of allergic rhinitis, and may benefit from allergy immunotherapy (AIT). Discussed in detail regarding the dosing, schedule, side effects (mild to moderate local allergic reaction and rarely systemic allergic reactions including anaphylaxis), and benefits (significant improvement in nasal symptoms, seasonal flares of asthma) of immunotherapy with the patient. There is significant time commitment involved with allergy shots, which includes weekly immunotherapy injections for first 9-12 months and then biweekly to monthly injections for 3-5 years.  Start allergy immunotherapy.   Return in about 3 months (around 09/02/2019).   Assessment and Plan: [name] is a 58 y.o. male with: No problem-specific Assessment & Plan notes found for this encounter.  No follow-ups on file.  No orders of the defined types were placed in this encounter.  Lab Orders  No laboratory test(s) ordered today    Diagnostics: Spirometry:  Tracings reviewed. His effort: {Blank single:19197::"Good reproducible efforts.","It was hard to get consistent efforts and there is a question as to whether this reflects a maximal maneuver.","Poor effort, data can not be interpreted."} FVC: ***L FEV1: ***L, ***% predicted FEV1/FVC ratio: ***% Interpretation: {Blank single:19197::"Spirometry consistent with mild obstructive disease","Spirometry consistent with moderate obstructive disease","Spirometry consistent with severe obstructive disease","Spirometry consistent with possible restrictive  disease","Spirometry consistent with  mixed obstructive and restrictive disease","Spirometry uninterpretable due to technique","Spirometry consistent with normal pattern","No overt abnormalities noted given today's efforts"}.  Please see scanned spirometry results for details.  Skin Testing: {Blank single:19197::"Select foods","Environmental allergy panel","Environmental allergy panel and select foods","Food allergy panel","None","Deferred due to recent antihistamines use"}. Positive test to: ***. Negative test to: ***.  Results discussed with patient/family.   Medication List:  Current Outpatient Medications  Medication Sig Dispense Refill   ALBUTEROL IN Inhale 2 puffs into the lungs as needed.     atorvastatin (LIPITOR) 80 MG tablet Take 80 mg by mouth daily.     cetirizine (ZYRTEC) 10 MG tablet Take 10 mg by mouth daily.     citalopram (CELEXA) 40 MG tablet Take 40 mg by mouth daily.     empagliflozin (JARDIANCE) 25 MG TABS tablet Take 25 mg by mouth daily.     EPINEPHrine (EPIPEN 2-PAK) 0.3 mg/0.3 mL IJ SOAJ injection Inject 0.3 mLs (0.3 mg total) into the muscle as needed for anaphylaxis. 1 Device 2   fluticasone (FLONASE) 50 MCG/ACT nasal spray Place 2 sprays into both nostrils daily.     gabapentin (NEURONTIN) 600 MG tablet Take 600 mg by mouth 2 (two) times daily.      glucagon (GLUCAGEN) 1 MG SOLR injection Inject 1 mg into the vein once as needed for low blood sugar.     hydrochlorothiazide (HYDRODIURIL) 25 MG tablet Take 25 mg by mouth daily.     insulin aspart protamine- aspart (NOVOLOG MIX 70/30) (70-30) 100 UNIT/ML injection Inject 62 Units into the skin 2 (two) times daily with a meal.     Insulin Syringe-Needle U-100 (INSULIN SYRINGE .3CC/31GX5/16") 31G X 5/16" 0.3 ML MISC by Does not apply route.     ketotifen (ZADITOR) 0.025 % ophthalmic solution Place 1 drop into both eyes 2 (two) times daily.      Lancets (ONETOUCH ULTRASOFT) lancets Use as instructed 100 each  2   levothyroxine (SYNTHROID) 75 MCG tablet Take 1 tablet (75 mcg total) by mouth daily. 30 tablet 3   Lidocaine HCl (ASPERCREME LIDOCAINE) 4 % LIQD Apply 1 application topically daily as needed.     losartan (COZAAR) 25 MG tablet Take 100 mg by mouth daily.      metFORMIN (GLUCOPHAGE-XR) 500 MG 24 hr tablet Take 1,000 mg by mouth 2 (two) times daily.      montelukast (SINGULAIR) 10 MG tablet Take 10 mg by mouth at bedtime.     Olopatadine HCl (PAZEO) 0.7 % SOLN Apply 1 drop to eye daily as needed (itchy/watery eyes). 1 Bottle 3   ondansetron (ZOFRAN) 4 MG tablet Take 1 tablet (4 mg total) by mouth every 6 (six) hours as needed for nausea or vomiting. 30 tablet 1   ONE TOUCH ULTRA TEST test strip USE AS INSTRUCTED TO CHECK BLOOD SUGAR THREE TIMES DAILY. 100 each 5   pantoprazole (PROTONIX) 40 MG tablet Take 40 mg by mouth daily.     pioglitazone (ACTOS) 45 MG tablet Take 45 mg by mouth daily.     Semaglutide,0.25 or 0.5MG /DOS, (OZEMPIC, 0.25 OR 0.5 MG/DOSE,) 2 MG/1.5ML SOPN Inject into the skin.     tamsulosin (FLOMAX) 0.4 MG CAPS capsule TAKE 1 CAPSULE (0.4 MG TOTAL) BY MOUTH DAILY. 30 capsule 5   testosterone cypionate (DEPOTESTOSTERONE CYPIONATE) 200 MG/ML injection Inject 200 mg into the muscle.     Tiotropium Bromide Monohydrate (SPIRIVA RESPIMAT) 1.25 MCG/ACT AERS Inhale 2 puffs into the lungs daily. 1 Inhaler 3  urea (CARMOL) 10 % cream Apply topically 3 (three) times daily.     No current facility-administered medications for this visit.    Allergies: Allergies  Allergen Reactions   Hydrocodone Itching   Niacin And Related Hives   I reviewed his past medical history, social history, family history, and environmental history and no significant changes have been reported from his previous visit.  Review of Systems  Constitutional: Negative for appetite change, chills, fever and unexpected weight change.  HENT: Positive for congestion, rhinorrhea and sneezing.     Eyes: Positive for itching.  Respiratory: Positive for cough, chest tightness, shortness of breath and wheezing.   Gastrointestinal: Negative for abdominal pain.  Skin: Negative for rash.  Allergic/Immunologic: Positive for environmental allergies.  Neurological: Negative for headaches.   Objective: There were no vitals taken for this visit. There is no height or weight on file to calculate BMI. Physical Exam  Constitutional: He is oriented to person, place, and time. He appears well-developed and well-nourished.  HENT:  Head: Normocephalic and atraumatic.  Right Ear: External ear normal.  Left Ear: External ear normal.  Nose: Nose normal.  Mouth/Throat: Oropharynx is clear and moist.  Eyes: Conjunctivae and EOM are normal.  Neck: Neck supple.  Cardiovascular: Normal rate, regular rhythm and normal heart sounds. Exam reveals no gallop and no friction rub.  No murmur heard. Pulmonary/Chest: Effort normal and breath sounds normal. He has no wheezes. He has no rales.  Neurological: He is alert and oriented to person, place, and time.  Skin: Skin is warm. No rash noted.  Psychiatric: He has a normal mood and affect. His behavior is normal.  Nursing note and vitals reviewed.  Previous notes and tests were reviewed. The plan was reviewed with the patient/family, and all questions/concerned were addressed.  It was my pleasure to see [name] today and participate in his care. Please feel free to contact me with any questions or concerns.  Sincerely,  Rexene Alberts, DO Allergy & Immunology  [facility] office: [phone] [facility] office: [place] office: [phone]

## 2019-10-13 ENCOUNTER — Ambulatory Visit (INDEPENDENT_AMBULATORY_CARE_PROVIDER_SITE_OTHER): Payer: PRIVATE HEALTH INSURANCE | Admitting: Allergy

## 2019-10-13 ENCOUNTER — Other Ambulatory Visit: Payer: Self-pay

## 2019-10-13 ENCOUNTER — Ambulatory Visit: Payer: Self-pay

## 2019-10-13 ENCOUNTER — Encounter: Payer: Self-pay | Admitting: Allergy

## 2019-10-13 VITALS — BP 126/78 | HR 92 | Temp 97.6°F | Resp 20 | Ht 63.9 in | Wt 221.0 lb

## 2019-10-13 DIAGNOSIS — J4540 Moderate persistent asthma, uncomplicated: Secondary | ICD-10-CM | POA: Diagnosis not present

## 2019-10-13 DIAGNOSIS — J309 Allergic rhinitis, unspecified: Secondary | ICD-10-CM

## 2019-10-13 DIAGNOSIS — H1010 Acute atopic conjunctivitis, unspecified eye: Secondary | ICD-10-CM | POA: Diagnosis not present

## 2019-10-13 DIAGNOSIS — J302 Other seasonal allergic rhinitis: Secondary | ICD-10-CM | POA: Diagnosis not present

## 2019-10-13 DIAGNOSIS — J3089 Other allergic rhinitis: Secondary | ICD-10-CM | POA: Diagnosis not present

## 2019-10-13 NOTE — Patient Instructions (Addendum)
Moderate persistent asthma without complication  Daily controller medication(s): ? Continue Singulair 10mg  daily.  ? Continue Spiriva 1.9mcg respimat 2 puffs daily. ? If you notice issues with your breathing restart Symbicort 160 1 puff twice a day with spacer and rinse mouth afterwards. ? Make sure you start Symbicort 160 1 puff twice a day in the spring months.   Prior to physical activity:May use albuterol rescue inhaler 2 puffs 5 to 15 minutes prior to strenuous physical activities.  Rescue medications:May use albuterol rescue inhaler 2 puffs or nebulizer every 4 to 6 hours as needed for shortness of breath, chest tightness, coughing, and wheezing. Monitor frequency of use. Asthma control goals:  Full participation in all desired activities (may need albuterol before activity) Albuterol use two times or less a week on average (not counting use with activity) Cough interfering with sleep two times or less a month Oral steroids no more than once a year No hospitalizations  Seasonal and perennial allergic rhinoconjunctivitis Past history - 2020 skin testing was positive to ragweed, mold, grass, trees.  Continue environmental control measures.   Try to stop zyrtec and see if it helps with your urination.  Continue Singulair 10mg  daily at night.  Nasal saline spray (i.e., Simply Saline) or nasal saline lavage (i.e., NeilMed) is recommended as needed and prior to medicated nasal sprays.  Use Flonase 2 sprays daily after you do the saline wash.   Take pazeo 1 drop in each eye as needed for itchy/watery eyes. Use it before mowing the lawn.  Continue allergy injections weekly.  Make sure you take zyrtec on the days of your injections.   Follow up in 4 months or sooner if needed.  Get eye exam.

## 2019-10-13 NOTE — Progress Notes (Signed)
Follow Up Note  RE: [name] MRN: [id number] DOB: 1961-08-31 Date of Office Visit: 10/13/2019  Referring provider: [name], NP Primary care provider: [name], NP  Chief Complaint: Asthma (spiriva helps with asthma sx)  History of Present Illness: I had the pleasure of seeing [name] for a follow up visit at the [facility] on 10/13/2019. He is a 58 y.o. male, who is being followed for asthma, allergic rhino conjunctivitis. Today he is here for regular follow up visit. His previous allergy office visit was on 06/02/2019 with Dr. Maudie Sanchez via telemedicine.   Moderate persistent asthma without complication Doing much better with Spiriva. No prednisone use since last visit. Currently on Spiriva 1.35mcg 2 puffs once daily, Singulair 10mg  daily. Patient stopped Symbicort in August and did not notice any worsening symptoms.  Only used albuterol once in August. Denies any SOB, coughing, wheezing, chest tightness, nocturnal awakenings, ER/urgent care visits or prednisone use since the last visit.  Seasonal and perennial allergic rhinoconjunctivitis Currently on zyrtec 10mg  daily, Flonase 2 sprays, pazeo 1 drop prn with good benefit. Started allergy injection in July with no issues.  Follows with urology due to urinary issues. No recent eye exam.  Assessment and Plan: [name] is a 58 y.o. male with: Moderate persistent asthma without complication Doing much better with Spiriva. No prednisone use since last visit.  Patient stopped using Symbicort in August and did not notice any worsening symptoms.  Today spirometry was normal. . Daily controller medication(s): ? Continue Singulair 10mg  daily.  ? Continue Spiriva 1.50mcg respimat 2 puffs daily. ? If you notice issues with your breathing restart Symbicort 160 1 puff twice a day with spacer and rinse mouth afterwards. ? Make sure you start Symbicort 160 1 puff twice a day in the  spring months.   Prior to physical activity:May use albuterol rescue inhaler 2 puffs 5 to 15 minutes prior to strenuous physical activities.  Rescue medications:May use albuterol rescue inhaler 2 puffs or nebulizer every 4 to 6 hours as needed for shortness of breath, chest tightness, coughing, and wheezing. Monitor frequency of use.  Get repeat spirometry at next visit.  Discussed with patient that his asthma symptoms may have improved as there is less pollen in the air now.  Seasonal and perennial allergic rhinoconjunctivitis Past history - 2020 skin testing was positive to ragweed, mold grass, trees.  Started AIT on 07/03/2019 (G-RW-T and Mold) Interim history - improved symptoms.  Continue environmental control measures.   Try to stop zyrtec and see if it helps with your urination.  Continue Singulair 10mg  daily at night.  Nasal saline spray (i.e., Simply Saline) or nasal saline lavage (i.e., NeilMed) is recommended as needed and prior to medicated nasal sprays.  Use Flonase 2 sprays daily after you do the saline wash.   Take pazeo 1 drop in each eye as needed for itchy/watery eyes. Use it before mowing the lawn.  Recommend getting annual eye exam.  Continue allergy injections weekly.  Make sure you take zyrtec on the days of your injections.   Return in about 4 months (around 02/13/2020).  Diagnostics: Spirometry:  Tracings reviewed. His effort: Good reproducible efforts. FVC: 3.23L FEV1: 2.68L, 90% predicted FEV1/FVC ratio: 83% Interpretation: Spirometry consistent with normal pattern.  Please see scanned spirometry results for details.  Medication List:  Current Outpatient Medications  Medication Sig Dispense Refill  . ALBUTEROL IN Inhale 2 puffs into the lungs as needed.    Marland Kitchen  atorvastatin (LIPITOR) 80 MG tablet Take 80 mg by mouth daily.    . cetirizine (ZYRTEC) 10 MG tablet Take 10 mg by mouth daily.    . citalopram (CELEXA) 40 MG tablet Take 40 mg by mouth  daily.    . empagliflozin (JARDIANCE) 25 MG TABS tablet Take 25 mg by mouth daily.    Marland Kitchen EPINEPHrine (EPIPEN 2-PAK) 0.3 mg/0.3 mL IJ SOAJ injection Inject 0.3 mLs (0.3 mg total) into the muscle as needed for anaphylaxis. 1 Device 2  . fluticasone (FLONASE) 50 MCG/ACT nasal spray Place 2 sprays into both nostrils daily.    Marland Kitchen gabapentin (NEURONTIN) 600 MG tablet Take 600 mg by mouth 2 (two) times daily.     Marland Kitchen glucagon (GLUCAGEN) 1 MG SOLR injection Inject 1 mg into the vein once as needed for low blood sugar.    . hydrochlorothiazide (HYDRODIURIL) 25 MG tablet Take 25 mg by mouth daily.    . insulin aspart protamine- aspart (NOVOLOG MIX 70/30) (70-30) 100 UNIT/ML injection Inject 62 Units into the skin 2 (two) times daily with a meal.    . Insulin Syringe-Needle U-100 (INSULIN SYRINGE .3CC/31GX5/16") 31G X 5/16" 0.3 ML MISC by Does not apply route.    Marland Kitchen ketotifen (ZADITOR) 0.025 % ophthalmic solution Place 1 drop into both eyes 2 (two) times daily.     . Lancets (ONETOUCH ULTRASOFT) lancets Use as instructed 100 each 2  . levothyroxine (SYNTHROID) 75 MCG tablet Take 1 tablet (75 mcg total) by mouth daily. 30 tablet 3  . Lidocaine HCl (ASPERCREME LIDOCAINE) 4 % LIQD Apply 1 application topically daily as needed.    Marland Kitchen losartan (COZAAR) 25 MG tablet Take 100 mg by mouth daily.     . metFORMIN (GLUCOPHAGE-XR) 500 MG 24 hr tablet Take 1,000 mg by mouth 2 (two) times daily.     . montelukast (SINGULAIR) 10 MG tablet Take 10 mg by mouth at bedtime.    . NON FORMULARY Allergen immunotherapy    . Olopatadine HCl (PAZEO) 0.7 % SOLN Apply 1 drop to eye daily as needed (itchy/watery eyes). 1 Bottle 3  . ondansetron (ZOFRAN) 4 MG tablet Take 1 tablet (4 mg total) by mouth every 6 (six) hours as needed for nausea or vomiting. 30 tablet 1  . ONE TOUCH ULTRA TEST test strip USE AS INSTRUCTED TO CHECK BLOOD SUGAR THREE TIMES DAILY. 100 each 5  . pantoprazole (PROTONIX) 40 MG tablet Take 40 mg by mouth daily.    .  pioglitazone (ACTOS) 45 MG tablet Take 45 mg by mouth daily.    . Semaglutide,0.25 or 0.5MG /DOS, (OZEMPIC, 0.25 OR 0.5 MG/DOSE,) 2 MG/1.5ML SOPN Inject into the skin.    . tamsulosin (FLOMAX) 0.4 MG CAPS capsule TAKE 1 CAPSULE (0.4 MG TOTAL) BY MOUTH DAILY. 30 capsule 5  . testosterone cypionate (DEPOTESTOSTERONE CYPIONATE) 200 MG/ML injection Inject 200 mg into the muscle.    . Tiotropium Bromide Monohydrate (SPIRIVA RESPIMAT) 1.25 MCG/ACT AERS Inhale 2 puffs into the lungs daily. 1 Inhaler 3  . urea (CARMOL) 10 % cream Apply topically 3 (three) times daily.     No current facility-administered medications for this visit.    Allergies: Allergies  Allergen Reactions  . Hydrocodone Itching  . Niacin And Related Hives   I reviewed his past medical history, social history, family history, and environmental history and no significant changes have been reported from his previous visit.  Review of Systems  Constitutional: Negative for appetite change, chills, fever and unexpected  weight change.  HENT: Negative for congestion and rhinorrhea.   Eyes: Negative for itching.  Respiratory: Negative for cough, chest tightness, shortness of breath and wheezing.   Gastrointestinal: Negative for abdominal pain.  Skin: Negative for rash.  Allergic/Immunologic: Positive for environmental allergies.  Neurological: Negative for headaches.   Objective: BP 126/78 (BP Location: Left Arm, Patient Position: Sitting, Cuff Size: Large)   Pulse 92   Temp 97.6 F (36.4 C) (Temporal)   Resp 20   Ht 5' 3.9" (1.623 m)   Wt 221 lb (100.2 kg)   SpO2 96%   BMI 38.05 kg/m  Body mass index is 38.05 kg/m. Physical Exam  Constitutional: He is oriented to person, place, and time. He appears well-developed and well-nourished.  HENT:  Head: Normocephalic and atraumatic.  Right Ear: External ear normal.  Left Ear: External ear normal.  Nose: Nose normal.  Mouth/Throat: Oropharynx is clear and moist.  Eyes:  Conjunctivae and EOM are normal.  Neck: Neck supple.  Cardiovascular: Normal rate, regular rhythm and normal heart sounds. Exam reveals no gallop and no friction rub.  No murmur heard. Pulmonary/Chest: Effort normal and breath sounds normal. He has no wheezes. He has no rales.  Neurological: He is alert and oriented to person, place, and time.  Skin: Skin is warm. No rash noted.  Psychiatric: He has a normal mood and affect. His behavior is normal.  Nursing note and vitals reviewed.  Previous notes and tests were reviewed. The plan was reviewed with the patient/family, and all questions/concerned were addressed.  It was my pleasure to see [name] today and participate in his care. Please feel free to contact me with any questions or concerns.  Sincerely,  Rexene Alberts, DO Allergy & Immunology  [facility] office: [phone] [facility] office: [place] office: [phone]

## 2019-10-13 NOTE — Assessment & Plan Note (Signed)
Doing much better with Spiriva. No prednisone use since last visit.  Patient stopped using Symbicort in August and did not notice any worsening symptoms.  Today spirometry was normal. . Daily controller medication(s): ? Continue Singulair 10mg  daily.  ? Continue Spiriva 1.43mcg respimat 2 puffs daily. ? If you notice issues with your breathing restart Symbicort 160 1 puff twice a day with spacer and rinse mouth afterwards. ? Make sure you start Symbicort 160 1 puff twice a day in the spring months.   Prior to physical activity:May use albuterol rescue inhaler 2 puffs 5 to 15 minutes prior to strenuous physical activities.  Rescue medications:May use albuterol rescue inhaler 2 puffs or nebulizer every 4 to 6 hours as needed for shortness of breath, chest tightness, coughing, and wheezing. Monitor frequency of use.  Get repeat spirometry at next visit.  Discussed with patient that his asthma symptoms may have improved as there is less pollen in the air now.

## 2019-10-13 NOTE — Assessment & Plan Note (Signed)
Past history - 2020 skin testing was positive to ragweed, mold grass, trees.  Started AIT on 07/03/2019 (G-RW-T and Mold) Interim history - improved symptoms.  Continue environmental control measures.   Try to stop zyrtec and see if it helps with your urination.  Continue Singulair 10mg  daily at night.  Nasal saline spray (i.e., Simply Saline) or nasal saline lavage (i.e., NeilMed) is recommended as needed and prior to medicated nasal sprays.  Use Flonase 2 sprays daily after you do the saline wash.   Take pazeo 1 drop in each eye as needed for itchy/watery eyes. Use it before mowing the lawn.  Recommend getting annual eye exam.  Continue allergy injections weekly.  Make sure you take zyrtec on the days of your injections.

## 2019-10-20 ENCOUNTER — Ambulatory Visit: Payer: Medicare Other | Admitting: Family

## 2019-10-23 ENCOUNTER — Ambulatory Visit (INDEPENDENT_AMBULATORY_CARE_PROVIDER_SITE_OTHER): Payer: PRIVATE HEALTH INSURANCE

## 2019-10-23 DIAGNOSIS — J309 Allergic rhinitis, unspecified: Secondary | ICD-10-CM

## 2019-10-27 ENCOUNTER — Ambulatory Visit: Payer: Medicare Other | Admitting: Family

## 2019-11-02 ENCOUNTER — Ambulatory Visit (INDEPENDENT_AMBULATORY_CARE_PROVIDER_SITE_OTHER): Payer: PRIVATE HEALTH INSURANCE

## 2019-11-02 DIAGNOSIS — J309 Allergic rhinitis, unspecified: Secondary | ICD-10-CM | POA: Diagnosis not present

## 2019-11-20 ENCOUNTER — Ambulatory Visit (INDEPENDENT_AMBULATORY_CARE_PROVIDER_SITE_OTHER): Payer: Medicare Other | Admitting: Family

## 2019-11-20 ENCOUNTER — Other Ambulatory Visit: Payer: Self-pay

## 2019-11-20 ENCOUNTER — Encounter: Payer: Self-pay | Admitting: Family

## 2019-11-20 DIAGNOSIS — R5383 Other fatigue: Secondary | ICD-10-CM

## 2019-11-20 DIAGNOSIS — B349 Viral infection, unspecified: Secondary | ICD-10-CM | POA: Diagnosis not present

## 2019-11-20 DIAGNOSIS — Z20822 Contact with and (suspected) exposure to covid-19: Secondary | ICD-10-CM

## 2019-11-20 NOTE — Progress Notes (Signed)
Virtual Visit via Video Note  I connected with [name] on 11/20/19 at  7:40 AM EST by a video enabled telemedicine application and verified that I am speaking with the correct person using two identifiers.  Location: Patient: home Provider: home   I discussed the limitations of evaluation and management by telemedicine and the availability of in person appointments. The patient expressed understanding and agreed to proceed.  History of Present Illness:   Patient is a 58 year old male who presents today to discuss possible COVID-19 symptoms.  His wife, who is a Marine scientist, tested positive for COVID-19 on Saturday, 11/18/2019.  Patient reports that he developed symptoms approximately 2 days ago.  Chief symptoms include fatigue and arthralgia.  He reports associated nasal congestion which is productive of thick mucus.  He also reports associated headaches with some aching behind his eyes.  He reports associated anorexia with a 5 pound weight loss.  He denies loss of taste but he does report "a funny taste in my mouth."  He denies sore throat or fever.  He has had some chills.  Reports HR 91 and 97% oxygen saturation.  Past Medical History:  Diagnosis Date  . Arthritis   . Asthma   . Cancer of kidney ([place])    s/p L nephrectomy (renal cell carcinoma)  . Carpal tunnel syndrome   . Compression fracture of lumbar vertebra ([place])   . Depression   . Deviated septum   . Diabetes ([place])   . Fatty liver 03/08/2017  . Fibromyalgia   . Hepatitis A 1980  . History of kidney cancer    Removed left kidney   . Hypercholesteremia   . Hypertension   . Kidney disease   . Migraine   . Morbid obesity ([place]) 01/06/2017  . Obesity   . Sinus congestion   . Sleep apnea   . Thyroid disease      Social History   Socioeconomic History  . Marital status: Married    Spouse name: Not on file  . Number of children: 1  . Years of education: Not on file  . Highest education level: Not on file   Occupational History  . Occupation: Retired  Electrical engineer   Social Needs  . Financial resource strain: Not on file  . Food insecurity    Worry: Not on file    Inability: Not on file  . Transportation needs    Medical: Not on file    Non-medical: Not on file  Tobacco Use  . Smoking status: Former Smoker    Packs/day: 1.00    Years: 10.00    Pack years: 10.00    Types: Cigarettes    Quit date: 1986    Years since quitting: 34.9  . Smokeless tobacco: Never Used  Substance and Sexual Activity  . Alcohol use: Yes    Alcohol/week: 0.0 standard drinks    Comment: occasionally  . Drug use: Never  . Sexual activity: Not on file  Lifestyle  . Physical activity    Days per week: Not on file    Minutes per session: Not on file  . Stress: Not on file  Relationships  . Social Herbalist on phone: Not on file    Gets together: Not on file    Attends religious service: Not on file    Active member of club or organization: Not on file    Attends meetings of clubs or organizations: Not on file    Relationship  status: Not on file  . Intimate partner violence    Fear of current or ex partner: Not on file    Emotionally abused: Not on file    Physically abused: Not on file    Forced sexual activity: Not on file  Other Topics Concern  . 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 ANKLE SURGERY    . BACK SURGERY  2007  . COLONOSCOPY WITH ESOPHAGOGASTRODUODENOSCOPY (EGD)  [facility]   [facility]  . HAND SURGERY Right   . KNEE SURGERY Left    X2  . NASAL SEPTUM SURGERY  02/22/2019   turbinate reduction  . NEPHRECTOMY Left 2003  .  ROTATOR CUFF REPAIR Right     Family History  Problem Relation Age of Onset  . Glaucoma Maternal Grandmother   . Leukemia Maternal Grandfather   . Heart attack Brother   . Diabetes Brother   . Allergies Brother   . Diabetes Brother   . Allergies Sister   . Asthma Sister   . Heart disease Mother   . Rheumatologic disease Mother   . Lupus Mother   . Angioedema Mother   . Irritable bowel syndrome Mother   . Diverticulosis Mother   . Other Mother 34       Colon Resecction   . Heart disease Father   . Diabetes Father   . Liver disease Father   . Rheumatologic disease Sister   . Cancer Paternal Grandfather        leukemia  . Asthma Child   . Allergic rhinitis Child   . Esophageal cancer Maternal Aunt   . Eczema Neg Hx   . Urticaria Neg Hx     Allergies  Allergen Reactions  . Hydrocodone Itching  . Niacin And Related Hives    Current Outpatient Medications on File Prior to Visit  Medication Sig Dispense Refill  . ALBUTEROL IN Inhale 2 puffs into the lungs as needed.    Marland Kitchen atorvastatin (LIPITOR) 80 MG tablet Take 80 mg by mouth daily.    . cetirizine (ZYRTEC) 10 MG tablet Take 10 mg by mouth daily.    . citalopram (CELEXA) 40 MG tablet Take 40 mg by mouth daily.    . empagliflozin (JARDIANCE) 25 MG TABS tablet Take 25 mg by mouth daily.    Marland Kitchen EPINEPHrine (EPIPEN 2-PAK) 0.3 mg/0.3 mL IJ SOAJ injection Inject 0.3 mLs (0.3 mg total) into the muscle as needed for anaphylaxis. 1 Device 2  . fluticasone (FLONASE) 50 MCG/ACT nasal spray Place 2 sprays into both nostrils daily.    Marland Kitchen gabapentin (NEURONTIN) 600 MG tablet Take 600 mg by mouth 2 (two) times daily.     Marland Kitchen glucagon (GLUCAGEN) 1 MG SOLR injection Inject 1 mg into the vein once as needed for low blood sugar.    . hydrochlorothiazide (HYDRODIURIL) 25 MG tablet Take 25 mg by mouth daily.    . insulin aspart protamine- aspart (NOVOLOG MIX 70/30) (70-30) 100 UNIT/ML injection Inject 62 Units into the skin 2 (two) times daily  with a meal.    . Insulin Syringe-Needle U-100 (INSULIN  SYRINGE .3CC/31GX5/16") 31G X 5/16" 0.3 ML MISC by Does not apply route.    Marland Kitchen ketotifen (ZADITOR) 0.025 % ophthalmic solution Place 1 drop into both eyes 2 (two) times daily.     . Lancets (ONETOUCH ULTRASOFT) lancets Use as instructed 100 each 2  . levothyroxine (SYNTHROID) 75 MCG tablet Take 1 tablet (75 mcg total) by mouth daily. 30 tablet 3  . Lidocaine HCl (ASPERCREME LIDOCAINE) 4 % LIQD Apply 1 application topically daily as needed.    Marland Kitchen losartan (COZAAR) 25 MG tablet Take 100 mg by mouth daily.     . metFORMIN (GLUCOPHAGE-XR) 500 MG 24 hr tablet Take 1,000 mg by mouth 2 (two) times daily.     . montelukast (SINGULAIR) 10 MG tablet Take 10 mg by mouth at bedtime.    . NON FORMULARY Allergen immunotherapy    . Olopatadine HCl (PAZEO) 0.7 % SOLN Apply 1 drop to eye daily as needed (itchy/watery eyes). 1 Bottle 3  . ondansetron (ZOFRAN) 4 MG tablet Take 1 tablet (4 mg total) by mouth every 6 (six) hours as needed for nausea or vomiting. 30 tablet 1  . ONE TOUCH ULTRA TEST test strip USE AS INSTRUCTED TO CHECK BLOOD SUGAR THREE TIMES DAILY. 100 each 5  . pantoprazole (PROTONIX) 40 MG tablet Take 40 mg by mouth daily.    . pioglitazone (ACTOS) 45 MG tablet Take 45 mg by mouth daily.    . Semaglutide,0.25 or 0.5MG /DOS, (OZEMPIC, 0.25 OR 0.5 MG/DOSE,) 2 MG/1.5ML SOPN Inject into the skin.    . tamsulosin (FLOMAX) 0.4 MG CAPS capsule TAKE 1 CAPSULE (0.4 MG TOTAL) BY MOUTH DAILY. 30 capsule 5  . testosterone cypionate (DEPOTESTOSTERONE CYPIONATE) 200 MG/ML injection Inject 200 mg into the muscle.    . Tiotropium Bromide Monohydrate (SPIRIVA RESPIMAT) 1.25 MCG/ACT AERS Inhale 2 puffs into the lungs daily. 1 Inhaler 3  . urea (CARMOL) 10 % cream Apply topically 3 (three) times daily.     No current facility-administered medications on file prior to visit.     There were no vitals taken for this visit.    Observations/Objective:   Gen:  Awake, alert, no acute distress Resp: Breathing is even and non-labored Psych: calm/pleasant demeanor Neuro: Alert and Oriented x 3, + facial symmetry, speech is clear.   Assessment and Plan:  Viral Illness- I suspect covid-19 infection. I have suggested that the patient go to the drive up testing site the AM for testing.  We discussed importance of supportive measures including rest, hydration, and Tylenol as needed for aching and fever.  He is advised to continue to monitor his oxygen saturation.  He is advised to go to the emergency department should he develop shortness of breath or oxygen saturation of < 92%.  He is advised to quarantine pending review of his results and further recommendations. Patient verbalizes understanding.   Follow Up Instructions:    I discussed the assessment and treatment plan with the patient. The patient was provided an opportunity to ask questions and all were answered. The patient agreed with the plan and demonstrated an understanding of the instructions.   The patient was advised to call back or seek an in-person evaluation if the symptoms worsen or if the condition fails to improve as anticipated.  Nance Pear, NP

## 2019-11-21 ENCOUNTER — Telehealth: Payer: Self-pay | Admitting: *Deleted

## 2019-11-21 LAB — NOVEL CORONAVIRUS, NAA: SARS-CoV-2, NAA: NOT DETECTED

## 2019-11-21 NOTE — Telephone Encounter (Signed)
Pt wife is positive for covid and pt and his son were tested and they are negative. His last allergy injection was 11/05- he does have to quarintine for 10 days. He will have to go back to the gold vial.

## 2019-12-01 ENCOUNTER — Ambulatory Visit (INDEPENDENT_AMBULATORY_CARE_PROVIDER_SITE_OTHER): Payer: PRIVATE HEALTH INSURANCE

## 2019-12-01 DIAGNOSIS — J309 Allergic rhinitis, unspecified: Secondary | ICD-10-CM | POA: Diagnosis not present

## 2019-12-14 ENCOUNTER — Ambulatory Visit (INDEPENDENT_AMBULATORY_CARE_PROVIDER_SITE_OTHER): Payer: PRIVATE HEALTH INSURANCE

## 2019-12-14 DIAGNOSIS — J309 Allergic rhinitis, unspecified: Secondary | ICD-10-CM | POA: Diagnosis not present

## 2019-12-20 ENCOUNTER — Ambulatory Visit (INDEPENDENT_AMBULATORY_CARE_PROVIDER_SITE_OTHER): Payer: PRIVATE HEALTH INSURANCE

## 2019-12-20 DIAGNOSIS — J309 Allergic rhinitis, unspecified: Secondary | ICD-10-CM

## 2020-01-02 ENCOUNTER — Ambulatory Visit (INDEPENDENT_AMBULATORY_CARE_PROVIDER_SITE_OTHER): Payer: Medicare Other

## 2020-01-02 DIAGNOSIS — J309 Allergic rhinitis, unspecified: Secondary | ICD-10-CM

## 2020-01-11 IMAGING — DX CHEST - 2 VIEW
2 series · 2 of 2 positions shown · non-contrast
Comparison: 11/09/2018

CLINICAL DATA: Pt states that he has a productive cough and SOB. Pt
states that he had sinus surgery 2 weeks ago and has been having
drainage still continuing. Pt states that he has hx of asthma.

EXAM:
CHEST - 2 VIEW

[chest pa]
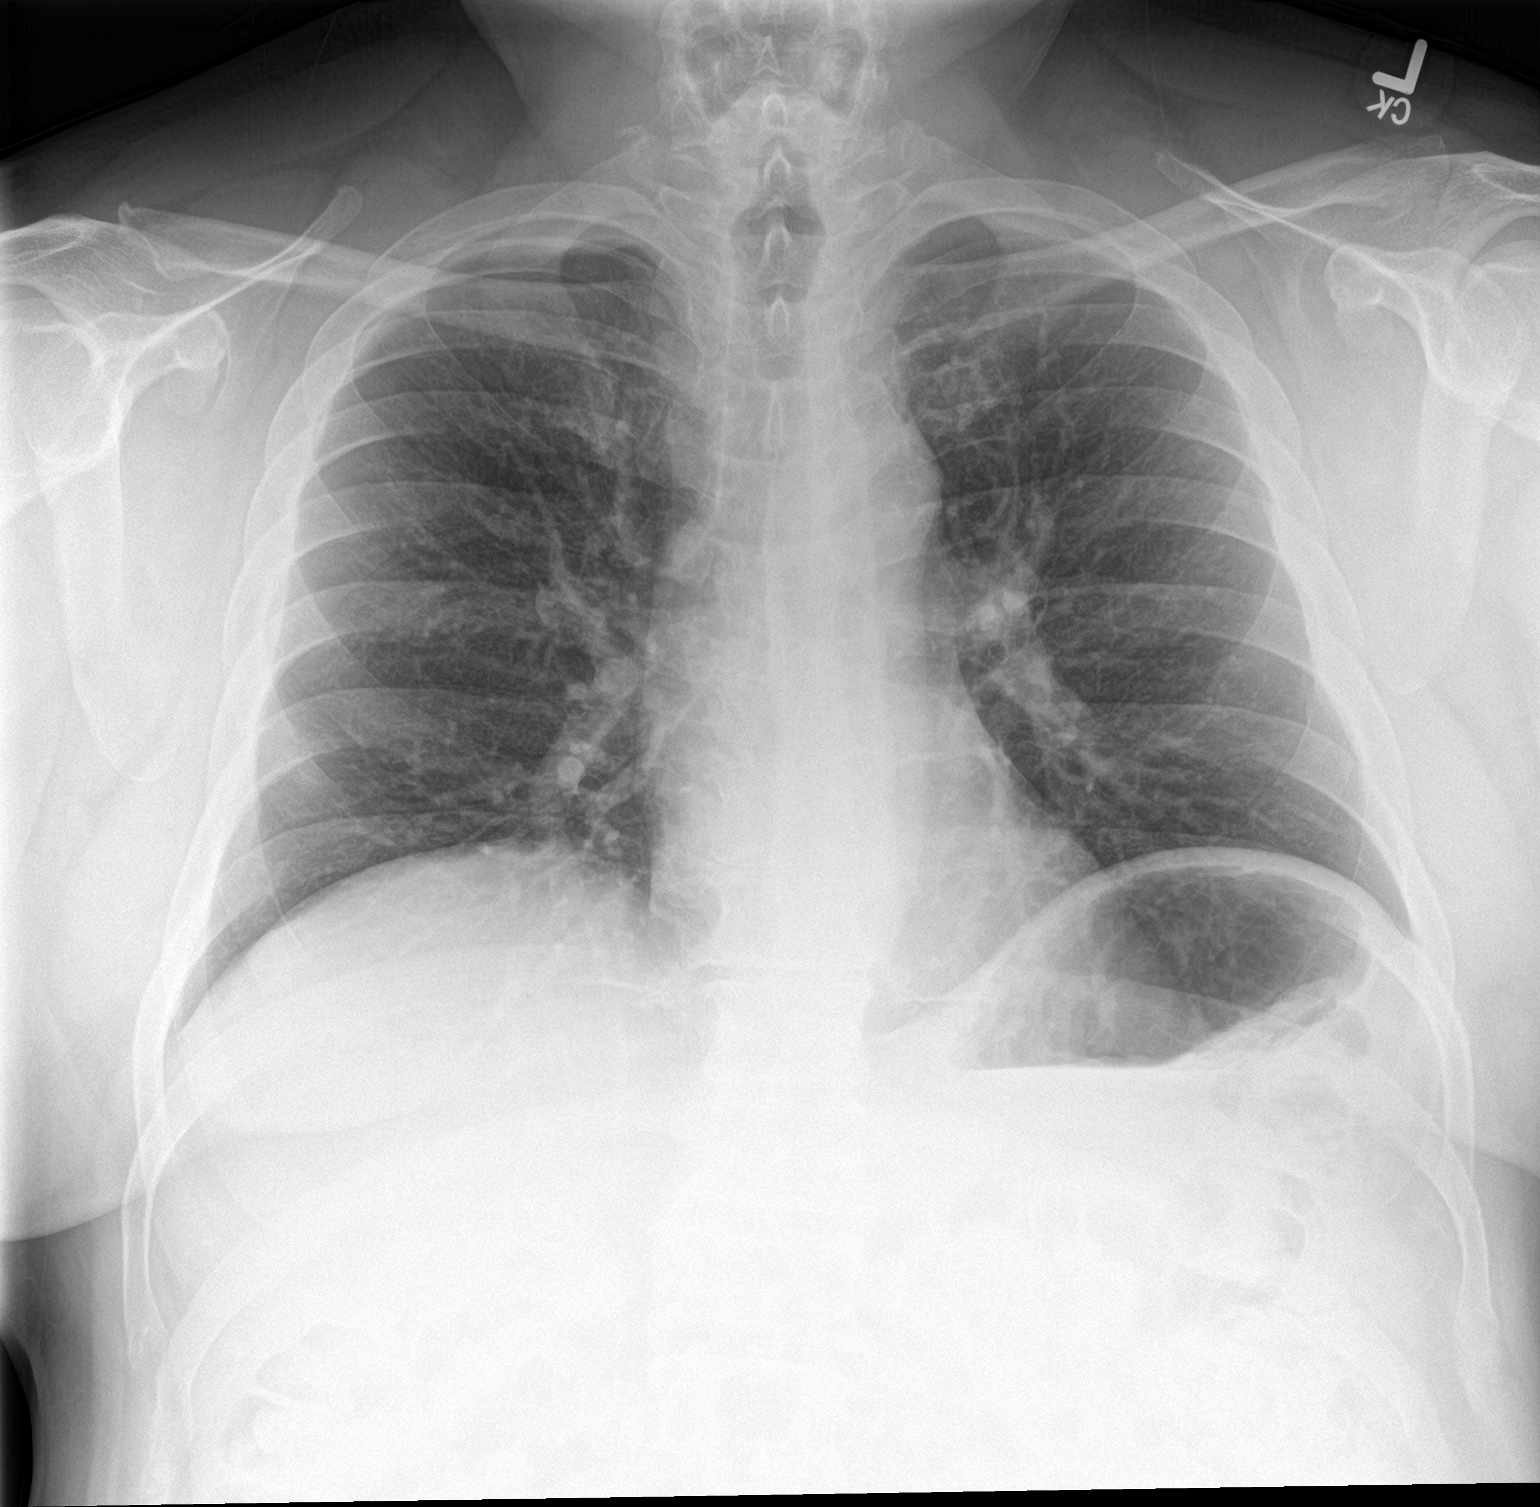

[chest lat]
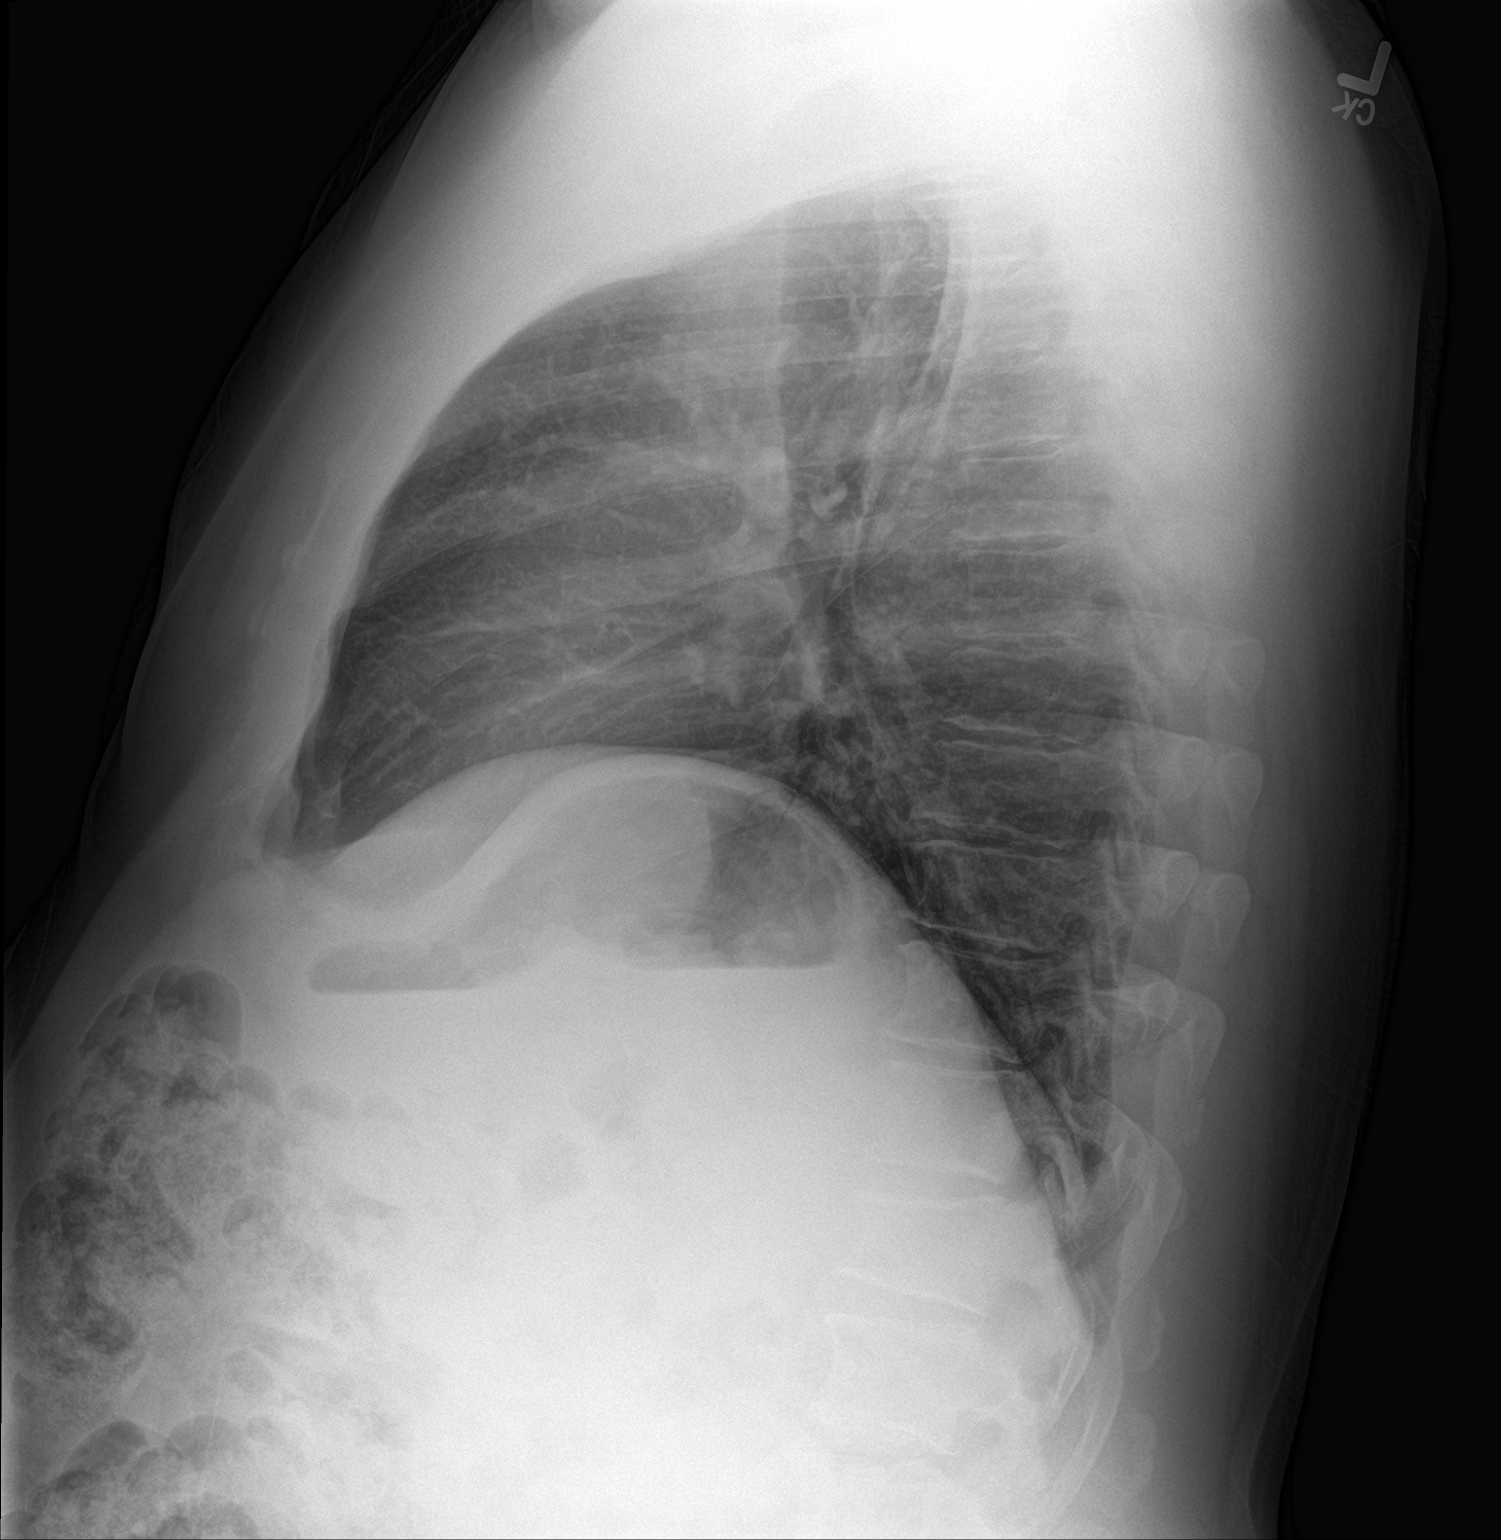

[2 of 2 positions shown; findings below may reference images not displayed]

FINDINGS: Heart size is normal. There is mild perihilar peribronchial
thickening. There are no focal consolidations or pleural effusions.
No pulmonary edema. LOWER thoracic chronic wedge compression
fracture, likely at T12. No acute osseous abnormality.
IMPRESSION: 1. Bronchitic changes.
2. No focal acute pulmonary abnormality.

## 2020-01-18 ENCOUNTER — Ambulatory Visit (INDEPENDENT_AMBULATORY_CARE_PROVIDER_SITE_OTHER): Payer: No Typology Code available for payment source

## 2020-01-18 DIAGNOSIS — J309 Allergic rhinitis, unspecified: Secondary | ICD-10-CM

## 2020-01-26 ENCOUNTER — Ambulatory Visit (INDEPENDENT_AMBULATORY_CARE_PROVIDER_SITE_OTHER): Payer: No Typology Code available for payment source

## 2020-01-26 DIAGNOSIS — J309 Allergic rhinitis, unspecified: Secondary | ICD-10-CM | POA: Diagnosis not present

## 2020-02-07 ENCOUNTER — Ambulatory Visit (INDEPENDENT_AMBULATORY_CARE_PROVIDER_SITE_OTHER): Payer: No Typology Code available for payment source

## 2020-02-07 DIAGNOSIS — J309 Allergic rhinitis, unspecified: Secondary | ICD-10-CM

## 2020-02-16 ENCOUNTER — Ambulatory Visit: Payer: PRIVATE HEALTH INSURANCE | Admitting: Allergy

## 2020-02-16 NOTE — Progress Notes (Deleted)
Follow Up Note  RE: KANE FETTERHOFF MRN: [id number] DOB: [birth year] Date of Office Visit: 02/16/2020  Referring provider: [name], NP Primary care provider: [name], NP  Chief Complaint: No chief complaint on file.  History of Present Illness: I had the pleasure of seeing [name] for a follow up visit at the [facility] [place] on 02/16/2020. He is a 59 y.o. male, who is being followed for asthma and allergic rhino conjunctivitis. His previous allergy office visit was on 10/13/2019 with Dr. Maudie Mercury. Today is a regular follow up visit.  Moderate persistent asthma without complication Doing much better with Spiriva. No prednisone use since last visit.  Patient stopped using Symbicort in August and did not notice any worsening symptoms.  Today spirometry was normal.  Daily controller medication(s): ? Continue Singulair 10mg  daily.  ? Continue Spiriva 1.11mcg respimat 2 puffs daily. ? If you notice issues with your breathing restart Symbicort 160 1 puff twice a day with spacer and rinse mouth afterwards.  Make sure you start Symbicort 160 1 puff twice a day in the spring months.   Prior to physical activity:May use albuterol rescue inhaler 2 puffs 5 to 15 minutes prior to strenuous physical activities.  Rescue medications:May use albuterol rescue inhaler 2 puffs or nebulizer every 4 to 6 hours as needed for shortness of breath, chest tightness, coughing, and wheezing. Monitor frequency of use.  Get repeat spirometry at next visit.  Discussed with patient that his asthma symptoms may have improved as there is less pollen in the air now.  Seasonal and perennial allergic rhinoconjunctivitis Past history - 2020 skin testing was positive to ragweed, mold grass, trees.  Started AIT on 07/03/2019 (G-RW-T and Mold) Interim history - improved symptoms.  Continue environmental control measures.   Try to stop zyrtec and see if it helps with your  urination.  Continue Singulair 10mg  daily at night.  Nasal saline spray (i.e., Simply Saline) or nasal saline lavage (i.e., NeilMed) is recommended as needed and prior to medicated nasal sprays.  Use Flonase 2 sprays daily after you do the saline wash.   Take pazeo 1 drop in each eye as needed for itchy/watery eyes. Use it before mowing the lawn.  Recommend getting annual eye exam.  Continue allergy injections weekly. ? Make sure you take zyrtec on the days of your injections.   Return in about 4 months (around 02/13/2020).  Assessment and Plan: [name] is a 59 y.o. male with: No problem-specific Assessment & Plan notes found for this encounter.  No follow-ups on file.  No orders of the defined types were placed in this encounter.  Lab Orders  No laboratory test(s) ordered today    Diagnostics: Spirometry:  Tracings reviewed. His effort: {Blank single:19197::"Good reproducible efforts.","It was hard to get consistent efforts and there is a question as to whether this reflects a maximal maneuver.","Poor effort, data can not be interpreted."} FVC: ***L FEV1: ***L, ***% predicted FEV1/FVC ratio: ***% Interpretation: {Blank single:19197::"Spirometry consistent with mild obstructive disease","Spirometry consistent with moderate obstructive disease","Spirometry consistent with severe obstructive disease","Spirometry consistent with possible restrictive disease","Spirometry consistent with mixed obstructive and restrictive disease","Spirometry uninterpretable due to technique","Spirometry consistent with normal pattern","No overt abnormalities noted given today's efforts"}.  Please see scanned spirometry results for details.  Skin Testing: {Blank single:19197::"Select foods","Environmental allergy panel","Environmental allergy panel and select foods","Food allergy panel","None","Deferred due to recent antihistamines use"}. Positive test to: ***. Negative test to: ***.  Results  discussed with patient/family.  Medication List:  Current Outpatient Medications  Medication Sig Dispense Refill  . ALBUTEROL IN Inhale 2 puffs into the lungs as needed.    Marland Kitchen atorvastatin (LIPITOR) 80 MG tablet Take 80 mg by mouth daily.    . cetirizine (ZYRTEC) 10 MG tablet Take 10 mg by mouth daily.    . citalopram (CELEXA) 40 MG tablet Take 40 mg by mouth daily.    . empagliflozin (JARDIANCE) 25 MG TABS tablet Take 25 mg by mouth daily.    Marland Kitchen EPINEPHrine (EPIPEN 2-PAK) 0.3 mg/0.3 mL IJ SOAJ injection Inject 0.3 mLs (0.3 mg total) into the muscle as needed for anaphylaxis. 1 Device 2  . fluticasone (FLONASE) 50 MCG/ACT nasal spray Place 2 sprays into both nostrils daily.    Marland Kitchen gabapentin (NEURONTIN) 600 MG tablet Take 600 mg by mouth 2 (two) times daily.     Marland Kitchen glucagon (GLUCAGEN) 1 MG SOLR injection Inject 1 mg into the vein once as needed for low blood sugar.    . hydrochlorothiazide (HYDRODIURIL) 25 MG tablet Take 25 mg by mouth daily.    . insulin aspart protamine- aspart (NOVOLOG MIX 70/30) (70-30) 100 UNIT/ML injection Inject 62 Units into the skin 2 (two) times daily with a meal.    . Insulin Syringe-Needle U-100 (INSULIN SYRINGE .3CC/31GX5/16") 31G X 5/16" 0.3 ML MISC by Does not apply route.    Marland Kitchen ketotifen (ZADITOR) 0.025 % ophthalmic solution Place 1 drop into both eyes 2 (two) times daily.     . Lancets (ONETOUCH ULTRASOFT) lancets Use as instructed 100 each 2  . levothyroxine (SYNTHROID) 75 MCG tablet Take 1 tablet (75 mcg total) by mouth daily. 30 tablet 3  . Lidocaine HCl (ASPERCREME LIDOCAINE) 4 % LIQD Apply 1 application topically daily as needed.    Marland Kitchen losartan (COZAAR) 25 MG tablet Take 100 mg by mouth daily.     . metFORMIN (GLUCOPHAGE-XR) 500 MG 24 hr tablet Take 1,000 mg by mouth 2 (two) times daily.     . montelukast (SINGULAIR) 10 MG tablet Take 10 mg by mouth at bedtime.    . NON FORMULARY Allergen immunotherapy    . Olopatadine HCl (PAZEO) 0.7 % SOLN Apply 1 drop to  eye daily as needed (itchy/watery eyes). 1 Bottle 3  . ondansetron (ZOFRAN) 4 MG tablet Take 1 tablet (4 mg total) by mouth every 6 (six) hours as needed for nausea or vomiting. 30 tablet 1  . ONE TOUCH ULTRA TEST test strip USE AS INSTRUCTED TO CHECK BLOOD SUGAR THREE TIMES DAILY. 100 each 5  . pantoprazole (PROTONIX) 40 MG tablet Take 40 mg by mouth daily.    . pioglitazone (ACTOS) 45 MG tablet Take 45 mg by mouth daily.    . Semaglutide,0.25 or 0.5MG /DOS, (OZEMPIC, 0.25 OR 0.5 MG/DOSE,) 2 MG/1.5ML SOPN Inject into the skin.    . tamsulosin (FLOMAX) 0.4 MG CAPS capsule TAKE 1 CAPSULE (0.4 MG TOTAL) BY MOUTH DAILY. 30 capsule 5  . testosterone cypionate (DEPOTESTOSTERONE CYPIONATE) 200 MG/ML injection Inject 200 mg into the muscle.    . Tiotropium Bromide Monohydrate (SPIRIVA RESPIMAT) 1.25 MCG/ACT AERS Inhale 2 puffs into the lungs daily. 1 Inhaler 3  . urea (CARMOL) 10 % cream Apply topically 3 (three) times daily.     No current facility-administered medications for this visit.   Allergies: Allergies  Allergen Reactions  . Hydrocodone Itching  . Niacin And Related Hives   I reviewed his past medical history, social history, family history, and environmental history  and no significant changes have been reported from his previous visit.  Review of Systems  Constitutional: Negative for appetite change, chills, fever and unexpected weight change.  HENT: Negative for congestion and rhinorrhea.   Eyes: Negative for itching.  Respiratory: Negative for cough, chest tightness, shortness of breath and wheezing.   Gastrointestinal: Negative for abdominal pain.  Skin: Negative for rash.  Allergic/Immunologic: Positive for environmental allergies.  Neurological: Negative for headaches.   Objective: There were no vitals taken for this visit. There is no height or weight on file to calculate BMI. Physical Exam  Constitutional: He is oriented to person, place, and time. He appears  well-developed and well-nourished.  HENT:  Head: Normocephalic and atraumatic.  Right Ear: External ear normal.  Left Ear: External ear normal.  Nose: Nose normal.  Mouth/Throat: Oropharynx is clear and moist.  Eyes: Conjunctivae and EOM are normal.  Cardiovascular: Normal rate, regular rhythm and normal heart sounds. Exam reveals no gallop and no friction rub.  No murmur heard. Pulmonary/Chest: Effort normal and breath sounds normal. He has no wheezes. He has no rales.  Musculoskeletal:     Cervical back: Neck supple.  Neurological: He is alert and oriented to person, place, and time.  Skin: Skin is warm. No rash noted.  Psychiatric: He has a normal mood and affect. His behavior is normal.  Nursing note and vitals reviewed.  Previous notes and tests were reviewed. The plan was reviewed with the patient/family, and all questions/concerned were addressed.  It was my pleasure to see Buford today and participate in his care. Please feel free to contact me with any questions or concerns.  Sincerely,  Rexene Alberts, DO Allergy & Immunology  [facility] office: [phone] [facility] office: [place] office: [phone]

## 2020-02-28 ENCOUNTER — Ambulatory Visit (INDEPENDENT_AMBULATORY_CARE_PROVIDER_SITE_OTHER): Payer: Medicare Other | Admitting: *Deleted

## 2020-02-28 DIAGNOSIS — J309 Allergic rhinitis, unspecified: Secondary | ICD-10-CM | POA: Diagnosis not present

## 2020-02-29 NOTE — Progress Notes (Signed)
Follow Up Note  RE: JEANPHILIPPE KYLE MRN: [id number] DOB: [birth year] Date of Office Visit: 03/01/2020  Referring provider: [name], NP Primary care provider: [name], NP  Chief Complaint: Asthma (some dyspnea with cleaning up tree debris.), Allergic Rhinitis  (eyes itcy with trees blooming), and Medication Management (discuss restarting Symbicort)  History of Present Illness: I had the pleasure of seeing Temidayo Lina for a follow up visit at the [facility] [place] on 03/01/2020. He is a 59 y.o. male, who is being followed for asthma and allergic rhinoconjunctivitis. His previous allergy office visit was on 10/13/2019 with Dr. Maudie Mercury. Today is a regular follow up visit.  Moderate persistent asthma Patient has been off Symbicort since the last visit and was doing well but recently since he has been doing more work outdoors noticing some shortness of breath.  Currently on Singulair and Spiriva 2 puffs daily. Used albuterol 3 times in the past month.  Denies any coughing, wheezing, chest tightness, nocturnal awakenings, ER/urgent care visits or prednisone use since the last visit.  Seasonal and perennial allergic rhinoconjunctivitis Has some increased itchy eyes lately.  Currently using Pazeo once a day with some benefit.  Currently on Flonase 2 sprays daily after saline rinse with minimal benefit. Complains mainly of nasal congestion.  Patient did not stop zyrtec. His urination is slightly better with Flomax.   Doing well with allergy injections.   Broke out in rash after coming in contact with some pollen.  Assessment and Plan: Esteven is a 59 y.o. male with: Moderate persistent asthma without complication Was doing well until the weather has been getting warmer and has been spending more time outdoors. Using albuterol sometimes with good benefit.   Today's spirometry was normal.   Daily controller medication(s): ? Restart Symbicort 160 1  puff twice a day with spacer and rinse mouth afterwards. ? We will continue this until your next visit.  ? Continue Singulair 10mg  daily.  ? Continue Spiriva 1.34mcg respimat 2 puffs daily.  Prior to physical activity:May use albuterol rescue inhaler 2 puffs 5 to 15 minutes prior to strenuous physical activities.  Rescue medications:May use albuterol rescue inhaler 2 puffs or nebulizer every 4 to 6 hours as needed for shortness of breath, chest tightness, coughing, and wheezing. Monitor frequency of use.  Seasonal and perennial allergic rhinoconjunctivitis Past history - 2020 skin testing was positive to ragweed, mold grass, trees.  Started AIT on 07/03/2019 (G-RW-T and Mold). Interim history - some increased symptoms with the warmer weather.   Stop Flonase.  Start Xhance 2 sprays per nostril twice a day. Sample given and demonstrated proper use.   Nasal saline spray (i.e., Simply Saline) or nasal saline lavage (i.e., NeilMed) is recommended as needed and prior to medicated nasal sprays.  Continue environmental control measures.   Continue zyrtec (cetirizine) 10mg  daily and may take it twice a day if needed for allergy flares.   Continue Singulair 10mg  daily at night.  May use Pazeo 1 drop in each eye as needed for itchy/watery eyes. Use it before mowing the lawn.  Recommend getting annual eye exam.  Continue allergy injections.  Return in about 4 months (around 07/01/2020).  Meds ordered this encounter  Medications  . Fluticasone Propionate (XHANCE) 93 MCG/ACT EXHU    Sig: Place 2 sprays into the nose in the morning and at bedtime.    Dispense:  32 mL    Refill:  11   Diagnostics: Spirometry:  Tracings reviewed.  His effort: Good reproducible efforts. FVC: 3.12L FEV1: 2.48L, 88% predicted FEV1/FVC ratio: 79% Interpretation: Spirometry consistent with normal pattern.  Please see scanned spirometry results for details.  Medication List:  Current Outpatient Medications    Medication Sig Dispense Refill  . ALBUTEROL IN Inhale 2 puffs into the lungs as needed.    Marland Kitchen atorvastatin (LIPITOR) 80 MG tablet Take 80 mg by mouth daily.    . cetirizine (ZYRTEC) 10 MG tablet Take 10 mg by mouth daily.    . citalopram (CELEXA) 40 MG tablet Take 40 mg by mouth daily.    . empagliflozin (JARDIANCE) 25 MG TABS tablet Take 25 mg by mouth daily.    Marland Kitchen EPINEPHrine (EPIPEN 2-PAK) 0.3 mg/0.3 mL IJ SOAJ injection Inject 0.3 mLs (0.3 mg total) into the muscle as needed for anaphylaxis. 1 Device 2  . gabapentin (NEURONTIN) 600 MG tablet Take 600 mg by mouth 2 (two) times daily.     Marland Kitchen glucagon (GLUCAGEN) 1 MG SOLR injection Inject 1 mg into the vein once as needed for low blood sugar.    . hydrochlorothiazide (HYDRODIURIL) 25 MG tablet Take 25 mg by mouth daily.    . insulin aspart protamine- aspart (NOVOLOG MIX 70/30) (70-30) 100 UNIT/ML injection Inject 62 Units into the skin 2 (two) times daily with a meal.    . Insulin Syringe-Needle U-100 (INSULIN SYRINGE .3CC/31GX5/16") 31G X 5/16" 0.3 ML MISC by Does not apply route.    Marland Kitchen ketotifen (ZADITOR) 0.025 % ophthalmic solution Place 1 drop into both eyes 2 (two) times daily.     . Lancets (ONETOUCH ULTRASOFT) lancets Use as instructed 100 each 2  . levothyroxine (SYNTHROID) 75 MCG tablet Take 1 tablet (75 mcg total) by mouth daily. 30 tablet 3  . Lidocaine HCl (ASPERCREME LIDOCAINE) 4 % LIQD Apply 1 application topically daily as needed.    Marland Kitchen losartan (COZAAR) 25 MG tablet Take 100 mg by mouth daily.     . metFORMIN (GLUCOPHAGE-XR) 500 MG 24 hr tablet Take 1,000 mg by mouth 2 (two) times daily.     . montelukast (SINGULAIR) 10 MG tablet Take 10 mg by mouth at bedtime.    . NON FORMULARY Allergen immunotherapy    . ondansetron (ZOFRAN) 4 MG tablet Take 1 tablet (4 mg total) by mouth every 6 (six) hours as needed for nausea or vomiting. 30 tablet 1  . ONE TOUCH ULTRA TEST test strip USE AS INSTRUCTED TO CHECK BLOOD SUGAR THREE TIMES DAILY.  100 each 5  . pantoprazole (PROTONIX) 40 MG tablet Take 40 mg by mouth daily.    . pioglitazone (ACTOS) 45 MG tablet Take 45 mg by mouth daily.    . Semaglutide,0.25 or 0.5MG /DOS, (OZEMPIC, 0.25 OR 0.5 MG/DOSE,) 2 MG/1.5ML SOPN Inject into the skin.    . tamsulosin (FLOMAX) 0.4 MG CAPS capsule TAKE 1 CAPSULE (0.4 MG TOTAL) BY MOUTH DAILY. 30 capsule 5  . testosterone cypionate (DEPOTESTOSTERONE CYPIONATE) 200 MG/ML injection Inject 200 mg into the muscle.    . Tiotropium Bromide Monohydrate (SPIRIVA RESPIMAT) 1.25 MCG/ACT AERS Inhale 2 puffs into the lungs daily. 1 Inhaler 3  . urea (CARMOL) 10 % cream Apply topically 3 (three) times daily.    . Fluticasone Propionate (XHANCE) 93 MCG/ACT EXHU Place 2 sprays into the nose in the morning and at bedtime. 32 mL 11  . Olopatadine HCl (PAZEO) 0.7 % SOLN Apply 1 drop to eye daily as needed (itchy/watery eyes). (Patient not taking: Reported on 03/01/2020) 1  Bottle 3   No current facility-administered medications for this visit.   Allergies: Allergies  Allergen Reactions  . Hydrocodone Itching  . Niacin And Related Hives   I reviewed his past medical history, social history, family history, and environmental history and no significant changes have been reported from his previous visit.  Review of Systems  Constitutional: Negative for appetite change, chills, fever and unexpected weight change.  HENT: Positive for congestion. Negative for rhinorrhea.   Eyes: Positive for itching.  Respiratory: Negative for cough, chest tightness, shortness of breath and wheezing.   Gastrointestinal: Negative for abdominal pain.  Skin: Negative for rash.  Allergic/Immunologic: Positive for environmental allergies.  Neurological: Negative for headaches.   Objective: BP 128/72 (BP Location: Right Arm, Patient Position: Sitting, Cuff Size: Large)   Pulse 82   Temp 97.8 F (36.6 C) (Tympanic)   Resp 16   Ht 5' 3.9" (1.623 m)   Wt 225 lb (102.1 kg)   SpO2 96%    BMI 38.74 kg/m  Body mass index is 38.74 kg/m. Physical Exam  Constitutional: He is oriented to person, place, and time. He appears well-developed and well-nourished.  HENT:  Head: Normocephalic and atraumatic.  Right Ear: External ear normal.  Left Ear: External ear normal.  Mouth/Throat: Oropharynx is clear and moist.  Some nasal dicharge  Eyes: Conjunctivae and EOM are normal.  Cardiovascular: Normal rate, regular rhythm and normal heart sounds. Exam reveals no gallop and no friction rub.  No murmur heard. Pulmonary/Chest: Effort normal and breath sounds normal. He has no wheezes. He has no rales.  Musculoskeletal:     Cervical back: Neck supple.  Neurological: He is alert and oriented to person, place, and time.  Skin: Skin is warm. No rash noted.  Psychiatric: He has a normal mood and affect. His behavior is normal.  Nursing note and vitals reviewed.  Previous notes and tests were reviewed. The plan was reviewed with the patient/family, and all questions/concerned were addressed.  It was my pleasure to see [name] today and participate in his care. Please feel free to contact me with any questions or concerns.  Sincerely,  Rexene Alberts, DO Allergy & Immunology  [facility] office: [phone] [facility] office: [place] office: [phone]

## 2020-03-01 ENCOUNTER — Ambulatory Visit (INDEPENDENT_AMBULATORY_CARE_PROVIDER_SITE_OTHER): Payer: No Typology Code available for payment source | Admitting: Allergy

## 2020-03-01 ENCOUNTER — Other Ambulatory Visit: Payer: Self-pay

## 2020-03-01 ENCOUNTER — Encounter: Payer: Self-pay | Admitting: Allergy

## 2020-03-01 VITALS — BP 128/72 | HR 82 | Temp 97.8°F | Resp 16 | Ht 63.9 in | Wt 225.0 lb

## 2020-03-01 DIAGNOSIS — H1010 Acute atopic conjunctivitis, unspecified eye: Secondary | ICD-10-CM | POA: Diagnosis not present

## 2020-03-01 DIAGNOSIS — J3089 Other allergic rhinitis: Secondary | ICD-10-CM | POA: Diagnosis not present

## 2020-03-01 DIAGNOSIS — J302 Other seasonal allergic rhinitis: Secondary | ICD-10-CM | POA: Diagnosis not present

## 2020-03-01 DIAGNOSIS — J4540 Moderate persistent asthma, uncomplicated: Secondary | ICD-10-CM | POA: Diagnosis not present

## 2020-03-01 MED ORDER — XHANCE 93 MCG/ACT NA EXHU: 2.0000 | INHALANT_SUSPENSION | Freq: Two times a day (BID) | NASAL | 11 refills | Status: DC

## 2020-03-01 NOTE — Assessment & Plan Note (Addendum)
Past history - 2020 skin testing was positive to ragweed, mold grass, trees.  Started AIT on 07/03/2019 (G-RW-T and Mold). Interim history - some increased symptoms with the warmer weather.   Stop Flonase.  Start Xhance 2 sprays per nostril twice a day. Sample given and demonstrated proper use.   Nasal saline spray (i.e., Simply Saline) or nasal saline lavage (i.e., NeilMed) is recommended as needed and prior to medicated nasal sprays.  Continue environmental control measures.   Continue zyrtec (cetirizine) 10mg  daily and may take it twice a day if needed for allergy flares.   Continue Singulair 10mg  daily at night.  May use Pazeo 1 drop in each eye as needed for itchy/watery eyes. Use it before mowing the lawn.  Recommend getting annual eye exam.  Continue allergy injections.

## 2020-03-01 NOTE — Assessment & Plan Note (Addendum)
Was doing well until the weather has been getting warmer and has been spending more time outdoors. Using albuterol sometimes with good benefit.   Today's spirometry was normal.   Daily controller medication(s): ? Restart Symbicort 160 1 puff twice a day with spacer and rinse mouth afterwards. ? We will continue this until your next visit.  ? Continue Singulair 10mg  daily.  ? Continue Spiriva 1.62mcg respimat 2 puffs daily.  Prior to physical activity:May use albuterol rescue inhaler 2 puffs 5 to 15 minutes prior to strenuous physical activities.  Rescue medications:May use albuterol rescue inhaler 2 puffs or nebulizer every 4 to 6 hours as needed for shortness of breath, chest tightness, coughing, and wheezing. Monitor frequency of use.

## 2020-03-01 NOTE — Patient Instructions (Addendum)
Moderate persistent asthma   Daily controller medication(s): ? Restart Symbicort 160 1 puff twice a day with spacer and rinse mouth afterwards. ? We will continue this until your next visit.  ? Continue Singulair 10mg  daily.  ? Continue Spiriva 1.39mcg respimat 2 puffs daily.  Prior to physical activity:May use albuterol rescue inhaler 2 puffs 5 to 15 minutes prior to strenuous physical activities.  Rescue medications:May use albuterol rescue inhaler 2 puffs or nebulizer every 4 to 6 hours as needed for shortness of breath, chest tightness, coughing, and wheezing. Monitor frequency of use. Asthma control goals:  Full participation in all desired activities (may need albuterol before activity) Albuterol use two times or less a week on average (not counting use with activity) Cough interfering with sleep two times or less a month Oral steroids no more than once a year No hospitalizations  Seasonal and perennial allergic rhinoconjunctivitis 2020 skin testing was positive to ragweed, mold grass, trees.    Stop Flonase.  Start Xhance 2 sprays per nostril twice a day. Sample given and demonstrated proper use.   Nasal saline spray (i.e., Simply Saline) or nasal saline lavage (i.e., NeilMed) is recommended as needed and prior to medicated nasal sprays.  Continue environmental control measures.   Continue zyrtec (cetirizine) 10mg  daily and may take it twice a day if needed for allergy flares.   Continue Singulair 10mg  daily at night.  May use pazeo 1 drop in each eye as needed for itchy/watery eyes. Use it before mowing the lawn.  Recommend getting annual eye exam.  Continue allergy injections.  Follow up in 4 months or sooner if needed.

## 2020-03-04 ENCOUNTER — Telehealth: Payer: Self-pay

## 2020-03-04 NOTE — Telephone Encounter (Signed)
PA request for [facility] received.  Key: [id number]

## 2020-03-04 NOTE — Telephone Encounter (Signed)
PA submitted on covermymeds

## 2020-03-05 NOTE — Telephone Encounter (Signed)
Prior authorization for Xhance through medicare was denied due to not being used for FDA approved medical reason. Do you know if patient has a history of nasal polyps/polyposis?

## 2020-03-05 NOTE — Telephone Encounter (Signed)
I am submitting the medicare form again via fax. Will update with approval/denial.

## 2020-03-06 NOTE — Telephone Encounter (Signed)
Signed form

## 2020-03-12 NOTE — Telephone Encounter (Signed)
Appeal letter and supporting documentation received from [employer] to aid in the appeal process. The letter has been signed by Dr. Maudie Mercury and faxed to the patient's part d appeal department. I will await return notification.

## 2020-03-13 ENCOUNTER — Ambulatory Visit (INDEPENDENT_AMBULATORY_CARE_PROVIDER_SITE_OTHER): Payer: No Typology Code available for payment source

## 2020-03-13 DIAGNOSIS — J309 Allergic rhinitis, unspecified: Secondary | ICD-10-CM

## 2020-03-15 NOTE — Telephone Encounter (Signed)
Appeal is still pending.

## 2020-03-19 NOTE — Telephone Encounter (Signed)
Received a letter form [employer] stating Truett Perna was  approved through insurance. Authorization Number [id number] valid March 08,2021 through December 27, 2020

## 2020-03-25 ENCOUNTER — Ambulatory Visit (INDEPENDENT_AMBULATORY_CARE_PROVIDER_SITE_OTHER): Payer: No Typology Code available for payment source

## 2020-03-25 DIAGNOSIS — J309 Allergic rhinitis, unspecified: Secondary | ICD-10-CM

## 2020-04-02 ENCOUNTER — Ambulatory Visit (INDEPENDENT_AMBULATORY_CARE_PROVIDER_SITE_OTHER): Payer: No Typology Code available for payment source

## 2020-04-02 DIAGNOSIS — J309 Allergic rhinitis, unspecified: Secondary | ICD-10-CM | POA: Diagnosis not present

## 2020-04-05 ENCOUNTER — Other Ambulatory Visit: Payer: Self-pay

## 2020-04-05 ENCOUNTER — Emergency Department (HOSPITAL_BASED_OUTPATIENT_CLINIC_OR_DEPARTMENT_OTHER): Payer: No Typology Code available for payment source

## 2020-04-05 ENCOUNTER — Encounter (HOSPITAL_BASED_OUTPATIENT_CLINIC_OR_DEPARTMENT_OTHER): Payer: Self-pay

## 2020-04-05 ENCOUNTER — Emergency Department (HOSPITAL_BASED_OUTPATIENT_CLINIC_OR_DEPARTMENT_OTHER)
Admission: EM | Admit: 2020-04-05 | Discharge: 2020-04-05 | Disposition: A | Discharge status: Home / Self Care | Payer: No Typology Code available for payment source | Attending: Emergency Medicine | Admitting: Emergency Medicine

## 2020-04-05 DIAGNOSIS — R1110 Vomiting, unspecified: Secondary | ICD-10-CM | POA: Insufficient documentation

## 2020-04-05 DIAGNOSIS — N189 Chronic kidney disease, unspecified: Secondary | ICD-10-CM | POA: Diagnosis not present

## 2020-04-05 DIAGNOSIS — R1011 Right upper quadrant pain: Secondary | ICD-10-CM | POA: Diagnosis not present

## 2020-04-05 DIAGNOSIS — I129 Hypertensive chronic kidney disease with stage 1 through stage 4 chronic kidney disease, or unspecified chronic kidney disease: Secondary | ICD-10-CM | POA: Diagnosis not present

## 2020-04-05 DIAGNOSIS — Z79899 Other long term (current) drug therapy: Secondary | ICD-10-CM | POA: Insufficient documentation

## 2020-04-05 DIAGNOSIS — Z794 Long term (current) use of insulin: Secondary | ICD-10-CM | POA: Insufficient documentation

## 2020-04-05 DIAGNOSIS — Z85528 Personal history of other malignant neoplasm of kidney: Secondary | ICD-10-CM | POA: Insufficient documentation

## 2020-04-05 DIAGNOSIS — Z87891 Personal history of nicotine dependence: Secondary | ICD-10-CM | POA: Diagnosis not present

## 2020-04-05 DIAGNOSIS — E1122 Type 2 diabetes mellitus with diabetic chronic kidney disease: Secondary | ICD-10-CM | POA: Insufficient documentation

## 2020-04-05 DIAGNOSIS — R109 Unspecified abdominal pain: Secondary | ICD-10-CM | POA: Diagnosis present

## 2020-04-05 LAB — CBC WITH DIFFERENTIAL/PLATELET
Abs Immature Granulocytes: 0.03 10*3/uL (ref 0.00–0.07)
Basophils Absolute: 0 10*3/uL (ref 0.0–0.1)
Basophils Relative: 0 %
Eosinophils Absolute: 0.3 10*3/uL (ref 0.0–0.5)
Eosinophils Relative: 3 %
HCT: 49 % (ref 39.0–52.0)
Hemoglobin: 16.7 g/dL (ref 13.0–17.0)
Immature Granulocytes: 0 %
Lymphocytes Relative: 16 %
Lymphs Abs: 1.6 10*3/uL (ref 0.7–4.0)
MCH: 30.1 pg (ref 26.0–34.0)
MCHC: 34.1 g/dL (ref 30.0–36.0)
MCV: 88.4 fL (ref 80.0–100.0)
Monocytes Absolute: 1.2 10*3/uL — ABNORMAL HIGH (ref 0.1–1.0)
Monocytes Relative: 12 %
Neutro Abs: 7 10*3/uL (ref 1.7–7.7)
Neutrophils Relative %: 69 %
Platelets: 243 10*3/uL (ref 150–400)
RBC: 5.54 MIL/uL (ref 4.22–5.81)
RDW: 13.4 % (ref 11.5–15.5)
WBC: 10.2 10*3/uL (ref 4.0–10.5)
nRBC: 0 % (ref 0.0–0.2)

## 2020-04-05 LAB — URINALYSIS, MICROSCOPIC (REFLEX): RBC / HPF: NONE SEEN RBC/hpf (ref 0–5)

## 2020-04-05 LAB — BASIC METABOLIC PANEL
Anion gap: 11 (ref 5–15)
BUN: 21 mg/dL — ABNORMAL HIGH (ref 6–20)
CO2: 24 mmol/L (ref 22–32)
Calcium: 8.6 mg/dL — ABNORMAL LOW (ref 8.9–10.3)
Chloride: 102 mmol/L (ref 98–111)
Creatinine, Ser: 1.23 mg/dL (ref 0.61–1.24)
GFR calc Af Amer: >60 mL/min (ref >60)
GFR calc non Af Amer: >60 mL/min (ref >60)
Glucose, Bld: 112 mg/dL — ABNORMAL HIGH (ref 70–99)
Potassium: 3.8 mmol/L (ref 3.5–5.1)
Sodium: 137 mmol/L (ref 135–145)

## 2020-04-05 LAB — HEPATIC FUNCTION PANEL
ALT: 26 U/L (ref 0–44)
AST: 21 U/L (ref 15–41)
Albumin: 3.8 g/dL (ref 3.5–5.0)
Alkaline Phosphatase: 77 U/L (ref 38–126)
Bilirubin, Direct: 0.1 mg/dL (ref 0.0–0.2)
Indirect Bilirubin: 1 mg/dL — ABNORMAL HIGH (ref 0.3–0.9)
Total Bilirubin: 1.1 mg/dL (ref 0.3–1.2)
Total Protein: 7.4 g/dL (ref 6.5–8.1)

## 2020-04-05 LAB — URINALYSIS, ROUTINE W REFLEX MICROSCOPIC
Bilirubin Urine: NEGATIVE
Glucose, UA: >=500 mg/dL — AB
Hgb urine dipstick: NEGATIVE
Ketones, ur: NEGATIVE mg/dL
Leukocytes,Ua: NEGATIVE
Nitrite: NEGATIVE
Protein, ur: 100 mg/dL — AB
Specific Gravity, Urine: >1.03 — ABNORMAL HIGH (ref 1.005–1.030)
pH: 6 (ref 5.0–8.0)

## 2020-04-05 LAB — CBG MONITORING, ED: Glucose-Capillary: 107 mg/dL — ABNORMAL HIGH (ref 70–99)

## 2020-04-05 LAB — LIPASE, BLOOD: Lipase: 42 U/L (ref 11–51)

## 2020-04-05 MED ORDER — PANTOPRAZOLE SODIUM 40 MG IV SOLR
40.0000 mg | Freq: Once | INTRAVENOUS | Status: AC
  Administered 2020-04-05: 17:00:00 40 mg via INTRAVENOUS
  Filled 2020-04-05: qty 40

## 2020-04-05 MED ORDER — CELECOXIB 100 MG PO CAPS: 100.0000 mg | ORAL_CAPSULE | Freq: Two times a day (BID) | ORAL | 0 refills | Status: AC

## 2020-04-05 MED ORDER — ONDANSETRON 4 MG PO TBDP: 4.0000 mg | ORAL_TABLET | Freq: Three times a day (TID) | ORAL | 0 refills | Status: DC | PRN

## 2020-04-05 MED ORDER — SODIUM CHLORIDE 0.9 % IV BOLUS
1000.0000 mL | Freq: Once | INTRAVENOUS | Status: AC
  Administered 2020-04-05: 14:00:00 1000 mL via INTRAVENOUS

## 2020-04-05 MED ORDER — KETOROLAC TROMETHAMINE 15 MG/ML IJ SOLN
15.0000 mg | Freq: Once | INTRAMUSCULAR | Status: AC
  Administered 2020-04-05: 15 mg via INTRAVENOUS
  Filled 2020-04-05: qty 1

## 2020-04-05 MED ORDER — SODIUM CHLORIDE 0.9 % IV BOLUS
500.0000 mL | Freq: Once | INTRAVENOUS | Status: AC
  Administered 2020-04-05: 17:00:00 500 mL via INTRAVENOUS

## 2020-04-05 MED ORDER — PROMETHAZINE HCL 25 MG/ML IJ SOLN
25.0000 mg | Freq: Once | INTRAMUSCULAR | Status: AC
  Administered 2020-04-05: 17:00:00 25 mg via INTRAVENOUS
  Filled 2020-04-05: qty 1

## 2020-04-05 MED ORDER — ONDANSETRON HCL 4 MG/2ML IJ SOLN
4.0000 mg | Freq: Once | INTRAMUSCULAR | Status: AC
  Administered 2020-04-05: 14:00:00 4 mg via INTRAVENOUS
  Filled 2020-04-05: qty 2

## 2020-04-05 NOTE — ED Notes (Signed)
Patient transported to Ultrasound 

## 2020-04-05 NOTE — ED Triage Notes (Signed)
Pt c/o intermittent n/v/d after eating x 1+year-increase in sx and with RUQ pain-pt was seen by PCP at [facility] and sent to ED-NAD-steady gait

## 2020-04-05 NOTE — ED Notes (Signed)
Water given for fluid challenge  ?

## 2020-04-05 NOTE — ED Provider Notes (Addendum)
[name] EMERGENCY DEPARTMENT Provider Note   CSN: [id number] Arrival date & time: 04/05/20  1212     History Chief Complaint  Patient presents with  . Abdominal Pain    [name] is a 59 y.o. male with PMH significant for type II DM, renal cell carcinoma s/p nephrectomy, OSA, and chronic biliary complaints who presents to the ED with worsening RUQ abdominal pain and emesis.  Patient reports that he was seen at the [facility] and immediately sent to the ED for evaluation.  He states that for the past few days she has been having numerous episodes of nonbloody emesis, particularly affiliated with ingestion of fatty foods.  He also is experiencing significant RUQ abdominal pain.  He has received a HIDA scan in the past, but was supposed to have another one performed in the past year and he never followed up.  His wife is an Therapist, sports she reports that he was running a little bit of low-grade fever the other day, but no documented temperature over 100F.  He states that he has had these episodes in the past and that they wax and wane.  He also endorses pale, loose stools.  He denies any headache or dizziness, document fevers or chills, chest pain or difficulty breathing, cough, hematemesis, urinary symptoms, hematochezia, melena, or other symptoms.  HPI     Past Medical History:  Diagnosis Date  . Arthritis   . Asthma   . Cancer of kidney ([place])    s/p L nephrectomy (renal cell carcinoma)  . Carpal tunnel syndrome   . Compression fracture of lumbar vertebra ([place])   . Depression   . Deviated septum   . Diabetes ([place])   . Fatty liver 03/08/2017  . Fibromyalgia   . Hepatitis A 1980  . History of kidney cancer    Removed left kidney   . Hypercholesteremia   . Hypertension   . Kidney disease   . Migraine   . Morbid obesity ([place]) 01/06/2017  . Obesity   . Sinus congestion   . Sleep apnea   . Thyroid disease     Patient Active Problem List   Diagnosis Date Noted  . Moderate  persistent asthma without complication [id number]  . Seasonal and perennial allergic rhinoconjunctivitis 04/04/2019  . Seasonal allergic rhinitis due to pollen 01/02/2019  . Seasonal allergic conjunctivitis 01/02/2019  . Deviated nasal septum 01/02/2019  . History of kidney cancer 01/02/2019  . H/O left radical nephrectomy 01/02/2019  . Idiopathic urticaria 01/02/2019  . Degenerative lumbar spinal stenosis 11/10/2017  . Hypersomnia with sleep apnea 03/10/2017  . Fatty liver 03/08/2017  . Morbid obesity ([place]) 01/06/2017  . Dysphagia 08/28/2016  . Hypothyroidism 08/28/2016  . Solitary kidney, acquired 12/10/2015  . Gastroesophageal reflux disease without esophagitis 12/10/2015  . Erectile dysfunction 12/10/2015  . BPH (benign prostatic hyperplasia) 12/10/2015  . Hypogonadism in male 12/10/2015  . Depression 12/10/2015  . Renal cell carcinoma ([place]) 12/10/2015  . Asthma 11/08/2015  . Obstructive sleep apnea with use of bilevel positive airway pressure (BPAP) 11/08/2015  . Essential hypertension 11/08/2015  . Hypercholesterolemia 11/08/2015  . Diabetes mellitus without complication ([place]) [id number]  . Chronic renal insufficiency 11/08/2015  . DJD (degenerative joint disease) of knee 11/21/2012  . S/P total knee arthroplasty 11/21/2012    Past Surgical History:  Procedure Laterality Date  . ANKLE SURGERY    . BACK SURGERY  2007  . COLONOSCOPY WITH ESOPHAGOGASTRODUODENOSCOPY (EGD)  [facility]   [facility]  hern[facility] diaphragm  . HAND SURGERY Right   . KNEE SURGERY Left    X2  . NASAL SEPTUM SURGERY  02/22/2019   turbinate reduction  . NEPHRECTOMY Left 2003  . ROTATOR CUFF REPAIR Right        Family History  Problem Relation Age of Onset  . Glaucoma Maternal Grandmother   . Leukemia Maternal Grandfather   . Heart attack Brother   . Diabetes Brother   . Allergies Brother   . Diabetes Brother   . Allergies Sister   . Asthma Sister   . Heart disease Mother   .  Rheumatologic disease Mother   . Lupus Mother   . Angioedema Mother   . Irritable bowel syndrome Mother   . Diverticulosis Mother   . Other Mother 36       Colon Resecction   . Heart disease Father   . Diabetes Father   . Liver disease Father   . Rheumatologic disease Sister   . Cancer Paternal Grandfather        leukemia  . Asthma Child   . Allergic rhinitis Child   . Esophageal cancer Maternal Aunt   . Eczema Neg Hx   . Urticaria Neg Hx     Social History   Tobacco Use  . Smoking status: Former Smoker    Packs/day: 1.00    Years: 10.00    Pack years: 10.00    Types: Cigarettes    Quit date: 1986    Years since quitting: 35.2  . Smokeless tobacco: Never Used  Substance Use Topics  . Alcohol use: Yes    Alcohol/week: 0.0 standard drinks    Comment: occasionally  . Drug use: Never    Home Medications Prior to Admission medications   Medication Sig Start Date End Date Taking? Authorizing Provider  ALBUTEROL IN Inhale 2 puffs into the lungs as needed.    [provider]  atorvastatin (LIPITOR) 80 MG tablet Take 80 mg by mouth daily.    [provider]  celecoxib (CELEBREX) 100 MG capsule Take 1 capsule (100 mg total) by mouth 2 (two) times daily for 10 days. 04/05/20 04/15/20  Corena Herter, PA-C  cetirizine (ZYRTEC) 10 MG tablet Take 10 mg by mouth daily.    [provider]  citalopram (CELEXA) 40 MG tablet Take 40 mg by mouth daily.    [provider]  empagliflozin (JARDIANCE) 25 MG TABS tablet Take 25 mg by mouth daily.    [provider]  EPINEPHrine (EPIPEN 2-PAK) 0.3 mg/0.3 mL IJ SOAJ injection Inject 0.3 mLs (0.3 mg total) into the muscle as needed for anaphylaxis. 04/12/19   Garnet Sierras, DO  Fluticasone Propionate Truett Perna) 93 MCG/ACT EXHU Place 2 sprays into the nose in the morning and at bedtime. 03/01/20   Garnet Sierras, DO  gabapentin (NEURONTIN) 600 MG tablet Take 600 mg by mouth 2 (two) times daily.     [provider]  glucagon (GLUCAGEN) 1 MG SOLR injection Inject 1 mg into the vein once as needed for low blood sugar.    [provider]  hydrochlorothiazide (HYDRODIURIL) 25 MG tablet Take 25 mg by mouth daily.    [provider]  insulin aspart protamine- aspart (NOVOLOG MIX 70/30) (70-30) 100 UNIT/ML injection Inject 62 Units into the skin 2 (two) times daily with a meal.    [provider]  Insulin Syringe-Needle U-100 (INSULIN SYRINGE .3CC/31GX5/16") 31G X 5/16" 0.3 ML MISC  by Does not apply route.    [provider]  ketotifen (ZADITOR) 0.025 % ophthalmic solution Place 1 drop into both eyes 2 (two) times daily.     [provider]  Lancets Glory Rosebush ULTRASOFT) lancets Use as instructed 12/10/15   [name], NP  levothyroxine (SYNTHROID) 75 MCG tablet Take 1 tablet (75 mcg total) by mouth daily. 07/24/19   [name], NP  Lidocaine HCl (ASPERCREME LIDOCAINE) 4 % LIQD Apply 1 application topically daily as needed.    [provider]  losartan (COZAAR) 25 MG tablet Take 100 mg by mouth daily.     [provider]  metFORMIN (GLUCOPHAGE-XR) 500 MG 24 hr tablet Take 1,000 mg by mouth 2 (two) times daily.     [provider]  montelukast (SINGULAIR) 10 MG tablet Take 10 mg by mouth at bedtime.    [provider]  NON FORMULARY Allergen immunotherapy    [provider]  Olopatadine HCl (PAZEO) 0.7 % SOLN Apply 1 drop to eye daily as needed (itchy/watery eyes). Patient not taking: Reported on 03/01/2020 04/04/19   Garnet Sierras, DO  ondansetron (ZOFRAN) 4 MG tablet Take 1 tablet (4 mg total) by mouth every 6 (six) hours as needed for nausea or vomiting. 03/13/19   Cirigliano, Vito V, DO  ONE TOUCH ULTRA TEST test strip USE AS INSTRUCTED TO CHECK BLOOD SUGAR THREE TIMES DAILY. 12/29/16   [name], NP  pantoprazole (PROTONIX) 40 MG tablet Take 40 mg by mouth daily.    [provider]   pioglitazone (ACTOS) 45 MG tablet Take 45 mg by mouth daily.    [provider]  Semaglutide,0.25 or 0.5MG /DOS, (OZEMPIC, 0.25 OR 0.5 MG/DOSE,) 2 MG/1.5ML SOPN Inject into the skin.    [provider]  tamsulosin (FLOMAX) 0.4 MG CAPS capsule TAKE 1 CAPSULE (0.4 MG TOTAL) BY MOUTH DAILY. 06/07/19   [name], NP  testosterone cypionate (DEPOTESTOSTERONE CYPIONATE) 200 MG/ML injection Inject 200 mg into the muscle.    [provider]  Tiotropium Bromide Monohydrate (SPIRIVA RESPIMAT) 1.25 MCG/ACT AERS Inhale 2 puffs into the lungs daily. 04/12/19   Garnet Sierras, DO  urea (CARMOL) 10 % cream Apply topically 3 (three) times daily.    [provider]    Allergies    Hydrocodone and Niacin and related  Review of Systems   Review of Systems  All other systems reviewed and are negative.   Physical Exam Updated Vital Signs BP 115/79 (BP Location: Left Arm)   Pulse 75   Temp 98.8 F (37.1 C) (Oral)   Resp 18   Ht 5\' 4"  (1.626 m)   Wt 98.9 kg   SpO2 96%   BMI 37.42 kg/m   Physical Exam Vitals and nursing note reviewed. Exam conducted with a chaperone present.  Constitutional:      Appearance: Normal appearance.  HENT:     Head: Normocephalic and atraumatic.  Eyes:     General: No scleral icterus.    Conjunctiva/sclera: Conjunctivae normal.  Cardiovascular:     Rate and Rhythm: Normal rate and regular rhythm.     Pulses: Normal pulses.     Heart sounds: Normal heart sounds.  Pulmonary:     Effort: Pulmonary effort is normal. No respiratory distress.     Breath sounds: Normal breath sounds. No wheezing or rales.  Abdominal:     Tenderness: There is no right CVA tenderness or left CVA tenderness.     Comments: Soft,  nondistended.  TTP in RUQ.  Positive Murphy sign.  No TTP elsewhere.  No guarding.  No overlying skin changes.  NABS.  Musculoskeletal:        General: Normal range of motion.     Cervical back: Normal range of motion and  neck supple. No rigidity.     Right lower leg: No edema.     Left lower leg: No edema.  Skin:    General: Skin is dry.     Capillary Refill: Capillary refill takes less than 2 seconds.  Neurological:     Mental Status: He is alert and oriented to person, place, and time.     GCS: GCS eye subscore is 4. GCS verbal subscore is 5. GCS motor subscore is 6.  Psychiatric:        Mood and Affect: Mood normal.        Behavior: Behavior normal.        Thought Content: Thought content normal.     ED Results / Procedures / Treatments   Labs (all labs ordered are listed, but only abnormal results are displayed) Labs Reviewed  BASIC METABOLIC PANEL - Abnormal; Notable for the following components:      Result Value   Glucose, Bld 112 (*)    BUN 21 (*)    Calcium 8.6 (*)    All other components within normal limits  HEPATIC FUNCTION PANEL - Abnormal; Notable for the following components:   Indirect Bilirubin 1.0 (*)    All other components within normal limits  URINALYSIS, ROUTINE W REFLEX MICROSCOPIC - Abnormal; Notable for the following components:   Specific Gravity, Urine >1.030 (*)    Glucose, UA >=500 (*)    Protein, ur 100 (*)    All other components within normal limits  CBC WITH DIFFERENTIAL/PLATELET - Abnormal; Notable for the following components:   Monocytes Absolute 1.2 (*)    All other components within normal limits  URINALYSIS, MICROSCOPIC (REFLEX) - Abnormal; Notable for the following components:   Bacteria, UA MANY (*)    All other components within normal limits  CBG MONITORING, ED - Abnormal; Notable for the following components:   Glucose-Capillary 107 (*)    All other components within normal limits  LIPASE, BLOOD    EKG None  Radiology US Abdomen Limited  Result Date: 04/05/2020 CLINICAL DATA:  Right upper quadrant pain. EXAM: ULTRASOUND ABDOMEN LIMITED RIGHT UPPER QUADRANT COMPARISON:  02/17/2019 complete abdominal ultrasound. 05/11/2016 CT abdomen.  FINDINGS: Gallbladder: No gallstones or wall thickening visualized. Rounded echogenic foci along the gallbladder wall measuring up to 6 mm reflect polyps. Sonographic Percell Miller sign was noted by sonographer. No pericholecystic free fluid. Common bile duct: Diameter: 3.8 mm.  No biliary ductal dilatation. Liver: Diffusely echogenic and heterogenous hepatic parenchyma. No focal hepatic lesions. Portal vein is patent on color Doppler imaging with normal direction of blood flow towards the liver. Other: No ascites. IMPRESSION: Gallbladder polyps measuring up to 6 mm.  No follow-up necessary. Hepatic steatosis.  No focal hepatic lesions identified. Electronically Signed   By: Primitivo Gauze M.D.   On: 04/05/2020 14:51    Procedures Procedures (including critical care time)  Medications Ordered in ED Medications  sodium chloride 0.9 % bolus 1,000 mL ( Intravenous Stopped 04/05/20 1507)  ondansetron (ZOFRAN) injection 4 mg (4 mg Intravenous Given 04/05/20 1407)  promethazine (PHENERGAN) injection 25 mg (25 mg Intravenous Given 04/05/20 1651)  pantoprazole (PROTONIX) injection 40 mg (40 mg Intravenous Given 04/05/20 1656)  sodium  chloride 0.9 % bolus 500 mL ( Intravenous Stopped 04/05/20 1752)  ketorolac (TORADOL) 15 MG/ML injection 15 mg (15 mg Intravenous Given 04/05/20 1737)    ED Course  I have reviewed the triage vital signs and the nursing notes.  Pertinent labs & imaging results that were available during my care of the patient were reviewed by me and considered in my medical decision making (see chart for details).    MDM Rules/Calculators/A&P                      Patient history and physical exam is concerning for cholecystitis.  Obtained right upper quadrant ultrasound which demonstrated gallbladder polyps measuring up to 6 mm as well as hepatic steatosis, but no cholecystitis or other acute findings.  Patient's laboratory work-up is reassuring.  UA demonstrates elevated specific gravity in addition  to glucosuria.  He is likely mildly dehydrated due to diminished p.o. intake in setting of nausea symptoms.  CBC demonstrates no leukocytosis concerning for infection and his BMP demonstrates normal renal function.  He is without any electrolyte derangement despite reported emesis and loose stools.  His hepatic function panel was very reassuring given his RUQ discomfort and his history. Given this is a recurring issue for him, low suspicion for colitis or other intra-abdominal pathology that would warrant further investigation with CT.  I obtained history from his wife, and RN, who states that his last HIDA scan demonstrated reduced ejection fraction.  Based on his description of symptoms, I suspect that he likely has cholestasis.  He states that this current episode was precipitated by a meal involving bacon, [place] toast, and other fatty foods.  Strongly encouraging patient to avoid a diet high in fatty foods until he has evaluation by gastroenterologist.  He needs to have his repeat HIDA scan and given his chronicity of illness, he would benefit from surgical evaluation.  Will refer to [facility] gastroenterology.    During initial p.o. challenge, patient was still feeling nausea symptoms.  Provided Phenergan IV as well as Protonix and on subsequent evaluation, patient was feeling much improved.  He is still endorsing some RUQ abdominal discomfort, particularly worse with torso rotation and increased intra-abdominal pressure.  However, low suspicion for musculoskeletal etiology given his clear association with ingestion of fatty foods.  Provided Toradol here in the ED.  Will discharge home with pain medication, antiemetics, and instruction to avoid fatty food.  He will need gastroenterology follow-up regarding his biliary disease.  Discussed case with Dr. Gilford Raid who reviewed his work-up and agrees with assessment and plan.  Strict ED return precautions discussed.  All of the evaluation and work-up results  were discussed with the patient and any family at bedside. They were provided opportunity to ask any additional questions and have none at this time. They have expressed understanding of verbal discharge instructions as well as return precautions and are agreeable to the plan.    Final Clinical Impression(s) / ED Diagnoses Final diagnoses:  Right upper quadrant pain    Rx / DC Orders ED Discharge Orders         Ordered    celecoxib (CELEBREX) 100 MG capsule  2 times daily     04/05/20 1809           Corena Herter, PA-C 04/05/20 1810    Corena Herter, PA-C 04/05/20 1811    Isla Pence, MD 04/06/20 [phone]

## 2020-04-05 NOTE — ED Notes (Signed)
Pt tolerated saltine crackers and Ginger Ale

## 2020-04-05 NOTE — Discharge Instructions (Addendum)
Please call [facility] gastroenterology to schedule appointment for ongoing evaluation management of your right upper quadrant abdominal pain.  You need to have a repeat HIDA scan.  Given the chronicity of your symptoms, despite negative work-up, suspect that you may ultimately require surgical evaluation.  Please also notify your primary care provider regarding today's encounter.  Please take the Zofran, as prescribed.  It will dissolve underneath your tongue.  Avoid fatty foods.  I have also prescribed some pain medication which I would like you to take, as directed.  Please continue to take your PPI at home. Discontinue the pain medication if you develop any epigastric pain or black stools.  Return to the ED or seek immediate medical attention should experience any new or worsening symptoms. This includes uncontrolled nausea vomiting, worsening pain symptoms, fevers uncontrolled by Tylenol or ibuprofen, or any other new or worsening symptoms.

## 2020-04-08 ENCOUNTER — Ambulatory Visit (INDEPENDENT_AMBULATORY_CARE_PROVIDER_SITE_OTHER): Payer: No Typology Code available for payment source

## 2020-04-08 DIAGNOSIS — J309 Allergic rhinitis, unspecified: Secondary | ICD-10-CM | POA: Diagnosis not present

## 2020-04-09 ENCOUNTER — Ambulatory Visit: Payer: No Typology Code available for payment source | Admitting: Gastroenterology

## 2020-04-09 ENCOUNTER — Other Ambulatory Visit: Payer: Self-pay

## 2020-04-09 ENCOUNTER — Encounter: Payer: Self-pay | Admitting: Gastroenterology

## 2020-04-09 VITALS — BP 148/86 | HR 92 | Temp 97.1°F | Ht 64.5 in | Wt 219.0 lb

## 2020-04-09 DIAGNOSIS — K219 Gastro-esophageal reflux disease without esophagitis: Secondary | ICD-10-CM | POA: Diagnosis not present

## 2020-04-09 DIAGNOSIS — R112 Nausea with vomiting, unspecified: Secondary | ICD-10-CM | POA: Diagnosis not present

## 2020-04-09 DIAGNOSIS — K824 Cholesterolosis of gallbladder: Secondary | ICD-10-CM

## 2020-04-09 DIAGNOSIS — R1011 Right upper quadrant pain: Secondary | ICD-10-CM

## 2020-04-09 DIAGNOSIS — Z01818 Encounter for other preprocedural examination: Secondary | ICD-10-CM

## 2020-04-09 DIAGNOSIS — R197 Diarrhea, unspecified: Secondary | ICD-10-CM | POA: Diagnosis not present

## 2020-04-09 DIAGNOSIS — R140 Abdominal distension (gaseous): Secondary | ICD-10-CM

## 2020-04-09 DIAGNOSIS — Z86010 Personal history of colonic polyps: Secondary | ICD-10-CM

## 2020-04-09 MED ORDER — CLENPIQ 10-3.5-12 MG-GM -GM/160ML PO SOLN: 1.0000 | Freq: Once | ORAL | 0 refills | Status: AC

## 2020-04-09 MED FILL — CLENPIQ 10-3.5-12 MG-GM -GM: 10-3.5-12 M | 1 days supply | Qty: 320 | Fill #0

## 2020-04-09 NOTE — Progress Notes (Signed)
Chief Complaint: Nausea, vomiting, RUQ pain, diarrhea, bloating  Referring Provider:    [name], NP   HPI:    [name] is a 59 y.o. male with history of diabetes, RCC  S/p left nephrectomy 2003, OSA, HTN, HLD, obesity (BMI 37), GERD, asthma, referred back to the [facility] for evaluation of RUQ pain and nusea/vomiting.  Was seen by me on 03/13/2019 for similar issues.  Recommended HIDA and EGD at that time; not completed due to the onset of Covid restrictions at that same time.  Also recommended colonoscopy for diarrhea; again not completed due to Covid restrictions.  Made consideration for GES, particularly with A1c 8.1 at that time.  A1c has not been repeated since then.  Intermittent episodes of RUQ pain, typically 30-45 minutes post p.o., lasting 3-4 hours.  Can be associated with bloating, nausea/vomiting.  worse after eating fatty foods.  Most recent episode started 7-10 days ago, with ER evaluation on 04/05/2020 as below.  No hematochezia or melena.  Reports he previously had a HIDA scan x2 (approx 2009 at [place], and [place] in 2015); only able to view IN from [place] 2015 as below.  Was seen in the ER for this issue on 04/05/2020. RUQ [place] with GB polyps measuring up to 6 mm, hepatic steatosis, normal ducts and no cholecystitis.  Normal CBC, BMP, liver enzymes, lipase. Treated with IVF, Zofran, Phenergan, Protonix, Toradol with improvement and discharged home.  Today, he states he continues to have the same index sxs as above, although improved from last week. Still with abdominal bloating, n/v, diarrhea.  Postprandial urgency and diarrhea, typically within 30 minutes of eating independent of food types.  Has not made sig dietary mods over the year. Eating fast foods on the go.   -RUQ [place] (01/2019): 5 mm GB polyp, no stones, CBD 6.6 mm, hepatic steatosis without duct dilation -RUQ [place] (09/2016): Normal GB, hepatic steatosis, CBD 3 mm -HIDA (2015) 71% EF.  Nausea after CCK administration  Separately, history of reflux, controlled with pantoprazole.  Has received first dose of Covid vaccine (Moderna).  Endoscopic history: -EGD (2015 at the [facility]): Hiatal hernia per patient -Colonoscopy (approximately 2015 at the [facility]): Notable for a few polyps, and he thinks that recommendation repeat in 10 years, but not sure.  Not available for review in EMR.   Past Medical History:  Diagnosis Date  . Arthritis   . Asthma   . Cancer of kidney ([place])    s/p L nephrectomy (renal cell carcinoma)  . Carpal tunnel syndrome   . Compression fracture of lumbar vertebra ([place])   . Depression   . Deviated septum   . Diabetes ([place])   . Fatty liver 03/08/2017  . Fibromyalgia   . Hepatitis A 1980  . History of kidney cancer    Removed left kidney   . Hypercholesteremia   . Hypertension   . Kidney disease   . Migraine   . Morbid obesity ([place]) 01/06/2017  . Obesity   . Sinus congestion   . Sleep apnea   . Thyroid disease      Past Surgical History:  Procedure Laterality Date  . ANKLE SURGERY    . BACK SURGERY  2007  . COLONOSCOPY WITH ESOPHAGOGASTRODUODENOSCOPY (EGD)  [facility]  . HAND SURGERY Right   . KNEE SURGERY Left    X2  . NASAL  SEPTUM SURGERY  02/22/2019   turbinate reduction  . NEPHRECTOMY Left 2003  . ROTATOR CUFF REPAIR Right    Family History  Problem Relation Age of Onset  . Glaucoma Maternal Grandmother   . Leukemia Maternal Grandfather   . Heart attack Brother   . Diabetes Brother   . Allergies Brother   . Diabetes Brother   . Allergies Sister   . Asthma Sister   . Heart disease Mother   . Rheumatologic disease Mother   . Lupus Mother   . Angioedema Mother   . Irritable bowel syndrome Mother   . Diverticulosis Mother   . Other Mother 63       Colon Resecction   . Heart disease Father   . Diabetes Father   . Liver disease Father   . Rheumatologic disease Sister   . Cancer  Paternal Grandfather        leukemia  . Asthma Child   . Allergic rhinitis Child   . Esophageal cancer Maternal Aunt   . Eczema Neg Hx   . Urticaria Neg Hx    Social History   Tobacco Use  . Smoking status: Former Smoker    Packs/day: 1.00    Years: 10.00    Pack years: 10.00    Types: Cigarettes    Quit date: 1986    Years since quitting: 35.3  . Smokeless tobacco: Never Used  Substance Use Topics  . Alcohol use: Yes    Alcohol/week: 0.0 standard drinks    Comment: occasionally  . Drug use: Never   Current Outpatient Medications  Medication Sig Dispense Refill  . ALBUTEROL IN Inhale 2 puffs into the lungs as needed.    Marland Kitchen atorvastatin (LIPITOR) 80 MG tablet Take 80 mg by mouth daily.    . celecoxib (CELEBREX) 100 MG capsule Take 1 capsule (100 mg total) by mouth 2 (two) times daily for 10 days. 20 capsule 0  . cetirizine (ZYRTEC) 10 MG tablet Take 10 mg by mouth daily.    . citalopram (CELEXA) 40 MG tablet Take 40 mg by mouth daily.    . empagliflozin (JARDIANCE) 25 MG TABS tablet Take 25 mg by mouth daily.    Marland Kitchen EPINEPHrine (EPIPEN 2-PAK) 0.3 mg/0.3 mL IJ SOAJ injection Inject 0.3 mLs (0.3 mg total) into the muscle as needed for anaphylaxis. 1 Device 2  . Fluticasone Propionate (XHANCE) 93 MCG/ACT EXHU Place 2 sprays into the nose in the morning and at bedtime. 32 mL 11  . gabapentin (NEURONTIN) 600 MG tablet Take 600 mg by mouth 2 (two) times daily.     Marland Kitchen glucagon (GLUCAGEN) 1 MG SOLR injection Inject 1 mg into the vein once as needed for low blood sugar.    . hydrochlorothiazide (HYDRODIURIL) 25 MG tablet Take 25 mg by mouth daily.    . insulin aspart protamine- aspart (NOVOLOG MIX 70/30) (70-30) 100 UNIT/ML injection Inject 62 Units into the skin 2 (two) times daily with a meal.    . Insulin Syringe-Needle U-100 (INSULIN SYRINGE .3CC/31GX5/16") 31G X 5/16" 0.3 ML MISC by Does not apply route.    Marland Kitchen ketotifen (ZADITOR) 0.025 % ophthalmic solution Place 1 drop into both eyes 2  (two) times daily.     . Lancets (ONETOUCH ULTRASOFT) lancets Use as instructed 100 each 2  . levothyroxine (SYNTHROID) 75 MCG tablet Take 1 tablet (75 mcg total) by mouth daily. 30 tablet 3  . Lidocaine HCl (ASPERCREME LIDOCAINE) 4 % LIQD Apply 1 application topically  daily as needed.    Marland Kitchen losartan (COZAAR) 25 MG tablet Take 100 mg by mouth daily.     . metFORMIN (GLUCOPHAGE-XR) 500 MG 24 hr tablet Take 1,000 mg by mouth 2 (two) times daily.     . montelukast (SINGULAIR) 10 MG tablet Take 10 mg by mouth at bedtime.    . NON FORMULARY Allergen immunotherapy    . Olopatadine HCl (PAZEO) 0.7 % SOLN Apply 1 drop to eye daily as needed (itchy/watery eyes). 1 Bottle 3  . ondansetron (ZOFRAN ODT) 4 MG disintegrating tablet Take 1 tablet (4 mg total) by mouth every 8 (eight) hours as needed for nausea or vomiting. 10 tablet 0  . ondansetron (ZOFRAN) 4 MG tablet Take 1 tablet (4 mg total) by mouth every 6 (six) hours as needed for nausea or vomiting. 30 tablet 1  . ONE TOUCH ULTRA TEST test strip USE AS INSTRUCTED TO CHECK BLOOD SUGAR THREE TIMES DAILY. 100 each 5  . pantoprazole (PROTONIX) 40 MG tablet Take 40 mg by mouth daily.    . pioglitazone (ACTOS) 45 MG tablet Take 45 mg by mouth daily.    . Semaglutide,0.25 or 0.5MG /DOS, (OZEMPIC, 0.25 OR 0.5 MG/DOSE,) 2 MG/1.5ML SOPN Inject into the skin.    . tamsulosin (FLOMAX) 0.4 MG CAPS capsule TAKE 1 CAPSULE (0.4 MG TOTAL) BY MOUTH DAILY. 30 capsule 5  . testosterone cypionate (DEPOTESTOSTERONE CYPIONATE) 200 MG/ML injection Inject 200 mg into the muscle.    . Tiotropium Bromide Monohydrate (SPIRIVA RESPIMAT) 1.25 MCG/ACT AERS Inhale 2 puffs into the lungs daily. 1 Inhaler 3  . urea (CARMOL) 10 % cream Apply topically 3 (three) times daily.     No current facility-administered medications for this visit.   Allergies  Allergen Reactions  . Hydrocodone Itching  . Niacin And Related Hives     Review of Systems: All systems reviewed and negative  except where noted in HPI.     Physical Exam:    Wt Readings from Last 3 Encounters:  04/09/20 219 lb (99.3 kg)  04/05/20 218 lb (98.9 kg)  03/01/20 225 lb (102.1 kg)    BP (!) 148/86   Pulse 92   Temp (!) 97.1 F (36.2 C)   Ht 5' 4.5" (1.638 m)   Wt 219 lb (99.3 kg)   BMI 37.01 kg/m  Constitutional:  Pleasant, in no acute distress. Psychiatric: Normal mood and affect. Behavior is normal. EENT: Pupils normal.  Conjunctivae are normal. No scleral icterus. Neck supple. No cervical LAD. Cardiovascular: Normal rate, regular rhythm. No edema Pulmonary/chest: Effort normal and breath sounds normal. No wheezing, rales or rhonchi. Abdominal: Mild right-sided abdominal discomfort without TTP.  No rebound or guarding.  No peritoneal signs.  Soft, nondistended. Bowel sounds active throughout. There are no masses palpable. No hepatomegaly. Neurological: Alert and oriented to person place and time. Skin: Skin is warm and dry. No rashes noted.   ASSESSMENT AND PLAN;   1) RUQ pain -HIDA scan -EGD to assess for mucosal/luminal pathology with gastric biopsies  2) Nausea/Vomiting (episodically these -EGD to assess for mucosal/luminal pathology, PUD, gastritis, GOO with gastric biopsies as above -If EGD otherwise unrevealing, consider GES -Repeat A1c  3) Diarrhea: 4) Abdominal bloating: -EGD with duodenal biopsies -Colonoscopy with random and directed colonic biopsies to evaluate for MC, IBD, etc. -Start low FODMAP diet.  Provided with handout in detail instruction today -Start fiber supplement such as Benefiber or Citrucel.  Would avoid Metamucil due to bloating -If above work-up otherwise  unrevealing, increasing suspicion for IBS-D and/for polypharmacy (med ADR)  5) History of colon polyps -Can assess for polyp surveillance at time of colonoscopy as above  6) GERD: -Well-controlled on current therapy  7) Gallbladder polyp: -Serial ultrasounds have demonstrated subcentimeter GB  polyp.  HIDA scan as above to assess for GB function.  Pending results, may benefit from referral to General Surgery.  The indications, risks, and benefits of EGD and colonoscopy were explained to the patient in detail. Risks include but are not limited to bleeding, perforation, adverse reaction to medications, and cardiopulmonary compromise. Sequelae include but are not limited to the possibility of surgery, hositalization, and mortality. The patient verbalized understanding and wished to proceed. All questions answered, referred to scheduler and bowel prep ordered. Further recommendations pending results of the exam.     Lavena Bullion, DO, FACG  04/09/2020, 9:15 AM   [name], NP

## 2020-04-09 NOTE — Patient Instructions (Signed)
You have been scheduled for an endoscopy and colonoscopy. Please follow the written instructions given to you at your visit today. Please pick up your prep supplies at the pharmacy within the next 1-3 days. If you use inhalers (even only as needed), please bring them with you on the day of your procedure. Your physician has requested that you go to www.startemmi.com and enter the access code given to you at your visit today. This web site gives a general overview about your procedure. However, you should still follow specific instructions given to you by our office regarding your preparation for the procedure.  You have been scheduled for a HIDA scan at [facility] (1st floor) on 05/19/20. Please arrive 15 minutes prior to your scheduled appointment at  [id number]. Make certain not to have anything to eat or drink at least 6 hours prior to your test. Should this appointment date or time not work well for you, please call radiology scheduling at [phone].  _____________________________________________________________________ hepatobiliary (HIDA) scan is an imaging procedure used to diagnose problems in the liver, gallbladder and bile ducts. In the HIDA scan, a radioactive chemical or tracer is injected into a vein in your arm. The tracer is handled by the liver like bile. Bile is a fluid produced and excreted by your liver that helps your digestive system break down fats in the foods you eat. Bile is stored in your gallbladder and the gallbladder releases the bile when you eat a meal. A special nuclear medicine scanner (gamma camera) tracks the flow of the tracer from your liver into your gallbladder and small intestine.  During your HIDA scan  You'll be asked to change into a hospital gown before your HIDA scan begins. Your health care team will position you on a table, usually on your back. The radioactive tracer is then injected into a vein in your arm.The tracer travels through your bloodstream to your  liver, where it's taken up by the bile-producing cells. The radioactive tracer travels with the bile from your liver into your gallbladder and through your bile ducts to your small intestine.You may feel some pressure while the radioactive tracer is injected into your vein. As you lie on the table, a special gamma camera is positioned over your abdomen taking pictures of the tracer as it moves through your body. The gamma camera takes pictures continually for about an hour. You'll need to keep still during the HIDA scan. This can become uncomfortable, but you may find that you can lessen the discomfort by taking deep breaths and thinking about other things. Tell your health care team if you're uncomfortable. The radiologist will watch on a computer the progress of the radioactive tracer through your body. The HIDA scan may be stopped when the radioactive tracer is seen in the gallbladder and enters your small intestine. This typically takes about an hour. In some cases extra imaging will be performed if original images aren't satisfactory, if morphine is given to help visualize the gallbladder or if the medication CCK is given to look at the contraction of the gallbladder. This test typically takes 2 hours to complete. ________________________________________________________  Your provider has requested that you go to the basement level for lab work at [facility] in [place] [address]. Press "B" on the elevator. The lab is located at the first door on the left as you exit the elevator.  Please start taking a daily fiber supplement such as citrucel, benefiber, metamucil, or fiber choice.  It was a pleasure to see you today!  Gerrit Heck, D.O. ________________

## 2020-04-15 ENCOUNTER — Ambulatory Visit (INDEPENDENT_AMBULATORY_CARE_PROVIDER_SITE_OTHER): Payer: No Typology Code available for payment source

## 2020-04-15 DIAGNOSIS — J309 Allergic rhinitis, unspecified: Secondary | ICD-10-CM | POA: Diagnosis not present

## 2020-04-19 ENCOUNTER — Other Ambulatory Visit: Payer: Self-pay

## 2020-04-19 ENCOUNTER — Encounter (HOSPITAL_COMMUNITY)
Admission: RE | Admit: 2020-04-19 | Discharge: 2020-04-19 | Disposition: A | Discharge status: Home / Self Care | Payer: Medicare Other | Source: Ambulatory Visit | Attending: Gastroenterology | Admitting: Gastroenterology

## 2020-04-19 DIAGNOSIS — R197 Diarrhea, unspecified: Secondary | ICD-10-CM | POA: Diagnosis not present

## 2020-04-19 DIAGNOSIS — Z01818 Encounter for other preprocedural examination: Secondary | ICD-10-CM | POA: Insufficient documentation

## 2020-04-19 DIAGNOSIS — R112 Nausea with vomiting, unspecified: Secondary | ICD-10-CM | POA: Insufficient documentation

## 2020-04-19 DIAGNOSIS — R140 Abdominal distension (gaseous): Secondary | ICD-10-CM | POA: Insufficient documentation

## 2020-04-19 DIAGNOSIS — K219 Gastro-esophageal reflux disease without esophagitis: Secondary | ICD-10-CM | POA: Insufficient documentation

## 2020-04-19 DIAGNOSIS — R1011 Right upper quadrant pain: Secondary | ICD-10-CM | POA: Insufficient documentation

## 2020-04-19 MED ORDER — TECHNETIUM TC 99M MEBROFENIN IV KIT
5.2000 | PACK | Freq: Once | INTRAVENOUS | Status: AC | PRN
  Administered 2020-04-19: 5.2 via INTRAVENOUS

## 2020-04-22 ENCOUNTER — Ambulatory Visit (INDEPENDENT_AMBULATORY_CARE_PROVIDER_SITE_OTHER): Payer: No Typology Code available for payment source

## 2020-04-22 DIAGNOSIS — J309 Allergic rhinitis, unspecified: Secondary | ICD-10-CM

## 2020-04-29 ENCOUNTER — Encounter: Payer: Self-pay | Admitting: Gastroenterology

## 2020-04-30 ENCOUNTER — Ambulatory Visit (INDEPENDENT_AMBULATORY_CARE_PROVIDER_SITE_OTHER): Payer: No Typology Code available for payment source

## 2020-04-30 DIAGNOSIS — J309 Allergic rhinitis, unspecified: Secondary | ICD-10-CM | POA: Diagnosis not present

## 2020-05-07 ENCOUNTER — Ambulatory Visit (INDEPENDENT_AMBULATORY_CARE_PROVIDER_SITE_OTHER): Payer: No Typology Code available for payment source

## 2020-05-07 DIAGNOSIS — J309 Allergic rhinitis, unspecified: Secondary | ICD-10-CM

## 2020-05-08 ENCOUNTER — Telehealth: Payer: Self-pay | Admitting: Gastroenterology

## 2020-05-08 NOTE — Telephone Encounter (Signed)
Ok. Thank you for the update

## 2020-05-08 NOTE — Telephone Encounter (Signed)
Hi Dr. Bryan Lemma,  Patient called having to cancel procedure for 05/10/20 due to not having [place].    Thank you

## 2020-05-10 ENCOUNTER — Encounter: Payer: No Typology Code available for payment source | Admitting: Gastroenterology

## 2020-05-16 ENCOUNTER — Ambulatory Visit (INDEPENDENT_AMBULATORY_CARE_PROVIDER_SITE_OTHER): Payer: No Typology Code available for payment source

## 2020-05-16 DIAGNOSIS — J309 Allergic rhinitis, unspecified: Secondary | ICD-10-CM | POA: Diagnosis not present

## 2020-05-21 ENCOUNTER — Ambulatory Visit (INDEPENDENT_AMBULATORY_CARE_PROVIDER_SITE_OTHER): Payer: No Typology Code available for payment source

## 2020-05-21 DIAGNOSIS — J309 Allergic rhinitis, unspecified: Secondary | ICD-10-CM | POA: Diagnosis not present

## 2020-05-29 ENCOUNTER — Ambulatory Visit (INDEPENDENT_AMBULATORY_CARE_PROVIDER_SITE_OTHER): Payer: No Typology Code available for payment source

## 2020-05-29 DIAGNOSIS — J309 Allergic rhinitis, unspecified: Secondary | ICD-10-CM

## 2020-06-03 DIAGNOSIS — J301 Allergic rhinitis due to pollen: Secondary | ICD-10-CM

## 2020-06-03 NOTE — Progress Notes (Signed)
EXP 06/03/21 

## 2020-06-04 DIAGNOSIS — J302 Other seasonal allergic rhinitis: Secondary | ICD-10-CM | POA: Diagnosis not present

## 2020-06-12 ENCOUNTER — Ambulatory Visit (INDEPENDENT_AMBULATORY_CARE_PROVIDER_SITE_OTHER): Payer: No Typology Code available for payment source

## 2020-06-12 DIAGNOSIS — J309 Allergic rhinitis, unspecified: Secondary | ICD-10-CM

## 2020-06-18 ENCOUNTER — Ambulatory Visit (INDEPENDENT_AMBULATORY_CARE_PROVIDER_SITE_OTHER): Payer: No Typology Code available for payment source | Admitting: *Deleted

## 2020-06-18 DIAGNOSIS — J309 Allergic rhinitis, unspecified: Secondary | ICD-10-CM | POA: Diagnosis not present

## 2020-07-02 ENCOUNTER — Ambulatory Visit (INDEPENDENT_AMBULATORY_CARE_PROVIDER_SITE_OTHER): Payer: No Typology Code available for payment source

## 2020-07-02 DIAGNOSIS — J309 Allergic rhinitis, unspecified: Secondary | ICD-10-CM | POA: Diagnosis not present

## 2020-07-04 NOTE — Progress Notes (Deleted)
Follow Up Note  RE: [name] MRN: [id number] DOB: [birth year] Date of Office Visit: 07/05/2020  Referring provider: [name], NP Primary care provider: [name], NP  Chief Complaint: No chief complaint on file.  History of Present Illness: I had the pleasure of seeing [name] for a follow up visit at the [facility] on 07/04/2020. He is a 59 y.o. male, who is being followed for asthma, allergic rhinoconjunctivitis on immunotherapy. His previous allergy office visit was on 03/01/2020 with Dr. Maudie Mercury. Today is a regular follow up visit.  Moderate persistent asthma without complication Was doing well until the weather has been getting warmer and has been spending more time outdoors. Using albuterol sometimes with good benefit.   Today's spirometry was normal.   Daily controller medication(s): ? Restart Symbicort 160 1 puff twice a day with spacer and rinse mouth afterwards.  We will continue this until your next visit.  ? Continue Singulair 10mg  daily.  ? Continue Spiriva 1.8mcg respimat 2 puffs daily.  Prior to physical activity:May use albuterol rescue inhaler 2 puffs 5 to 15 minutes prior to strenuous physical activities.  Rescue medications:May use albuterol rescue inhaler 2 puffs or nebulizer every 4 to 6 hours as needed for shortness of breath, chest tightness, coughing, and wheezing. Monitor frequency of use.  Seasonal and perennial allergic rhinoconjunctivitis Past history - 2020 skin testing was positive to ragweed, mold grass, trees.  Started AIT on 07/03/2019 (G-RW-T and Mold). Interim history - some increased symptoms with the warmer weather.   Stop Flonase.  Start Xhance 2 sprays per nostril twice a day. Sample given and demonstrated proper use.   Nasal saline spray (i.e., Simply Saline) or nasal saline lavage (i.e., NeilMed) is recommended as needed and prior to medicated nasal sprays.  Continue environmental  control measures.   Continue zyrtec (cetirizine) 10mg  daily and may take it twice a day if needed for allergy flares.   Continue Singulair 10mg  daily at night.  May use Pazeo 1 drop in each eye as needed for itchy/watery eyes. Use it before mowing the lawn.  Recommend getting annual eye exam.  Continue allergy injections.  Return in about 4 months (around 07/01/2020).  Assessment and Plan: [name] is a 59 y.o. male with: No problem-specific Assessment & Plan notes found for this encounter.  No follow-ups on file.  No orders of the defined types were placed in this encounter.  Lab Orders  No laboratory test(s) ordered today    Diagnostics: Spirometry:  Tracings reviewed. His effort: {Blank single:19197::"Good reproducible efforts.","It was hard to get consistent efforts and there is a question as to whether this reflects a maximal maneuver.","Poor effort, data can not be interpreted."} FVC: ***L FEV1: ***L, ***% predicted FEV1/FVC ratio: ***% Interpretation: {Blank single:19197::"Spirometry consistent with mild obstructive disease","Spirometry consistent with moderate obstructive disease","Spirometry consistent with severe obstructive disease","Spirometry consistent with possible restrictive disease","Spirometry consistent with mixed obstructive and restrictive disease","Spirometry uninterpretable due to technique","Spirometry consistent with normal pattern","No overt abnormalities noted given today's efforts"}.  Please see scanned spirometry results for details.  Skin Testing: {Blank single:19197::"Select foods","Environmental allergy panel","Environmental allergy panel and select foods","Food allergy panel","None","Deferred due to recent antihistamines use"}. Positive test to: ***. Negative test to: ***.  Results discussed with patient/family.   Medication List:  Current Outpatient Medications  Medication Sig Dispense Refill  . ALBUTEROL IN Inhale 2 puffs into the lungs as  needed.    Marland Kitchen atorvastatin (LIPITOR) 80 MG tablet Take 80 mg  by mouth daily.    . cetirizine (ZYRTEC) 10 MG tablet Take 10 mg by mouth daily.    . citalopram (CELEXA) 40 MG tablet Take 40 mg by mouth daily.    . empagliflozin (JARDIANCE) 25 MG TABS tablet Take 25 mg by mouth daily.    Marland Kitchen EPINEPHrine (EPIPEN 2-PAK) 0.3 mg/0.3 mL IJ SOAJ injection Inject 0.3 mLs (0.3 mg total) into the muscle as needed for anaphylaxis. 1 Device 2  . Fluticasone Propionate (XHANCE) 93 MCG/ACT EXHU Place 2 sprays into the nose in the morning and at bedtime. 32 mL 11  . gabapentin (NEURONTIN) 600 MG tablet Take 600 mg by mouth 2 (two) times daily.     Marland Kitchen glucagon (GLUCAGEN) 1 MG SOLR injection Inject 1 mg into the vein once as needed for low blood sugar.    . hydrochlorothiazide (HYDRODIURIL) 25 MG tablet Take 25 mg by mouth daily.    . insulin aspart protamine- aspart (NOVOLOG MIX 70/30) (70-30) 100 UNIT/ML injection Inject 62 Units into the skin 2 (two) times daily with a meal.    . Insulin Syringe-Needle U-100 (INSULIN SYRINGE .3CC/31GX5/16") 31G X 5/16" 0.3 ML MISC by Does not apply route.    Marland Kitchen ketotifen (ZADITOR) 0.025 % ophthalmic solution Place 1 drop into both eyes 2 (two) times daily.     . Lancets (ONETOUCH ULTRASOFT) lancets Use as instructed 100 each 2  . levothyroxine (SYNTHROID) 75 MCG tablet Take 1 tablet (75 mcg total) by mouth daily. 30 tablet 3  . Lidocaine HCl (ASPERCREME LIDOCAINE) 4 % LIQD Apply 1 application topically daily as needed.    Marland Kitchen losartan (COZAAR) 25 MG tablet Take 100 mg by mouth daily.     . metFORMIN (GLUCOPHAGE-XR) 500 MG 24 hr tablet Take 1,000 mg by mouth 2 (two) times daily.     . montelukast (SINGULAIR) 10 MG tablet Take 10 mg by mouth at bedtime.    . NON FORMULARY Allergen immunotherapy    . Olopatadine HCl (PAZEO) 0.7 % SOLN Apply 1 drop to eye daily as needed (itchy/watery eyes). 1 Bottle 3  . ondansetron (ZOFRAN ODT) 4 MG disintegrating tablet Take 1 tablet (4 mg total) by  mouth every 8 (eight) hours as needed for nausea or vomiting. 10 tablet 0  . ondansetron (ZOFRAN) 4 MG tablet Take 1 tablet (4 mg total) by mouth every 6 (six) hours as needed for nausea or vomiting. 30 tablet 1  . ONE TOUCH ULTRA TEST test strip USE AS INSTRUCTED TO CHECK BLOOD SUGAR THREE TIMES DAILY. 100 each 5  . pantoprazole (PROTONIX) 40 MG tablet Take 40 mg by mouth daily.    . pioglitazone (ACTOS) 45 MG tablet Take 45 mg by mouth daily.    . Semaglutide,0.25 or 0.5MG /DOS, (OZEMPIC, 0.25 OR 0.5 MG/DOSE,) 2 MG/1.5ML SOPN Inject into the skin.    . tamsulosin (FLOMAX) 0.4 MG CAPS capsule TAKE 1 CAPSULE (0.4 MG TOTAL) BY MOUTH DAILY. 30 capsule 5  . testosterone cypionate (DEPOTESTOSTERONE CYPIONATE) 200 MG/ML injection Inject 200 mg into the muscle.    . Tiotropium Bromide Monohydrate (SPIRIVA RESPIMAT) 1.25 MCG/ACT AERS Inhale 2 puffs into the lungs daily. 1 Inhaler 3  . urea (CARMOL) 10 % cream Apply topically 3 (three) times daily.     No current facility-administered medications for this visit.   Allergies: Allergies  Allergen Reactions  . Hydrocodone Itching  . Niacin And Related Hives   I reviewed his past medical history, social history, family history, and environmental  history and no significant changes have been reported from his previous visit.  Review of Systems  Constitutional: Negative for appetite change, chills, fever and unexpected weight change.  HENT: Positive for congestion. Negative for rhinorrhea.   Eyes: Positive for itching.  Respiratory: Negative for cough, chest tightness, shortness of breath and wheezing.   Gastrointestinal: Negative for abdominal pain.  Skin: Negative for rash.  Allergic/Immunologic: Positive for environmental allergies.  Neurological: Negative for headaches.   Objective: There were no vitals taken for this visit. There is no height or weight on file to calculate BMI. Physical Exam Vitals and nursing note reviewed.  Constitutional:       Appearance: He is well-developed.  HENT:     Head: Normocephalic and atraumatic.     Right Ear: External ear normal.     Left Ear: External ear normal.  Eyes:     Conjunctiva/sclera: Conjunctivae normal.  Cardiovascular:     Rate and Rhythm: Normal rate and regular rhythm.     Heart sounds: Normal heart sounds. No murmur heard.  No friction rub. No gallop.   Pulmonary:     Effort: Pulmonary effort is normal.     Breath sounds: Normal breath sounds. No wheezing or rales.  Musculoskeletal:     Cervical back: Neck supple.  Skin:    General: Skin is warm.     Findings: No rash.  Neurological:     Mental Status: He is alert and oriented to person, place, and time.  Psychiatric:        Behavior: Behavior normal.    Previous notes and tests were reviewed. The plan was reviewed with the patient/family, and all questions/concerned were addressed.  It was my pleasure to see [name] today and participate in his care. Please feel free to contact me with any questions or concerns.  Sincerely,  Rexene Alberts, DO Allergy & Immunology  [facility] office: [phone] [facility] office: [place] office: [phone]

## 2020-07-05 ENCOUNTER — Ambulatory Visit: Payer: Medicare Other | Admitting: Allergy

## 2020-07-22 ENCOUNTER — Ambulatory Visit (INDEPENDENT_AMBULATORY_CARE_PROVIDER_SITE_OTHER): Payer: No Typology Code available for payment source

## 2020-07-22 DIAGNOSIS — J309 Allergic rhinitis, unspecified: Secondary | ICD-10-CM | POA: Diagnosis not present

## 2020-08-06 ENCOUNTER — Ambulatory Visit (INDEPENDENT_AMBULATORY_CARE_PROVIDER_SITE_OTHER): Payer: No Typology Code available for payment source

## 2020-08-06 DIAGNOSIS — J309 Allergic rhinitis, unspecified: Secondary | ICD-10-CM

## 2020-08-23 ENCOUNTER — Ambulatory Visit (INDEPENDENT_AMBULATORY_CARE_PROVIDER_SITE_OTHER): Payer: No Typology Code available for payment source

## 2020-08-23 DIAGNOSIS — J309 Allergic rhinitis, unspecified: Secondary | ICD-10-CM | POA: Diagnosis not present

## 2020-08-30 ENCOUNTER — Ambulatory Visit (INDEPENDENT_AMBULATORY_CARE_PROVIDER_SITE_OTHER): Payer: No Typology Code available for payment source | Admitting: *Deleted

## 2020-08-30 DIAGNOSIS — J309 Allergic rhinitis, unspecified: Secondary | ICD-10-CM

## 2020-09-05 NOTE — Progress Notes (Deleted)
Follow Up Note  RE: [name] MRN: [id number] DOB: 01/18/1961 Date of Office Visit: 09/06/2020  Referring provider: [name], NP Primary care provider: [name], NP  Chief Complaint: No chief complaint on file.  History of Present Illness: I had the pleasure of seeing [name] for a follow up visit at the [facility] on 09/05/2020. He is a 59 y.o. male, who is being followed for asthma and allergic rhino conjunctivitis on AIT. His previous allergy office visit was on 03/01/2020 with Dr. Maudie Mercury. Today is a regular follow up visit.  Moderate persistent asthma without complication Was doing well until the weather has been getting warmer and has been spending more time outdoors. Using albuterol sometimes with good benefit.   Today's spirometry was normal.   Daily controller medication(s): ? Restart Symbicort 160 1 puff twice a day with spacer and rinse mouth afterwards.  We will continue this until your next visit.  ? Continue Singulair 10mg  daily.  ? Continue Spiriva 1.71mcg respimat 2 puffs daily.  Prior to physical activity:May use albuterol rescue inhaler 2 puffs 5 to 15 minutes prior to strenuous physical activities.  Rescue medications:May use albuterol rescue inhaler 2 puffs or nebulizer every 4 to 6 hours as needed for shortness of breath, chest tightness, coughing, and wheezing. Monitor frequency of use.  Seasonal and perennial allergic rhinoconjunctivitis Past history - 2020 skin testing was positive to ragweed, mold grass, trees.  Started AIT on 07/03/2019 (G-RW-T and Mold). Interim history - some increased symptoms with the warmer weather.   Stop Flonase.  Start Xhance 2 sprays per nostril twice a day. Sample given and demonstrated proper use.   Nasal saline spray (i.e., Simply Saline) or nasal saline lavage (i.e., NeilMed) is recommended as needed and prior to medicated nasal sprays.  Continue environmental  control measures.   Continue zyrtec (cetirizine) 10mg  daily and may take it twice a day if needed for allergy flares.   Continue Singulair 10mg  daily at night.  May use Pazeo 1 drop in each eye as needed for itchy/watery eyes. Use it before mowing the lawn.  Recommend getting annual eye exam.  Continue allergy injections.  Return in about 4 months (around 07/01/2020).  Assessment and Plan: [name] is a 59 y.o. male with: No problem-specific Assessment & Plan notes found for this encounter.  No follow-ups on file.  No orders of the defined types were placed in this encounter.  Lab Orders  No laboratory test(s) ordered today    Diagnostics: Spirometry:  Tracings reviewed. His effort: {Blank single:19197::"Good reproducible efforts.","It was hard to get consistent efforts and there is a question as to whether this reflects a maximal maneuver.","Poor effort, data can not be interpreted."} FVC: ***L FEV1: ***L, ***% predicted FEV1/FVC ratio: ***% Interpretation: {Blank single:19197::"Spirometry consistent with mild obstructive disease","Spirometry consistent with moderate obstructive disease","Spirometry consistent with severe obstructive disease","Spirometry consistent with possible restrictive disease","Spirometry consistent with mixed obstructive and restrictive disease","Spirometry uninterpretable due to technique","Spirometry consistent with normal pattern","No overt abnormalities noted given today's efforts"}.  Please see scanned spirometry results for details.  Skin Testing: {Blank single:19197::"Select foods","Environmental allergy panel","Environmental allergy panel and select foods","Food allergy panel","None","Deferred due to recent antihistamines use"}. Positive test to: ***. Negative test to: ***.  Results discussed with patient/family.   Medication List:  Current Outpatient Medications  Medication Sig Dispense Refill  . ALBUTEROL IN Inhale 2 puffs into the lungs as  needed.    Marland Kitchen atorvastatin (LIPITOR) 80 MG tablet Take  80 mg by mouth daily.    . cetirizine (ZYRTEC) 10 MG tablet Take 10 mg by mouth daily.    . citalopram (CELEXA) 40 MG tablet Take 40 mg by mouth daily.    . empagliflozin (JARDIANCE) 25 MG TABS tablet Take 25 mg by mouth daily.    Marland Kitchen EPINEPHrine (EPIPEN 2-PAK) 0.3 mg/0.3 mL IJ SOAJ injection Inject 0.3 mLs (0.3 mg total) into the muscle as needed for anaphylaxis. 1 Device 2  . Fluticasone Propionate (XHANCE) 93 MCG/ACT EXHU Place 2 sprays into the nose in the morning and at bedtime. 32 mL 11  . gabapentin (NEURONTIN) 600 MG tablet Take 600 mg by mouth 2 (two) times daily.     Marland Kitchen glucagon (GLUCAGEN) 1 MG SOLR injection Inject 1 mg into the vein once as needed for low blood sugar.    . hydrochlorothiazide (HYDRODIURIL) 25 MG tablet Take 25 mg by mouth daily.    . insulin aspart protamine- aspart (NOVOLOG MIX 70/30) (70-30) 100 UNIT/ML injection Inject 62 Units into the skin 2 (two) times daily with a meal.    . Insulin Syringe-Needle U-100 (INSULIN SYRINGE .3CC/31GX5/16") 31G X 5/16" 0.3 ML MISC by Does not apply route.    Marland Kitchen ketotifen (ZADITOR) 0.025 % ophthalmic solution Place 1 drop into both eyes 2 (two) times daily.     . Lancets (ONETOUCH ULTRASOFT) lancets Use as instructed 100 each 2  . levothyroxine (SYNTHROID) 75 MCG tablet Take 1 tablet (75 mcg total) by mouth daily. 30 tablet 3  . Lidocaine HCl (ASPERCREME LIDOCAINE) 4 % LIQD Apply 1 application topically daily as needed.    Marland Kitchen losartan (COZAAR) 25 MG tablet Take 100 mg by mouth daily.     . metFORMIN (GLUCOPHAGE-XR) 500 MG 24 hr tablet Take 1,000 mg by mouth 2 (two) times daily.     . montelukast (SINGULAIR) 10 MG tablet Take 10 mg by mouth at bedtime.    . NON FORMULARY Allergen immunotherapy    . Olopatadine HCl (PAZEO) 0.7 % SOLN Apply 1 drop to eye daily as needed (itchy/watery eyes). 1 Bottle 3  . ondansetron (ZOFRAN ODT) 4 MG disintegrating tablet Take 1 tablet (4 mg total) by  mouth every 8 (eight) hours as needed for nausea or vomiting. 10 tablet 0  . ondansetron (ZOFRAN) 4 MG tablet Take 1 tablet (4 mg total) by mouth every 6 (six) hours as needed for nausea or vomiting. 30 tablet 1  . ONE TOUCH ULTRA TEST test strip USE AS INSTRUCTED TO CHECK BLOOD SUGAR THREE TIMES DAILY. 100 each 5  . pantoprazole (PROTONIX) 40 MG tablet Take 40 mg by mouth daily.    . pioglitazone (ACTOS) 45 MG tablet Take 45 mg by mouth daily.    . Semaglutide,0.25 or 0.5MG /DOS, (OZEMPIC, 0.25 OR 0.5 MG/DOSE,) 2 MG/1.5ML SOPN Inject into the skin.    . tamsulosin (FLOMAX) 0.4 MG CAPS capsule TAKE 1 CAPSULE (0.4 MG TOTAL) BY MOUTH DAILY. 30 capsule 5  . testosterone cypionate (DEPOTESTOSTERONE CYPIONATE) 200 MG/ML injection Inject 200 mg into the muscle.    . Tiotropium Bromide Monohydrate (SPIRIVA RESPIMAT) 1.25 MCG/ACT AERS Inhale 2 puffs into the lungs daily. 1 Inhaler 3  . urea (CARMOL) 10 % cream Apply topically 3 (three) times daily.     No current facility-administered medications for this visit.   Allergies: Allergies  Allergen Reactions  . Hydrocodone Itching  . Niacin And Related Hives   I reviewed his past medical history, social history, family history,  and environmental history and no significant changes have been reported from his previous visit.  Review of Systems  Constitutional: Negative for appetite change, chills, fever and unexpected weight change.  HENT: Positive for congestion. Negative for rhinorrhea.   Eyes: Positive for itching.  Respiratory: Negative for cough, chest tightness, shortness of breath and wheezing.   Gastrointestinal: Negative for abdominal pain.  Skin: Negative for rash.  Allergic/Immunologic: Positive for environmental allergies.  Neurological: Negative for headaches.   Objective: There were no vitals taken for this visit. There is no height or weight on file to calculate BMI. Physical Exam Vitals and nursing note reviewed.  Constitutional:       Appearance: He is well-developed.  HENT:     Head: Normocephalic and atraumatic.     Right Ear: External ear normal.     Left Ear: External ear normal.     Nose: Nose normal.     Mouth/Throat:     Mouth: Mucous membranes are moist.     Pharynx: Oropharynx is clear.  Eyes:     Conjunctiva/sclera: Conjunctivae normal.  Cardiovascular:     Rate and Rhythm: Normal rate and regular rhythm.     Heart sounds: Normal heart sounds. No murmur heard.  No friction rub. No gallop.   Pulmonary:     Effort: Pulmonary effort is normal.     Breath sounds: Normal breath sounds. No wheezing or rales.  Musculoskeletal:     Cervical back: Neck supple.  Skin:    General: Skin is warm.     Findings: No rash.  Neurological:     Mental Status: He is alert and oriented to person, place, and time.  Psychiatric:        Behavior: Behavior normal.    Previous notes and tests were reviewed. The plan was reviewed with the patient/family, and all questions/concerned were addressed.  It was my pleasure to see [name] today and participate in his care. Please feel free to contact me with any questions or concerns.  Sincerely,  Rexene Alberts, DO Allergy & Immunology  [facility] office: [phone] [facility] office: [place] office: [phone]

## 2020-09-06 ENCOUNTER — Ambulatory Visit (INDEPENDENT_AMBULATORY_CARE_PROVIDER_SITE_OTHER): Payer: No Typology Code available for payment source | Admitting: *Deleted

## 2020-09-06 ENCOUNTER — Ambulatory Visit: Payer: No Typology Code available for payment source | Admitting: Allergy

## 2020-09-06 DIAGNOSIS — J309 Allergic rhinitis, unspecified: Secondary | ICD-10-CM | POA: Diagnosis not present

## 2020-09-16 ENCOUNTER — Ambulatory Visit (INDEPENDENT_AMBULATORY_CARE_PROVIDER_SITE_OTHER): Payer: No Typology Code available for payment source | Admitting: *Deleted

## 2020-09-16 DIAGNOSIS — J309 Allergic rhinitis, unspecified: Secondary | ICD-10-CM | POA: Diagnosis not present

## 2020-09-18 ENCOUNTER — Ambulatory Visit (INDEPENDENT_AMBULATORY_CARE_PROVIDER_SITE_OTHER): Payer: No Typology Code available for payment source | Admitting: Family Medicine

## 2020-09-18 ENCOUNTER — Encounter: Payer: Self-pay | Admitting: Family Medicine

## 2020-09-18 ENCOUNTER — Other Ambulatory Visit: Payer: Self-pay

## 2020-09-18 VITALS — BP 138/80 | HR 82 | Temp 98.4°F | Resp 16 | Ht 64.0 in | Wt 217.4 lb

## 2020-09-18 DIAGNOSIS — J4540 Moderate persistent asthma, uncomplicated: Secondary | ICD-10-CM | POA: Diagnosis not present

## 2020-09-18 DIAGNOSIS — J302 Other seasonal allergic rhinitis: Secondary | ICD-10-CM | POA: Diagnosis not present

## 2020-09-18 DIAGNOSIS — J3089 Other allergic rhinitis: Secondary | ICD-10-CM

## 2020-09-18 DIAGNOSIS — H1010 Acute atopic conjunctivitis, unspecified eye: Secondary | ICD-10-CM | POA: Diagnosis not present

## 2020-09-18 NOTE — Progress Notes (Addendum)
[address] [place] [address] Dept: [phone]  FOLLOW UP NOTE  Patient ID: [name], male    DOB: Dec 03, 1961  Age: 59 y.o. MRN: [id number] Date of Office Visit: 09/18/2020  Assessment  Chief Complaint: Allergies and Asthma  HPI [name] is a 59 year old male who presents to the clinic for follow-up visit.  He was last seen in this clinic on 03/01/2020 by Dr. Maudie Mercury for evaluation of asthma, allergic rhinitis on allergen immunotherapy, and allergic conjunctivitis.  At today's visit he reports his asthma has been well controlled with shortness of breath occurring when exposed to grass clippings and intermittent cough producing clear mucus.  He is currently taking montelukast 10 mg once a day Symbicort 2 puffs once a day with a spacer, and Spiriva 2 puffs once a day.  Allergic rhinitis is reported as moderately well controlled with symptoms including clear rhinorrhea, nasal congestion, postnasal drainage, and sneezing for which he uses Flonase nasal spray daily, cetirizine 10 mg once a day, and occasional nasal saline rinses.  He continues allergen immunotherapy with no large local reactions.  He reports that while continuing allergen immunotherapy he has noticed a significant decrease in his symptoms of allergic rhinitis.  Allergic conjunctivitis is reported as moderately well controlled with red itchy eyes occurring after working with the grass and grass clippings for which he uses Pazeo as needed which provides relief of symptoms.   Drug Allergies:  Allergies  Allergen Reactions  . Hydrocodone Itching  . Niacin And Related Hives    Physical Exam: BP 138/80   Pulse 82   Temp 98.4 F (36.9 C)   Resp 16   Ht 5\' 4"  (1.626 m)   Wt 217 lb 6.4 oz (98.6 kg)   SpO2 96%   BMI 37.32 kg/m    Physical Exam Vitals reviewed.  Constitutional:      Appearance: Normal appearance.  HENT:     Head: Normocephalic and atraumatic.     Right Ear: Tympanic membrane  normal.     Left Ear: Tympanic membrane normal.     Nose:     Comments: Bilateral nares slightly erythematous with clear nasal drainage noted.  Pharynx normal.  Ears normal.  Eyes normal.    Mouth/Throat:     Pharynx: Oropharynx is clear.  Eyes:     Conjunctiva/sclera: Conjunctivae normal.  Cardiovascular:     Rate and Rhythm: Normal rate and regular rhythm.     Heart sounds: Normal heart sounds. No murmur heard.   Pulmonary:     Effort: Pulmonary effort is normal.     Breath sounds: Normal breath sounds.     Comments: Lungs clear to auscultation Musculoskeletal:        General: Normal range of motion.     Cervical back: Normal range of motion and neck supple.  Skin:    General: Skin is warm and dry.  Neurological:     Mental Status: He is alert and oriented to person, place, and time.  Psychiatric:        Mood and Affect: Mood normal.        Behavior: Behavior normal.        Thought Content: Thought content normal.        Judgment: Judgment normal.     Diagnostics: FVC 3.25, FEV1 2.64.  Predicted FVC 3.31.  Predicted FEV1 2.64.  Spirometry indicates normal ventilatory function.  Assessment and Plan: 1. Moderate persistent asthma without complication   2. Seasonal  and perennial allergic rhinoconjunctivitis   3. Seasonal allergic conjunctivitis      Patient Instructions  Asthma Continue Symbicort 1 puff once a day with a spacer to prevent cough or wheeze Continue Spiriva 1.25 2 puffs once a day to prevent cough or wheeze Continue montelukast 10 mg once a day to prevent cough or wheeze Continue albuterol 2 puffs every 4 hours as needed for cough or wheeze For asthma flare increase Symbicort to 2 puffs twice a day with a spacer for 2 weeks or until cough and wheeze free  Allergic rhinitis Continue avoidance measures directed toward pollens and mold as listed below Continue allergen immunotherapy and have access to an epinephrine auto-injector set Continue cetirizine  10 mg once a day as needed for a runny nose Continue Flonase 2 sprays in each nostril once a day for nasal congestion.  In the right nostril, point the applicator out toward the right ear. In the left nostril, point the applicator out toward the left ear Continue saline nasal rinses as needed for nasal symptoms. Use this before any medicated nasal sprays for best result  Allergic conjunctivitis Continue Pazeo one drop in each eye once a day as needed for red, itchy eyes.   Call the clinic if this treatment plan is not working well for you  Follow up in 6 months or sooner if needed.   Return in about 6 months (around 03/18/2021), or if symptoms worsen or fail to improve.    Thank you for the opportunity to care for this patient.  Please do not hesitate to contact me with questions.  Gareth Morgan, FNP [facility]  ________________________________________________  I have provided oversight concerning [facility]'s evaluation and treatment of this patient's health issues addressed during today's encounter.  I agree with the assessment and therapeutic plan as outlined in the note.   Signed,   R Edgar Frisk, MD

## 2020-09-18 NOTE — Patient Instructions (Signed)
Asthma Continue Symbicort 1 puff once a day with a spacer to prevent cough or wheeze Continue Spiriva 1.25 2 puffs once a day to prevent cough or wheeze Continue montelukast 10 mg once a day to prevent cough or wheeze Continue albuterol 2 puffs every 4 hours as needed for cough or wheeze For asthma flare increase Symbicort to 2 puffs twice a day with a spacer for 2 weeks or until cough and wheeze free  Allergic rhinitis Continue avoidance measures directed toward pollens and mold as listed below Continue allergen immunotherapy and have access to an epinephrine auto-injector set Continue cetirizine 10 mg once a day as needed for a runny nose Continue Flonase 2 sprays in each nostril once a day for nasal congestion.  In the right nostril, point the applicator out toward the right ear. In the left nostril, point the applicator out toward the left ear Continue saline nasal rinses as needed for nasal symptoms. Use this before any medicated nasal sprays for best result  Allergic conjunctivitis Continue Pazeo one drop in each eye once a day as needed for red, itchy eyes.   Call the clinic if this treatment plan is not working well for you  Follow up in 6 months or sooner if needed.  Reducing Pollen Exposure The American Academy of Allergy, Asthma and Immunology suggests the following steps to reduce your exposure to pollen during allergy seasons. 1. Do not hang sheets or clothing out to dry; pollen may collect on these items. 2. Do not mow lawns or spend time around freshly cut grass; mowing stirs up pollen. 3. Keep windows closed at night.  Keep car windows closed while driving. 4. Minimize morning activities outdoors, a time when pollen counts are usually at their highest. 5. Stay indoors as much as possible when pollen counts or humidity is high and on windy days when pollen tends to remain in the air longer. 6. Use air conditioning when possible.  Many air conditioners have filters that trap  the pollen spores. 7. Use a HEPA room air filter to remove pollen form the indoor air you breathe.  Control of Mold Allergen Mold and fungi can grow on a variety of surfaces provided certain temperature and moisture conditions exist.  Outdoor molds grow on plants, decaying vegetation and soil.  The major outdoor mold, Alternaria and Cladosporium, are found in very high numbers during hot and dry conditions.  Generally, a late Summer - Fall peak is seen for common outdoor fungal spores.  Rain will temporarily lower outdoor mold spore count, but counts rise rapidly when the rainy period ends.  The most important indoor molds are Aspergillus and Penicillium.  Dark, humid and poorly ventilated basements are ideal sites for mold growth.  The next most common sites of mold growth are the bathroom and the kitchen.  Outdoor [employer] 8. Use air conditioning and keep windows closed 9. Avoid exposure to decaying vegetation. 10. Avoid leaf raking. 11. Avoid grain handling. 12. Consider wearing a face mask if working in moldy areas.  Indoor Mold Control 1. Maintain humidity below 50%. 2. Clean washable surfaces with 5% bleach solution. 3. Remove sources e.g. Contaminated carpets.

## 2020-09-26 ENCOUNTER — Ambulatory Visit (INDEPENDENT_AMBULATORY_CARE_PROVIDER_SITE_OTHER): Payer: No Typology Code available for payment source

## 2020-09-26 DIAGNOSIS — J309 Allergic rhinitis, unspecified: Secondary | ICD-10-CM | POA: Diagnosis not present

## 2020-10-03 ENCOUNTER — Ambulatory Visit (INDEPENDENT_AMBULATORY_CARE_PROVIDER_SITE_OTHER): Payer: No Typology Code available for payment source

## 2020-10-03 DIAGNOSIS — J309 Allergic rhinitis, unspecified: Secondary | ICD-10-CM

## 2020-10-08 DIAGNOSIS — J301 Allergic rhinitis due to pollen: Secondary | ICD-10-CM | POA: Diagnosis not present

## 2020-10-08 NOTE — Progress Notes (Signed)
Vials exp 10-08-21

## 2020-10-09 DIAGNOSIS — J302 Other seasonal allergic rhinitis: Secondary | ICD-10-CM

## 2020-10-17 ENCOUNTER — Ambulatory Visit (INDEPENDENT_AMBULATORY_CARE_PROVIDER_SITE_OTHER): Payer: No Typology Code available for payment source

## 2020-10-17 DIAGNOSIS — J309 Allergic rhinitis, unspecified: Secondary | ICD-10-CM | POA: Diagnosis not present

## 2020-10-25 ENCOUNTER — Ambulatory Visit (INDEPENDENT_AMBULATORY_CARE_PROVIDER_SITE_OTHER): Payer: No Typology Code available for payment source

## 2020-10-25 DIAGNOSIS — J309 Allergic rhinitis, unspecified: Secondary | ICD-10-CM | POA: Diagnosis not present

## 2020-10-31 ENCOUNTER — Ambulatory Visit (INDEPENDENT_AMBULATORY_CARE_PROVIDER_SITE_OTHER): Payer: No Typology Code available for payment source

## 2020-10-31 DIAGNOSIS — J309 Allergic rhinitis, unspecified: Secondary | ICD-10-CM

## 2020-11-07 ENCOUNTER — Ambulatory Visit (INDEPENDENT_AMBULATORY_CARE_PROVIDER_SITE_OTHER): Payer: No Typology Code available for payment source

## 2020-11-07 DIAGNOSIS — J309 Allergic rhinitis, unspecified: Secondary | ICD-10-CM

## 2020-11-15 ENCOUNTER — Ambulatory Visit (INDEPENDENT_AMBULATORY_CARE_PROVIDER_SITE_OTHER): Payer: No Typology Code available for payment source | Admitting: *Deleted

## 2020-11-15 DIAGNOSIS — J309 Allergic rhinitis, unspecified: Secondary | ICD-10-CM

## 2020-11-26 ENCOUNTER — Ambulatory Visit (INDEPENDENT_AMBULATORY_CARE_PROVIDER_SITE_OTHER): Payer: No Typology Code available for payment source

## 2020-11-26 DIAGNOSIS — J309 Allergic rhinitis, unspecified: Secondary | ICD-10-CM

## 2020-12-04 ENCOUNTER — Ambulatory Visit (INDEPENDENT_AMBULATORY_CARE_PROVIDER_SITE_OTHER): Payer: No Typology Code available for payment source

## 2020-12-04 DIAGNOSIS — J309 Allergic rhinitis, unspecified: Secondary | ICD-10-CM | POA: Diagnosis not present

## 2020-12-18 ENCOUNTER — Ambulatory Visit (INDEPENDENT_AMBULATORY_CARE_PROVIDER_SITE_OTHER): Payer: No Typology Code available for payment source

## 2020-12-18 DIAGNOSIS — J309 Allergic rhinitis, unspecified: Secondary | ICD-10-CM | POA: Diagnosis not present

## 2020-12-24 ENCOUNTER — Telehealth: Payer: Self-pay | Admitting: Family Medicine

## 2020-12-24 NOTE — Telephone Encounter (Signed)
Sent new request for [facility] Authorization to [facility] to fax, [phone].

## 2020-12-30 ENCOUNTER — Ambulatory Visit (INDEPENDENT_AMBULATORY_CARE_PROVIDER_SITE_OTHER): Payer: No Typology Code available for payment source

## 2020-12-30 DIAGNOSIS — J309 Allergic rhinitis, unspecified: Secondary | ICD-10-CM

## 2021-01-16 ENCOUNTER — Ambulatory Visit (INDEPENDENT_AMBULATORY_CARE_PROVIDER_SITE_OTHER): Payer: No Typology Code available for payment source

## 2021-01-16 DIAGNOSIS — J309 Allergic rhinitis, unspecified: Secondary | ICD-10-CM

## 2021-01-28 ENCOUNTER — Encounter: Payer: Self-pay | Admitting: Family

## 2021-01-28 ENCOUNTER — Ambulatory Visit (INDEPENDENT_AMBULATORY_CARE_PROVIDER_SITE_OTHER): Payer: Medicare Other | Admitting: Family

## 2021-01-28 ENCOUNTER — Other Ambulatory Visit: Payer: Self-pay

## 2021-01-28 ENCOUNTER — Telehealth: Payer: Self-pay | Admitting: Family

## 2021-01-28 VITALS — BP 131/79 | HR 87 | Temp 98.3°F | Resp 16 | Ht 64.0 in | Wt 214.0 lb

## 2021-01-28 DIAGNOSIS — N400 Enlarged prostate without lower urinary tract symptoms: Secondary | ICD-10-CM

## 2021-01-28 DIAGNOSIS — G2581 Restless legs syndrome: Secondary | ICD-10-CM | POA: Insufficient documentation

## 2021-01-28 DIAGNOSIS — N529 Male erectile dysfunction, unspecified: Secondary | ICD-10-CM

## 2021-01-28 DIAGNOSIS — E119 Type 2 diabetes mellitus without complications: Secondary | ICD-10-CM | POA: Diagnosis not present

## 2021-01-28 DIAGNOSIS — C642 Malignant neoplasm of left kidney, except renal pelvis: Secondary | ICD-10-CM

## 2021-01-28 DIAGNOSIS — J45909 Unspecified asthma, uncomplicated: Secondary | ICD-10-CM

## 2021-01-28 DIAGNOSIS — E785 Hyperlipidemia, unspecified: Secondary | ICD-10-CM | POA: Diagnosis not present

## 2021-01-28 DIAGNOSIS — F32A Depression, unspecified: Secondary | ICD-10-CM | POA: Diagnosis not present

## 2021-01-28 LAB — LIPID PANEL
Cholesterol: 144 mg/dL (ref 0–200)
HDL: 34.3 mg/dL — ABNORMAL LOW (ref >39.00)
LDL Cholesterol: 78 mg/dL (ref 0–99)
NonHDL: 110.06
Total CHOL/HDL Ratio: 4
Triglycerides: 159 mg/dL — ABNORMAL HIGH (ref 0.0–149.0)
VLDL: 31.8 mg/dL (ref 0.0–40.0)

## 2021-01-28 MED ORDER — FLUTICASONE-SALMETEROL 250-50 MCG/DOSE IN AEPB: 1.0000 | INHALATION_SPRAY | Freq: Two times a day (BID) | RESPIRATORY_TRACT | 3 refills | Status: DC

## 2021-01-28 NOTE — Patient Instructions (Signed)
Please decrease your insulin from 30units twice daily to 28 units twice daily.  Don't skip lunch. You will be contacted about your referral to endocrinology.  Complete lab work prior to leaving.

## 2021-01-28 NOTE — Progress Notes (Signed)
Subjective:    Patient ID: [name], male    DOB: Jan 14, 1961, 60 y.o.   MRN: [id number]  HPI  Patient presents today for complete physical.  Hyperlipidemia- maintained on 80mg  of atorvastatin Lab Results  Component Value Date   CHOL 160 02/14/2019   HDL 36.00 (L) 02/14/2019   LDLCALC 88 02/14/2019   TRIG 180.0 (H) 02/14/2019   CHOLHDL 4 02/14/2019   DM2- followed by the [place]. Reports that he had a glucose of 29 while driving. Patient pulled over and took some glucagon. He then contacted the [place] and was told to stop actos by Endocrinology. His endocrinologist retired at the [place] 8 months ago, but he has not been reassiben  Reports he will go long periods without eating.  A1C 7.8 on 01/23/21 He takes ozempic 0.5mg  weekly, metformin 1000g bid and jardiance 25mg . He reports that since he stopped the actos he has continued to have some hypoglycemia with a low of 45.    Lab Results  Component Value Date   HGBA1C 8.1 (H) 02/14/2019   HGBA1C 8.2 (H) 02/05/2017   HGBA1C 7.6 (H) 12/10/2015   Lab Results  Component Value Date   MICROALBUR 22.5 (H) 12/10/2015   LDLCALC 88 02/14/2019   CREATININE 1.23 04/05/2020   Currently taking novolog 70/30 only taking 30 units BID. Was previously prescribed.    Hypothyroid-  Lab Results  Component Value Date   TSH 2.52 09/06/2019   GERD- maintained on protonix.  ED- maintained on cialis prn with good response.    Hypogonadism- was being given by [facility]ocrinology. [facility] is providing [facility]jections.   Depression- mood is good on citalopram 40mg .    HTN- hctz 25mg  and losartan 25mg .    BP Readings from Last 3 Encounters:  01/28/21 131/79  09/18/20 138/80  04/09/20 (!) 148/86   Asthma- working with Dr. Gwenette Greet at the [facility] and Dr. Maudie Mercury (allergist).  Reports that his breathing has been stable.  (on spiriva) not using xhance due to cost, singulair 10mg .   RLS- jerking his legs a lot in his sleep and was placed on  mirapex.   He brings with him today his lab work from the [place] which I have reviewed and will be scanned into his medical record.   Review of Systems   See HPI  Past Medical History:  Diagnosis Date  . Arthritis   . Asthma   . Cancer of kidney ([place])    s/p L nephrectomy (renal cell carcinoma)  . Carpal tunnel syndrome   . Compression fracture of lumbar vertebra ([place])   . Depression   . Deviated septum   . Diabetes ([place])   . Fatty liver 03/08/2017  . Fibromyalgia   . Hepatitis A 1980  . History of kidney cancer    Removed left kidney   . Hypercholesteremia   . Hypertension   . Kidney disease   . Migraine   . Morbid obesity ([place]) 01/06/2017  . Obesity   . Sinus congestion   . Sleep apnea   . Thyroid disease      Social History   Socioeconomic History  . Marital status: Married    Spouse name: Not on file  . Number of children: 1  . Years of education: Not on file  . Highest education level: Not on file  Occupational History  . Occupation: Retired  Electrical engineer   Tobacco Use  . Smoking status: Former Smoker  Packs/day: 1.00    Years: 10.00    Pack years: 10.00    Types: Cigarettes    Quit date: 1986    Years since quitting: 36.1  . Smokeless tobacco: Never Used  Vaping Use  . Vaping Use: Never used  Substance and Sexual Activity  . Alcohol use: Yes    Alcohol/week: 0.0 standard drinks    Comment: occasionally  . Drug use: Never  . Sexual activity: Not on file  Other Topics Concern  . 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 ANKLE SURGERY    . BACK SURGERY  2007  . COLONOSCOPY WITH ESOPHAGOGASTRODUODENOSCOPY (EGD)  [facility]   [facility]  . HAND SURGERY Right   . KNEE SURGERY Left    X2  . NASAL SEPTUM SURGERY  02/22/2019   turbinate reduction  . NEPHRECTOMY Left 2003  . ROTATOR CUFF REPAIR Right     Family History  Problem Relation Age of Onset  . Glaucoma Maternal Grandmother   . Leukemia Maternal Grandfather   . Heart attack Brother   . Diabetes Brother   . Allergies Brother   . Diabetes Brother   . Allergies Sister   . Asthma Sister   . Heart disease Mother   . Rheumatologic disease Mother   . Lupus Mother   . Angioedema Mother   . Irritable bowel syndrome Mother   . Diverticulosis Mother   . Other Mother 96       Colon Resecction   . Heart disease Father   . Diabetes Father   . Liver disease Father   . Rheumatologic disease Sister   . Cancer Paternal Grandfather        leukemia  . Asthma Child   . Allergic rhinitis Child   . Esophageal cancer Maternal Aunt   . Eczema Neg Hx   . Urticaria Neg Hx     Allergies  Allergen Reactions  . Hydrocodone Itching  . Niacin And Related Hives    Current Outpatient Medications on File Prior to Visit  Medication Sig Dispense Refill  . ALBUTEROL IN Inhale 2 puffs into the lungs as needed.    Marland Kitchen atorvastatin (LIPITOR) 80 MG tablet Take 80 mg by mouth daily.    . cetirizine (ZYRTEC) 10 MG tablet Take 10 mg by mouth daily.    . citalopram (CELEXA) 40 MG tablet Take 40 mg by mouth daily.    . empagliflozin (JARDIANCE) 25 MG TABS tablet Take 25 mg by mouth daily.    Marland Kitchen EPINEPHrine (EPIPEN 2-PAK) 0.3 mg/0.3 mL IJ SOAJ injection Inject 0.3 mLs (0.3 mg total) into the muscle as needed for anaphylaxis. 1 Device 2  . Fluticasone Propionate (XHANCE) 93 MCG/ACT EXHU Place 2  sprays into the nose in the morning and at bedtime. 32 mL 11  .  gabapentin (NEURONTIN) 600 MG tablet Take 600 mg by mouth 2 (two) times daily.     Marland Kitchen glucagon (GLUCAGEN) 1 MG SOLR injection Inject 1 mg into the vein once as needed for low blood sugar.    . hydrochlorothiazide (HYDRODIURIL) 25 MG tablet Take 25 mg by mouth daily.    . insulin aspart protamine- aspart (NOVOLOG MIX 70/30) (70-30) 100 UNIT/ML injection Inject 28 Units into the skin 2 (two) times daily with a meal.    . Insulin Syringe-Needle U-100 (INSULIN SYRINGE .3CC/31GX5/16") 31G X 5/16" 0.3 ML MISC by Does not apply route.    Marland Kitchen ketotifen (ZADITOR) 0.025 % ophthalmic solution Place 1 drop into both eyes 2 (two) times daily.     . Lancets (ONETOUCH ULTRASOFT) lancets Use as instructed 100 each 2  . levothyroxine (SYNTHROID) 75 MCG tablet Take 1 tablet (75 mcg total) by mouth daily. 30 tablet 3  . Lidocaine HCl 4 % LIQD Apply 1 application topically daily as needed.    Marland Kitchen losartan (COZAAR) 25 MG tablet Take 100 mg by mouth daily.     . metFORMIN (GLUCOPHAGE-XR) 500 MG 24 hr tablet Take 1,000 mg by mouth 2 (two) times daily.     . montelukast (SINGULAIR) 10 MG tablet Take 10 mg by mouth at bedtime.    . NON FORMULARY Allergen immunotherapy    . Olopatadine HCl (PAZEO) 0.7 % SOLN Apply 1 drop to eye daily as needed (itchy/watery eyes). 1 Bottle 3  . ondansetron (ZOFRAN) 4 MG tablet Take 1 tablet (4 mg total) by mouth every 6 (six) hours as needed for nausea or vomiting. 30 tablet 1  . ONE TOUCH ULTRA TEST test strip USE AS INSTRUCTED TO CHECK BLOOD SUGAR THREE TIMES DAILY. 100 each 5  . pantoprazole (PROTONIX) 40 MG tablet Take 40 mg by mouth daily.    . pramipexole (MIRAPEX) 0.5 MG tablet Take 0.5 mg by mouth 3 (three) times daily.    . Semaglutide,0.25 or 0.5MG /DOS, 2 MG/1.5ML SOPN Inject 0.5 mg into the skin once a week.    . tadalafil (CIALIS) 20 MG tablet Take 20 mg by mouth daily as needed for erectile dysfunction.    .  tamsulosin (FLOMAX) 0.4 MG CAPS capsule TAKE 1 CAPSULE (0.4 MG TOTAL) BY MOUTH DAILY. 30 capsule 5  . testosterone cypionate (DEPOTESTOSTERONE CYPIONATE) 200 MG/ML injection Inject 200 mg into the muscle.    . Tiotropium Bromide Monohydrate (SPIRIVA RESPIMAT) 1.25 MCG/ACT AERS Inhale 2 puffs into the lungs daily. 1 Inhaler 3  . urea (CARMOL) 10 % cream Apply topically 3 (three) times daily.     No current facility-administered medications on file prior to visit.    BP 131/79 (BP Location: Right Arm, Patient Position: Sitting, Cuff Size: Large)   Pulse 87   Temp 98.3 F (36.8 C) (Oral)   Resp 16   Ht 5\' 4"  (1.626 m)   Wt 214 lb (97.1 kg)   SpO2 99%   BMI 36.73 kg/m        Objective:   Physical Exam Constitutional:      General: He is not in acute distress.    Appearance: He is well-developed and well-nourished.  HENT:     Head: Normocephalic and atraumatic.  Cardiovascular:     Rate and Rhythm: Normal rate and regular rhythm.     Heart sounds: No murmur heard.   Pulmonary:     Effort: Pulmonary effort is normal. No respiratory distress.     Breath  sounds: Normal breath sounds. No wheezing or rales.  Musculoskeletal:        General: No edema.  Skin:    General: Skin is warm and dry.  Neurological:     Mental Status: He is alert and oriented to person, place, and time.  Psychiatric:        Mood and Affect: Mood and affect normal.        Behavior: Behavior normal.        Thought Content: Thought content normal.           Assessment & Plan:  DM2-I have reminded the patient not to skip meals especially his midday meal which is when most of his hypoglycemic events occur. Advised him to decrease his 70/30 insulin from 30 units twice daily to 28 units twice daily we will also arrange for him to establish with another endocrinologist. His most recent A1c performed at the [facility] was 7.8.                                                                                                                                                                                    Hyperlipidemia- tolerating atorvastatin 80mg .  Obtain follow up lipid panel. Continue current dose.  HTN- bp stable. Continue hctz 25mg  and losartan 25mg .    ED- stable, continue cialis 20mg .   BPH- stable on flomax 0.4mg  once daily.                   Hx of renal cell carcinoma- age 5, s/p radical nephrectomy. Continues to follow with the [place].  Asthma- stable, management per pulmonology/allergy.   Depression- stable on citalopram 40mg . Continue same.   Hypothyroid- TSH wnl at the [place].  Continue synthroid 75mcg once daily.  RLS- stable, continue mirapex 0.5mg .   This visit occurred during the SARS-CoV-2 public health emergency.  Safety protocols were in place, including screening questions prior to the visit, additional usage of staff PPE, and extensive cleaning of exam room while observing appropriate contact time as indicated for disinfecting solutions.

## 2021-01-30 ENCOUNTER — Ambulatory Visit (INDEPENDENT_AMBULATORY_CARE_PROVIDER_SITE_OTHER): Payer: No Typology Code available for payment source | Admitting: *Deleted

## 2021-01-30 DIAGNOSIS — J309 Allergic rhinitis, unspecified: Secondary | ICD-10-CM | POA: Diagnosis not present

## 2021-01-31 NOTE — Telephone Encounter (Signed)
Opened in error

## 2021-02-03 DIAGNOSIS — J301 Allergic rhinitis due to pollen: Secondary | ICD-10-CM | POA: Diagnosis not present

## 2021-02-03 NOTE — Progress Notes (Signed)
VIAL EXP 02/03/22 

## 2021-02-04 DIAGNOSIS — J302 Other seasonal allergic rhinitis: Secondary | ICD-10-CM | POA: Diagnosis not present

## 2021-02-19 ENCOUNTER — Ambulatory Visit (INDEPENDENT_AMBULATORY_CARE_PROVIDER_SITE_OTHER): Payer: No Typology Code available for payment source

## 2021-02-19 DIAGNOSIS — J309 Allergic rhinitis, unspecified: Secondary | ICD-10-CM

## 2021-02-21 IMAGING — NM NM HEPATO W/GB/PHARM/[PERSON_NAME]
2 series · 12 of 12 positions shown · non-contrast
Comparison: None.

CLINICAL DATA: Right upper quadrant pain with nausea and vomiting.
Diarrhea.

EXAM:
NUCLEAR MEDICINE HEPATOBILIARY IMAGING WITH GALLBLADDER EF
TECHNIQUE: Sequential images of the abdomen were obtained [DATE] minutes
following intravenous administration of radiopharmaceutical. After
oral ingestion of Ensure, gallbladder ejection fraction was
determined. At 60 min, normal ejection fraction is greater than 33%.
RADIOPHARMACEUTICALS:  5.2 mCi Lc-11m  Choletec IV

[Series 1: biliary · 4.14mm/px · 6 of 60 frames shown]
[frame 6/60]
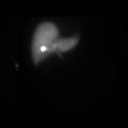
[frame 16/60]
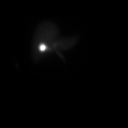
[frame 26/60]
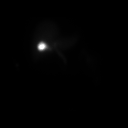
[frame 36/60]
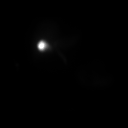
[frame 46/60]
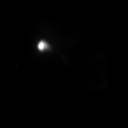
[frame 56/60]
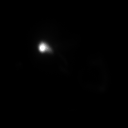

[Series 2: gbef · 4.14mm/px · 6 of 60 frames shown]
[frame 6/60]
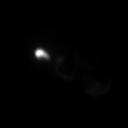
[frame 16/60]
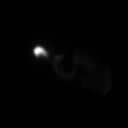
[frame 26/60]
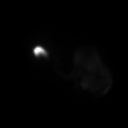
[frame 36/60]
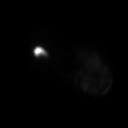
[frame 46/60]
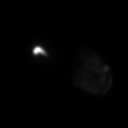
[frame 56/60]
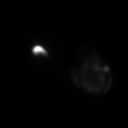

[12 of 12 positions shown; findings below may reference images not displayed]

FINDINGS: Prompt uptake and biliary excretion of activity by the liver is
seen. Gallbladder activity is visualized, consistent with patency of
cystic duct. Biliary activity passes into small bowel, consistent
with patent common bile duct.

Calculated gallbladder ejection fraction is 58%. (Normal gallbladder
ejection fraction with Ensure is greater than 33%.)
IMPRESSION: Normal study.

## 2021-02-26 ENCOUNTER — Encounter: Payer: Medicare Other | Admitting: Family

## 2021-03-03 ENCOUNTER — Telehealth: Payer: Self-pay | Admitting: Family

## 2021-03-03 NOTE — Telephone Encounter (Signed)
Lvm for [name] to be aware form was received and is on provider's folder to be completed this week.

## 2021-03-03 NOTE — Telephone Encounter (Signed)
Caller: Jeani Hawking [facility]) Call back # [phone]   Wants to know if you received clearance form, fax on 02/10 & 03/01

## 2021-03-06 ENCOUNTER — Ambulatory Visit (INDEPENDENT_AMBULATORY_CARE_PROVIDER_SITE_OTHER): Payer: No Typology Code available for payment source

## 2021-03-06 DIAGNOSIS — J309 Allergic rhinitis, unspecified: Secondary | ICD-10-CM | POA: Diagnosis not present

## 2021-03-06 NOTE — Telephone Encounter (Signed)
Provider is not in the office today will complete form tomorrow. Tried to call Jeani Hawking back to let her know but no answer.

## 2021-03-06 NOTE — Telephone Encounter (Signed)
Caller: [facility]  Call Back @ [phone]   [name], is calling in reference to medical clearance for surgery. Patient will have a procedure next week. [name] is checking the status.

## 2021-03-07 NOTE — Telephone Encounter (Signed)
Provider has the medical clearance forms however, patient missed appointment on 02-26-2021 for medical clearance check up. Lvm for patient to call and reschedule the missed appointment.

## 2021-03-10 NOTE — Telephone Encounter (Signed)
Patient reports he is "doing the clearance trought the [place]" I explained to him  F. Called asking for the clearance from [place]. He will double check with them tomorrow and let us know if we need to fill out for him or not. In case we need to fill out I told him to call for appointment.

## 2021-03-11 ENCOUNTER — Other Ambulatory Visit: Payer: Self-pay

## 2021-03-11 ENCOUNTER — Ambulatory Visit: Payer: Medicare Other | Admitting: Internal Medicine

## 2021-03-11 ENCOUNTER — Encounter: Payer: Self-pay | Admitting: Family

## 2021-03-11 ENCOUNTER — Ambulatory Visit (INDEPENDENT_AMBULATORY_CARE_PROVIDER_SITE_OTHER): Payer: No Typology Code available for payment source | Admitting: Family

## 2021-03-11 VITALS — BP 131/90 | HR 100 | Temp 98.4°F | Resp 16 | Ht 64.0 in | Wt 213.0 lb

## 2021-03-11 DIAGNOSIS — G4733 Obstructive sleep apnea (adult) (pediatric): Secondary | ICD-10-CM | POA: Diagnosis not present

## 2021-03-11 DIAGNOSIS — E039 Hypothyroidism, unspecified: Secondary | ICD-10-CM | POA: Diagnosis not present

## 2021-03-11 DIAGNOSIS — I10 Essential (primary) hypertension: Secondary | ICD-10-CM

## 2021-03-11 DIAGNOSIS — E118 Type 2 diabetes mellitus with unspecified complications: Secondary | ICD-10-CM

## 2021-03-11 DIAGNOSIS — Z01818 Encounter for other preprocedural examination: Secondary | ICD-10-CM

## 2021-03-11 DIAGNOSIS — Z794 Long term (current) use of insulin: Secondary | ICD-10-CM

## 2021-03-11 NOTE — Patient Instructions (Addendum)
Please complete lab work prior to leaving. Please decrease your evening 70/30 dose to 25 units instead of 30 units.

## 2021-03-11 NOTE — Progress Notes (Signed)
Subjective:    Patient ID: [name], male    DOB: 03-Aug-1961, 60 y.o.   MRN: [id number]  HPI  Patient is a 60 yr old male who presents today for pre-operative evaluation for left total knee revision which is scheduled for   Patient had CXR on 03/07/21 at [facility]:  FINDINGS:  Lungs are clear. Heart size and pulmonary vascularity are normal. No  adenopathy. No pneumothorax. No bone lesions.   IMPRESSION:  Lungs clear. Cardiac silhouette normal.   A1C 3/11 was 7.2 UA- + glucose, protein Normal ESR.    His surgery is scheduled for 03/14/21. He will have a knee revision at [facility].   Reports glucose 52 this AM, 48 yesterday.   Reports that he is using 70/30 if sugar is >150 he will give himself 30 units. Same in the evening.  >150 he will give himself 30 units.  Pt had to reschedule his consult.    Also on ozempic, jardiance. Actos was discontinued.    OSA- on bipap  Review of Systems  Constitutional: Negative for unexpected weight change.  HENT: Negative for rhinorrhea.   Eyes: Positive for itching (due to allergies).  Respiratory: Negative for cough and shortness of breath.   Cardiovascular: Negative for chest pain.  Gastrointestinal: Negative for constipation and diarrhea.  Genitourinary: Positive for frequency (chronic). Negative for dysuria.  Musculoskeletal: Positive for arthralgias and back pain.  Skin: Negative for rash.  Neurological: Positive for headaches (occasional tension headaches).  Hematological: Negative for adenopathy.  Psychiatric/Behavioral:       Mood is stable. Stressed about upcoming surgery.      Past Medical History:  Diagnosis Date  . Arthritis   . Asthma   . Cancer of kidney ([place])    s/p L nephrectomy (renal cell carcinoma)  . Carpal tunnel syndrome   . Compression fracture of lumbar vertebra ([place])   . Depression   . Deviated septum   . Diabetes ([place])   . Fatty liver 03/08/2017  . Fibromyalgia   . Hepatitis A  1980  . History of kidney cancer    Removed left kidney   . Hypercholesteremia   . Hypertension   . Kidney disease   . Migraine   . Morbid obesity ([place]) 01/06/2017  . Obesity   . Sinus congestion   . Sleep apnea   . Thyroid disease      Social History   Socioeconomic History  . Marital status: Married    Spouse name: Not on file  . Number of children: 1  . Years of education: Not on file  . Highest education level: Not on file  Occupational History  . Occupation: Retired  Electrical engineer   Tobacco Use  . Smoking status: Former Smoker    Packs/day: 1.00    Years: 10.00    Pack years: 10.00    Types: Cigarettes    Quit date: 1986    Years since quitting: 36.2  . Smokeless tobacco: Never Used  Vaping Use  . Vaping Use: Never used  Substance and Sexual Activity  . Alcohol use: Yes    Alcohol/week: 0.0 standard drinks    Comment: occasionally  . Drug use: Never  . Sexual activity: Not on file  Other Topics Concern  . 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 ANKLE SURGERY    . BACK SURGERY  2007  . COLONOSCOPY WITH ESOPHAGOGASTRODUODENOSCOPY (EGD)  [facility]   [facility]  . HAND SURGERY Right   . KNEE SURGERY Left    X2  . NASAL SEPTUM SURGERY  02/22/2019   turbinate reduction  . NEPHRECTOMY Left 2003  . ROTATOR CUFF REPAIR Right     Family History   Problem Relation Age of Onset  . Glaucoma Maternal Grandmother   . Leukemia Maternal Grandfather   . Heart attack Brother   . Diabetes Brother   . Allergies Brother   . Diabetes Brother   . Allergies Sister   . Asthma Sister   . Heart disease Mother   . Rheumatologic disease Mother   . Lupus Mother   . Angioedema Mother   . Irritable bowel syndrome Mother   . Diverticulosis Mother   . Other Mother 53       Colon Resecction   . Heart disease Father   . Diabetes Father   . Liver disease Father   . Rheumatologic disease Sister   . Cancer Paternal Grandfather        leukemia  . Asthma Child   . Allergic rhinitis Child   . Esophageal cancer Maternal Aunt   . Eczema Neg Hx   . Urticaria Neg Hx     Allergies  Allergen Reactions  . Hydrocodone Itching  . Niacin And Related Hives    Current Outpatient Medications on File Prior to Visit  Medication Sig Dispense Refill  . ALBUTEROL IN Inhale 2 puffs into the lungs as needed.    Marland Kitchen atorvastatin (LIPITOR) 80 MG tablet Take 80 mg by mouth daily.    . cetirizine (ZYRTEC) 10 MG tablet Take 10 mg by mouth daily.    . citalopram (CELEXA) 40 MG tablet Take 40 mg by mouth daily.    . empagliflozin (JARDIANCE) 25 MG TABS tablet Take 25 mg by mouth daily.    Marland Kitchen EPINEPHrine (EPIPEN 2-PAK) 0.3 mg/0.3 mL IJ SOAJ injection Inject 0.3 mLs (0.3 mg total) into the muscle as needed for anaphylaxis. 1 Device 2  . Fluticasone-Salmeterol (ADVAIR DISKUS) 250-50 MCG/DOSE AEPB Inhale 1 puff into the lungs 2 (two) times daily.  3  . gabapentin (NEURONTIN) 600 MG tablet Take 600 mg by mouth 2 (two) times daily.     Marland Kitchen glucagon (GLUCAGEN) 1 MG SOLR injection Inject 1 mg into the vein once as needed for low blood sugar.    . hydrochlorothiazide (HYDRODIURIL) 25 MG tablet Take 25 mg by mouth daily.    . insulin aspart protamine- aspart (NOVOLOG MIX 70/30) (70-30) 100 UNIT/ML injection Inject 28 Units into the skin 2 (two) times daily with a meal.    . Insulin  Syringe-Needle U-100 (INSULIN SYRINGE .3CC/31GX5/16") 31G X 5/16" 0.3 ML MISC by Does not apply route.    Marland Kitchen ketotifen (ZADITOR) 0.025 % ophthalmic solution Place 1 drop into both eyes 2 (two) times daily.     Marland Kitchen  Lancets (ONETOUCH ULTRASOFT) lancets Use as instructed 100 each 2  . levothyroxine (SYNTHROID) 75 MCG tablet Take 1 tablet (75 mcg total) by mouth daily. 30 tablet 3  . Lidocaine HCl 4 % LIQD Apply 1 application topically daily as needed.    Marland Kitchen losartan (COZAAR) 25 MG tablet Take 100 mg by mouth daily.     . metFORMIN (GLUCOPHAGE-XR) 500 MG 24 hr tablet Take 1,000 mg by mouth 2 (two) times daily.     . montelukast (SINGULAIR) 10 MG tablet Take 10 mg by mouth at bedtime.    . NON FORMULARY Allergen immunotherapy    . Olopatadine HCl (PAZEO) 0.7 % SOLN Apply 1 drop to eye daily as needed (itchy/watery eyes). 1 Bottle 3  . ondansetron (ZOFRAN) 4 MG tablet Take 1 tablet (4 mg total) by mouth every 6 (six) hours as needed for nausea or vomiting. 30 tablet 1  . ONE TOUCH ULTRA TEST test strip USE AS INSTRUCTED TO CHECK BLOOD SUGAR THREE TIMES DAILY. 100 each 5  . pantoprazole (PROTONIX) 40 MG tablet Take 40 mg by mouth daily.    . pramipexole (MIRAPEX) 0.5 MG tablet Take 0.5 mg by mouth 3 (three) times daily.    . Semaglutide,0.25 or 0.5MG/DOS, 2 MG/1.5ML SOPN Inject 0.5 mg into the skin once a week.    . tadalafil (CIALIS) 20 MG tablet Take 20 mg by mouth daily as needed for erectile dysfunction.    . tamsulosin (FLOMAX) 0.4 MG CAPS capsule TAKE 1 CAPSULE (0.4 MG TOTAL) BY MOUTH DAILY. 30 capsule 5  . testosterone cypionate (DEPOTESTOSTERONE CYPIONATE) 200 MG/ML injection Inject 200 mg into the muscle.    . Tiotropium Bromide Monohydrate (SPIRIVA RESPIMAT) 1.25 MCG/ACT AERS Inhale 2 puffs into the lungs daily. 1 Inhaler 3  . urea (CARMOL) 10 % cream Apply topically 3 (three) times daily.     No current facility-administered medications on file prior to visit.    BP 131/90 (BP Location: Right  Arm, Patient Position: Sitting, Cuff Size: Large)   Pulse 100   Temp 98.4 F (36.9 C) (Oral)   Resp 16   Ht 5' 4"  (1.626 m)   Wt 213 lb (96.6 kg)   SpO2 97%   BMI 36.56 kg/m    Objective:   Physical Exam Constitutional:      General: He is not in acute distress.    Appearance: He is well-developed.  HENT:     Head: Normocephalic and atraumatic.  Cardiovascular:     Rate and Rhythm: Normal rate and regular rhythm.     Heart sounds: No murmur heard.   Pulmonary:     Effort: Pulmonary effort is normal. No respiratory distress.     Breath sounds: Normal breath sounds. No wheezing or rales.  Skin:    General: Skin is warm and dry.  Neurological:     Mental Status: He is alert and oriented to person, place, and time.  Psychiatric:        Behavior: Behavior normal.        Thought Content: Thought content normal.           Assessment & Plan:  Pre-operative evaluation- EKG tracing is personally reviewed.  EKG notes NSR.  No acute changes.  CXR clear 3/11.  Will obtain CMET, CBC, urine culture, PT/INR.  Medical clearance pending review of these results.  OSA- recommend 24 hour observation in stepdown post-operatively due to OSA requiring Bipap.   DM2-A1C is fair at 7.2.  Having  AM hypoglycemia. Will decrease his PM 70/30 dose from 30 units to 25 units.He is advised to establish with endo as scheduled. Continue metformin xr 530m bid, Ozempic 0.530mweekly, jardiance 2562mnce daily.   Hypothyroid- [facility]. Continue synthroid 75 mcg once daily.    HTN-  Maintained on losartan 6m28mctz 6mg35m acceptable.   BP Readings from Last 3 Encounters:  03/11/21 131/90  01/28/21 131/79  09/18/20 138/80    43 minutes spent on today's visit. Time was spent reviewing outside records, counseling patient, formulating medical plan.  This visit occurred during the SARS-CoV-2 public health emergency.  Safety protocols were in place, including screening  questions prior to the visit, additional usage of staff PPE, and extensive cleaning of exam room while observing appropriate contact time as indicated for disinfecting solutions.

## 2021-03-11 NOTE — Telephone Encounter (Signed)
Patient called back and scheduled follow up for medical clearance today

## 2021-03-12 LAB — PROTIME-INR
INR: 1.1 ratio — ABNORMAL HIGH (ref 0.8–1.0)
Prothrombin Time: 11.9 s (ref 9.6–13.1)

## 2021-03-12 LAB — COMPREHENSIVE METABOLIC PANEL
ALT: 25 U/L (ref 0–53)
AST: 18 U/L (ref 0–37)
Albumin: 3.9 g/dL (ref 3.5–5.2)
Alkaline Phosphatase: 100 U/L (ref 39–117)
BUN: 27 mg/dL — ABNORMAL HIGH (ref 6–23)
CO2: 26 mEq/L (ref 19–32)
Calcium: 9.5 mg/dL (ref 8.4–10.5)
Chloride: 100 mEq/L (ref 96–112)
Creatinine, Ser: 1.31 mg/dL (ref 0.40–1.50)
GFR: 60.04 mL/min (ref >60.00)
Glucose, Bld: 150 mg/dL — ABNORMAL HIGH (ref 70–99)
Potassium: 3.9 mEq/L (ref 3.5–5.1)
Sodium: 137 mEq/L (ref 135–145)
Total Bilirubin: 0.6 mg/dL (ref 0.2–1.2)
Total Protein: 6.9 g/dL (ref 6.0–8.3)

## 2021-03-12 LAB — CBC WITH DIFFERENTIAL/PLATELET
Basophils Absolute: 0.1 10*3/uL (ref 0.0–0.1)
Basophils Relative: 0.7 % (ref 0.0–3.0)
Eosinophils Absolute: 0.4 10*3/uL (ref 0.0–0.7)
Eosinophils Relative: 4 % (ref 0.0–5.0)
HCT: 46.8 % (ref 39.0–52.0)
Hemoglobin: 16 g/dL (ref 13.0–17.0)
Lymphocytes Relative: 24.1 % (ref 12.0–46.0)
Lymphs Abs: 2.2 10*3/uL (ref 0.7–4.0)
MCHC: 34.2 g/dL (ref 30.0–36.0)
MCV: 84.9 fl (ref 78.0–100.0)
Monocytes Absolute: 0.8 10*3/uL (ref 0.1–1.0)
Monocytes Relative: 8.9 % (ref 3.0–12.0)
Neutro Abs: 5.7 10*3/uL (ref 1.4–7.7)
Neutrophils Relative %: 62.3 % (ref 43.0–77.0)
Platelets: 271 10*3/uL (ref 150.0–400.0)
RBC: 5.51 Mil/uL (ref 4.22–5.81)
RDW: 14 % (ref 11.5–15.5)
WBC: 9.2 10*3/uL (ref 4.0–10.5)

## 2021-03-12 LAB — URINE CULTURE
MICRO NUMBER:: 11649002
Result:: NO GROWTH
SPECIMEN QUALITY:: ADEQUATE

## 2021-03-13 NOTE — Telephone Encounter (Signed)
Clearance faxed back to [facility] at [facility] with lab resutls (10 pages faxed)

## 2021-03-14 DIAGNOSIS — Z96652 Presence of left artificial knee joint: Secondary | ICD-10-CM

## 2021-03-14 HISTORY — DX: Presence of left artificial knee joint: Z96.652

## 2021-03-24 ENCOUNTER — Encounter: Payer: Self-pay | Admitting: Family Medicine

## 2021-03-24 ENCOUNTER — Ambulatory Visit (INDEPENDENT_AMBULATORY_CARE_PROVIDER_SITE_OTHER): Payer: No Typology Code available for payment source | Admitting: Family Medicine

## 2021-03-24 ENCOUNTER — Other Ambulatory Visit: Payer: Self-pay

## 2021-03-24 DIAGNOSIS — J302 Other seasonal allergic rhinitis: Secondary | ICD-10-CM

## 2021-03-24 DIAGNOSIS — H1010 Acute atopic conjunctivitis, unspecified eye: Secondary | ICD-10-CM

## 2021-03-24 DIAGNOSIS — H1013 Acute atopic conjunctivitis, bilateral: Secondary | ICD-10-CM

## 2021-03-24 DIAGNOSIS — J3089 Other allergic rhinitis: Secondary | ICD-10-CM

## 2021-03-24 DIAGNOSIS — J4540 Moderate persistent asthma, uncomplicated: Secondary | ICD-10-CM

## 2021-03-24 MED ORDER — FEXOFENADINE HCL 180 MG PO TABS: 180.0000 mg | ORAL_TABLET | Freq: Every day | ORAL | 5 refills | Status: DC

## 2021-03-24 MED ORDER — BUDESONIDE-FORMOTEROL FUMARATE 160-4.5 MCG/ACT IN AERO: INHALATION_SPRAY | RESPIRATORY_TRACT | 5 refills | Status: DC

## 2021-03-24 MED ORDER — SPIRIVA RESPIMAT 1.25 MCG/ACT IN AERS: 2.0000 | INHALATION_SPRAY | Freq: Every day | RESPIRATORY_TRACT | 5 refills | Status: DC

## 2021-03-24 MED ORDER — LEVOCETIRIZINE DIHYDROCHLORIDE 5 MG PO TABS: 2.5000 mg | ORAL_TABLET | Freq: Every evening | ORAL | 1 refills | Status: DC

## 2021-03-24 NOTE — Progress Notes (Signed)
RE: [name] MRN: [id number] DOB: December 27, 1961 Date of Telemedicine Visit: 03/24/2021  Referring provider: Doreene Sanchez* Primary care provider: [name], NP  Chief Complaint: Allergies   Telemedicine Follow Up Visit via Telephone: I connected with [name] for a follow up on 03/24/21 by telephone and verified that I am speaking with the correct person using two identifiers.   I discussed the limitations, risks, security and privacy concerns of performing an evaluation and management service by telephone and the availability of in person appointments. I also discussed with the patient that there may be a patient responsible charge related to this service. The patient expressed understanding and agreed to proceed.  Patient is at home  Provider is at the office.  Visit start time: 3151 Visit end time: [place]rill consent/check in by: [facility] consent and medical assistant/nurse: Damita  History of Present Illness: He is a 60 y.o. male, who is being followed for asthma, allergic rhinitis on allergen immunotherapy, and allergic conjunctivitis. His previous allergy office visit was on 09/18/2020 with [name], [place]. At today's visit, he reports his asthma has been well controlled with no shortness of breath, cough, or wheeze.  He does report that he is recovering from total knee replacement and has been in the house has significantly decreased his activity level recently.  He continues montelukast 10 mg once a day, Spiriva 1.25 mcg - 2 puffs once a day and is using either Advair or Symbicort.  [facility]  Allergic rhinitis is reported as poorly controlled with symptoms including clear rhinorrhea, nasal congestion, sneeze, and copious postnasal drainage with throat clearing.  He continues cetirizine 10 mg once a day, Flovent once a day, and nasal saline rinses daily.  He does report poor application technique with  Flonase.  He continues allergen immunotherapy with no large local reactions.  He reports a significant decrease in his symptoms of allergic rhinitis while continuing on allergen immunotherapy.  Allergic conjunctivitis is reported as moderately well controlled with symptoms including itchy and watery eyes for which he uses olopatadine with relief of symptoms.  His current medications are listed in the chart.   Assessment and Plan: [name] is a 60 y.o. male with: Patient Instructions  Asthma Continue Symbicort 1 puff twice a day with a spacer to prevent cough or wheeze Continue Spiriva 1.25-2 puffs once a day to prevent cough or wheeze Continue montelukast 10 mg once a day to prevent cough or wheeze Continue albuterol 2 puffs every 4 hours as needed for cough or wheeze For asthma flare increase Symbicort to 2 puffs twice a day with a spacer for 2 weeks or until cough and wheeze free  Allergic rhinitis Continue avoidance measures directed toward pollens and mold as listed below Continue allergen immunotherapy and have access to an epinephrine auto-injector set Begin levocetirizine 5 mg once a day as needed for a runny nose.  Continue Flonase 2 sprays in each nostril once a day for nasal congestion.  In the right nostril, point the applicator out toward the right ear. In the left nostril, point the applicator out toward the left ear Continue saline nasal rinses as needed for nasal symptoms. Use this before any medicated nasal sprays for best result  Allergic conjunctivitis Continue Pazeo one drop in each eye once a day as needed for red, itchy eyes.   Call the clinic if this treatment plan is not working well for you  Follow  up in 6 months or sooner if needed.    Return in about 6 months (around 09/24/2021), or if symptoms worsen or fail to improve.  Meds ordered this encounter  Medications  . Tiotropium Bromide Monohydrate (SPIRIVA RESPIMAT) 1.25 MCG/ACT AERS    Sig: Inhale 2 puffs into  the lungs daily.    Dispense:  1 each    Refill:  5  . budesonide-formoterol (SYMBICORT) 160-4.5 MCG/ACT inhaler    Sig: 2 puffs twice daily with spacer to prevent coughing or wheezing    Dispense:  1 each    Refill:  5  . DISCONTD: levocetirizine (XYZAL) 5 MG tablet    Sig: Take 0.5 tablets (2.5 mg total) by mouth every evening.    Dispense:  90 tablet    Refill:  1    Dispense 90 day supply.  . fexofenadine (ALLEGRA) 180 MG tablet    Sig: Take 1 tablet (180 mg total) by mouth daily.    Dispense:  31 tablet    Refill:  5    Medication List:  Current Outpatient Medications  Medication Sig Dispense Refill  . ALBUTEROL IN Inhale 2 puffs into the lungs as needed.    Marland Kitchen aspirin 81 MG EC tablet Take by mouth daily.    Marland Kitchen atorvastatin (LIPITOR) 80 MG tablet Take 80 mg by mouth daily.    . budesonide-formoterol (SYMBICORT) 160-4.5 MCG/ACT inhaler 2 puffs twice daily with spacer to prevent coughing or wheezing 1 each 5  . Cholecalciferol 50 MCG (2000 UT) TABS Take by mouth daily.    . citalopram (CELEXA) 40 MG tablet Take 40 mg by mouth daily.    . empagliflozin (JARDIANCE) 25 MG TABS tablet Take 25 mg by mouth daily.    . fexofenadine (ALLEGRA) 180 MG tablet Take 1 tablet (180 mg total) by mouth daily. 31 tablet 5  . Fluticasone-Salmeterol (ADVAIR DISKUS) 250-50 MCG/DOSE AEPB Inhale 1 puff into the lungs 2 (two) times daily.  3  . gabapentin (NEURONTIN) 600 MG tablet Take 600 mg by mouth 2 (two) times daily.     Marland Kitchen glucagon (GLUCAGEN) 1 MG SOLR injection Inject 1 mg into the vein once as needed for low blood sugar.    . hydrochlorothiazide (HYDRODIURIL) 25 MG tablet Take 25 mg by mouth daily.    . insulin aspart protamine- aspart (NOVOLOG MIX 70/30) (70-30) 100 UNIT/ML injection Take 25 units SQ in AM and 25 units SQ in PM.  Hold if glucose <150    . Insulin Syringe-Needle U-100 (INSULIN SYRINGE .3CC/31GX5/16") 31G X 5/16" 0.3 ML MISC by Does not apply route.    Marland Kitchen ketotifen (ZADITOR) 0.025 %  ophthalmic solution Place 1 drop into both eyes 2 (two) times daily.     . Lancets (ONETOUCH ULTRASOFT) lancets Use as instructed 100 each 2  . levothyroxine (SYNTHROID) 75 MCG tablet Take 1 tablet (75 mcg total) by mouth daily. 30 tablet 3  . Lidocaine HCl 4 % LIQD Apply 1 application topically daily as needed.    Marland Kitchen losartan (COZAAR) 25 MG tablet Take 100 mg by mouth daily.     . metFORMIN (GLUCOPHAGE-XR) 500 MG 24 hr tablet Take 1,000 mg by mouth 2 (two) times daily.     . montelukast (SINGULAIR) 10 MG tablet Take 10 mg by mouth at bedtime.    . NON FORMULARY Allergen immunotherapy    . ondansetron (ZOFRAN) 4 MG tablet Take 1 tablet (4 mg total) by mouth every 6 (six) hours as needed  for nausea or vomiting. 30 tablet 1  . ONE TOUCH ULTRA TEST test strip USE AS INSTRUCTED TO CHECK BLOOD SUGAR THREE TIMES DAILY. 100 each 5  . pramipexole (MIRAPEX) 0.5 MG tablet Take 0.5 mg by mouth 3 (three) times daily.    . Semaglutide,0.25 or 0.5MG /DOS, 2 MG/1.5ML SOPN Inject 0.5 mg into the skin once a week.    . tadalafil (CIALIS) 20 MG tablet Take 20 mg by mouth daily as needed for erectile dysfunction.    . tamsulosin (FLOMAX) 0.4 MG CAPS capsule TAKE 1 CAPSULE (0.4 MG TOTAL) BY MOUTH DAILY. 30 capsule 5  . testosterone cypionate (DEPOTESTOSTERONE CYPIONATE) 200 MG/ML injection Inject 200 mg into the muscle every 14 (fourteen) days.    . urea (CARMOL) 10 % cream Apply topically as needed.    . Calcium Polycarbophil (FIBER) 625 MG TABS Take by mouth as needed.    Marland Kitchen EPINEPHrine (EPIPEN 2-PAK) 0.3 mg/0.3 mL IJ SOAJ injection Inject 0.3 mLs (0.3 mg total) into the muscle as needed for anaphylaxis. (Patient not taking: Reported on 03/24/2021) 1 Device 2  . Olopatadine HCl (PAZEO) 0.7 % SOLN Apply 1 drop to eye daily as needed (itchy/watery eyes). (Patient not taking: Reported on 03/24/2021) 1 Bottle 3  . pantoprazole (PROTONIX) 40 MG tablet Take 40 mg by mouth daily. (Patient not taking: Reported on 03/24/2021)     . Tiotropium Bromide Monohydrate (SPIRIVA RESPIMAT) 1.25 MCG/ACT AERS Inhale 2 puffs into the lungs daily. 1 each 5   No current facility-administered medications for this visit.   Allergies: Allergies  Allergen Reactions  . Hydrocodone Itching  . Niacin And Related Hives   I reviewed his past medical history, social history, family history, and environmental history and no significant changes have been reported from previous visit on 09/18/2020.  Objective: Physical Exam Not obtained as encounter was done via telephone.   Previous notes and tests were reviewed.  I discussed the assessment and treatment plan with the patient. The patient was provided an opportunity to ask questions and all were answered. The patient agreed with the plan and demonstrated an understanding of the instructions.   The patient was advised to call back or seek an in-person evaluation if the symptoms worsen or if the condition fails to improve as anticipated.  I provided 30 minutes of non-face-to-face time during this encounter.  It was my pleasure to participate in [place] care today. Please feel free to contact me with any questions or concerns.   Sincerely,  Wesley Morgan, FNP

## 2021-03-24 NOTE — Patient Instructions (Addendum)
Asthma Continue Symbicort 1 puff twice a day with a spacer to prevent cough or wheeze Continue Spiriva 1.25-2 puffs once a day to prevent cough or wheeze Continue montelukast 10 mg once a day to prevent cough or wheeze Continue albuterol 2 puffs every 4 hours as needed for cough or wheeze For asthma flare increase Symbicort to 2 puffs twice a day with a spacer for 2 weeks or until cough and wheeze free  Allergic rhinitis Continue avoidance measures directed toward pollens and mold as listed below Continue allergen immunotherapy and have access to an epinephrine auto-injector set Begin Allegra mg once a day as needed for a runny nose or itch. This will replace cetirizine for now Continue Flonase 2 sprays in each nostril once a day for nasal congestion.  In the right nostril, point the applicator out toward the right ear. In the left nostril, point the applicator out toward the left ear Continue saline nasal rinses as needed for nasal symptoms. Use this before any medicated nasal sprays for best result  Allergic conjunctivitis Continue Pazeo one drop in each eye once a day as needed for red, itchy eyes.   Call the clinic if this treatment plan is not working well for you  Follow up in 6 months or sooner if needed.  Reducing Pollen Exposure The American Academy of Allergy, Asthma and Immunology suggests the following steps to reduce your exposure to pollen during allergy seasons. 1. Do not hang sheets or clothing out to dry; pollen may collect on these items. 2. Do not mow lawns or spend time around freshly cut grass; mowing stirs up pollen. 3. Keep windows closed at night.  Keep car windows closed while driving. 4. Minimize morning activities outdoors, a time when pollen counts are usually at their highest. 5. Stay indoors as much as possible when pollen counts or humidity is high and on windy days when pollen tends to remain in the air longer. 6. Use air conditioning when possible.  Many  air conditioners have filters that trap the pollen spores. 7. Use a HEPA room air filter to remove pollen form the indoor air you breathe.  Control of Mold Allergen Mold and fungi can grow on a variety of surfaces provided certain temperature and moisture conditions exist.  Outdoor molds grow on plants, decaying vegetation and soil.  The major outdoor mold, Alternaria and Cladosporium, are found in very high numbers during hot and dry conditions.  Generally, a late Summer - Fall peak is seen for common outdoor fungal spores.  Rain will temporarily lower outdoor mold spore count, but counts rise rapidly when the rainy period ends.  The most important indoor molds are Aspergillus and Penicillium.  Dark, humid and poorly ventilated basements are ideal sites for mold growth.  The next most common sites of mold growth are the bathroom and the kitchen.  Outdoor [employer] 8. Use air conditioning and keep windows closed 9. Avoid exposure to decaying vegetation. 10. Avoid leaf raking. 11. Avoid grain handling. 12. Consider wearing a face mask if working in moldy areas.  Indoor Mold Control 1. Maintain humidity below 50%. 2. Clean washable surfaces with 5% bleach solution. 3. Remove sources e.g. Contaminated carpets.

## 2021-04-01 ENCOUNTER — Ambulatory Visit (INDEPENDENT_AMBULATORY_CARE_PROVIDER_SITE_OTHER): Payer: No Typology Code available for payment source

## 2021-04-01 DIAGNOSIS — J309 Allergic rhinitis, unspecified: Secondary | ICD-10-CM | POA: Diagnosis not present

## 2021-04-18 ENCOUNTER — Ambulatory Visit (INDEPENDENT_AMBULATORY_CARE_PROVIDER_SITE_OTHER): Payer: No Typology Code available for payment source | Admitting: *Deleted

## 2021-04-18 DIAGNOSIS — J309 Allergic rhinitis, unspecified: Secondary | ICD-10-CM

## 2021-05-01 ENCOUNTER — Ambulatory Visit (INDEPENDENT_AMBULATORY_CARE_PROVIDER_SITE_OTHER): Payer: No Typology Code available for payment source | Admitting: *Deleted

## 2021-05-01 DIAGNOSIS — J309 Allergic rhinitis, unspecified: Secondary | ICD-10-CM | POA: Diagnosis not present

## 2021-05-16 ENCOUNTER — Ambulatory Visit (INDEPENDENT_AMBULATORY_CARE_PROVIDER_SITE_OTHER): Payer: No Typology Code available for payment source

## 2021-05-16 DIAGNOSIS — J309 Allergic rhinitis, unspecified: Secondary | ICD-10-CM

## 2021-06-11 ENCOUNTER — Ambulatory Visit: Payer: Medicare Other | Admitting: Family

## 2021-11-10 ENCOUNTER — Telehealth: Payer: Self-pay | Admitting: Family

## 2021-11-10 NOTE — Telephone Encounter (Signed)
I called patient to schedule AWV.  Patient said he has new PCP, Dr. Orpah Greek, [facility].  Please remove PCP.

## 2021-12-24 NOTE — Progress Notes (Signed)
FOLLOW UP Date of Service/Encounter:  12/25/21   Subjective:  [name] (DOB: [birth year]) is a 60 y.o. male who returns to the [facility] [place] on 12/25/2021 in re-evaluation of the following: Asthma, allergic rhinitis and conjunctivitis on allergen immunotherapy History obtained from: chart review and patient.  For Review, LV was on 03/24/21  with Gareth Morgan, FNP seen for asthma controlled on Symbicort, Spiriva, montelukast and allergic rhinitis and conjunctivitis controlled on allergy shots per protocol.  Currently he is in maintenance vial is coming every 2 weeks.  His asthma is managed at the [place].  Pertinent history/diagnostics.  Allergen immunotherapy started on 07/03/2019 (vial 1-grasses, ragweed, trees; vial 2-mold).  His last shot was on 05/16/21.    Today he presents for follow-up. His asthma has been overall well-controlled.  He has a few episodes where he has needed the rescue inhaler-experienced chest tightness and heaviness while doing yard work a few weeks ago and was cleaning up leaves.  His wife is a Marine scientist and checked his oxygen which was 96%.  She gave him albuterol and this did help him. He is allergic to molds.  He has only used the albuterol only one other time this year.   He does not use his controller inhalers on a daily basis.  He did start them back a few weeks ago when he had a flare doing yard work, but has not needed his rescue inhaler since.   He was tolerating the allergy shots well, never had any issues.  He did feel his symptoms were improving when he was on shots, but he stopped coming around [place] Day due to personal reasons. His conjunctivitis and scratchy throat symptoms had almost resolved when he was coming regularly.  They are slowly starting to return. He is using Allergra daily along with fFlonase as needed.   Allergies as of 12/25/2021       Reactions   Hydrocodone Itching   Niacin And Related Hives        Medication List         Accurate as of December 25, 2021  7:40 PM. If you have any questions, ask your nurse or doctor.          STOP taking these medications    hydrochlorothiazide 25 MG tablet Commonly known as: HYDRODIURIL Stopped by: Sigurd Sos, MD   INSULIN SYRINGE .3CC/31GX5/16" 31G X 5/16" 0.3 ML Misc Stopped by: Sigurd Sos, MD   Spiriva Respimat 1.25 MCG/ACT Aers Generic drug: Tiotropium Bromide Monohydrate Stopped by: Sigurd Sos, MD       TAKE these medications    albuterol 108 (90 Base) MCG/ACT inhaler Commonly known as: VENTOLIN HFA Inhale 2 puffs into the lungs every 6 (six) hours as needed for wheezing or shortness of breath. Started by: Sigurd Sos, MD   ALBUTEROL IN Inhale 2 puffs into the lungs as needed.   atorvastatin 80 MG tablet Commonly known as: LIPITOR Take 80 mg by mouth daily.   budesonide-formoterol 160-4.5 MCG/ACT inhaler Commonly known as: Symbicort 2 puffs twice daily with spacer to prevent coughing or wheezing   Cholecalciferol 50 MCG (2000 UT) Tabs Take by mouth daily.   citalopram 40 MG tablet Commonly known as: CELEXA Take 40 mg by mouth daily.   empagliflozin 25 MG Tabs tablet Commonly known as: JARDIANCE Take 25 mg by mouth daily.   EPINEPHrine 0.3 mg/0.3 mL Soaj injection Commonly known as: EpiPen 2-Pak Inject 0.3 mg into the muscle as needed for  anaphylaxis.   fexofenadine 180 MG tablet Commonly known as: ALLEGRA Take 1 tablet (180 mg total) by mouth daily.   Fiber 625 MG Tabs Take by mouth as needed.   Fluticasone-Salmeterol 250-50 MCG/DOSE Aepb Commonly known as: Advair Diskus Inhale 1 puff into the lungs 2 (two) times daily.   gabapentin 600 MG tablet Commonly known as: NEURONTIN Take 600 mg by mouth 2 (two) times daily.   glucagon 1 MG Solr injection Commonly known as: GLUCAGEN Inject 1 mg into the vein once as needed for low blood sugar.   insulin aspart protamine- aspart (70-30) 100 UNIT/ML injection Commonly  known as: NOVOLOG MIX 70/30 Take 25 units SQ in AM and 25 units SQ in PM.  Hold if glucose <150   ketotifen 0.025 % ophthalmic solution Commonly known as: ZADITOR Place 1 drop into both eyes 2 (two) times daily.   levothyroxine 75 MCG tablet Commonly known as: SYNTHROID Take 1 tablet (75 mcg total) by mouth daily.   Lidocaine HCl 4 % Liqd Apply 1 application topically daily as needed.   losartan 25 MG tablet Commonly known as: COZAAR Take 100 mg by mouth daily.   metFORMIN 500 MG 24 hr tablet Commonly known as: GLUCOPHAGE-XR Take 1,000 mg by mouth 2 (two) times daily.   montelukast 10 MG tablet Commonly known as: SINGULAIR Take 1 tablet (10 mg total) by mouth at bedtime.   NON FORMULARY Allergen immunotherapy   ondansetron 4 MG tablet Commonly known as: ZOFRAN Take 1 tablet (4 mg total) by mouth every 6 (six) hours as needed for nausea or vomiting.   ONE TOUCH ULTRA TEST test strip Generic drug: glucose blood USE AS INSTRUCTED TO CHECK BLOOD SUGAR THREE TIMES DAILY.   onetouch ultrasoft lancets Use as instructed   pantoprazole 40 MG tablet Commonly known as: PROTONIX Take 40 mg by mouth daily.   Pazeo 0.7 % Soln Generic drug: Olopatadine HCl Apply 1 drop to eye daily as needed (itchy/watery eyes).   pramipexole 0.5 MG tablet Commonly known as: MIRAPEX Take 0.5 mg by mouth 3 (three) times daily.   Semaglutide(0.25 or 0.5MG /DOS) 2 MG/1.5ML Sopn Inject 0.5 mg into the skin once a week.   tadalafil 20 MG tablet Commonly known as: CIALIS Take 20 mg by mouth daily as needed for erectile dysfunction.   tamsulosin 0.4 MG Caps capsule Commonly known as: FLOMAX TAKE 1 CAPSULE (0.4 MG TOTAL) BY MOUTH DAILY.   testosterone cypionate 200 MG/ML injection Commonly known as: DEPOTESTOSTERONE CYPIONATE Inject 200 mg into the muscle every 14 (fourteen) days.   urea 10 % cream Commonly known as: CARMOL Apply topically as needed.       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  ROS: All others negative except as noted per HPI.   Objective:  BP (!) 160/90    Pulse 87    Temp (!) 97.4 F (36.3 C) (Temporal)    Resp 18    SpO2 97%  There is no height or weight on file to calculate BMI. Physical Exam: General Appearance:  Alert, cooperative, no distress, appears stated age  Head:  Normocephalic, without obvious abnormality, atraumatic  Eyes:  Conjunctiva clear, EOM's intact  Nose: Nares normal, hypertrophic turbinates and no visible anterior polyps  Throat: Lips, tongue normal; teeth and gums normal, normal posterior oropharynx  Neck: Supple, symmetrical  Lungs:   clear to auscultation bilaterally, Respirations unlabored, no coughing  Heart:  regular rate and rhythm and no murmur, Appears well perfused  Extremities: No edema  Skin: Skin color, texture, turgor normal, no rashes or lesions on visualized portions of skin   Neurologic: No gross deficits    Spirometry:  Tracings reviewed. His effort: Good reproducible efforts. FVC: 3.07L FEV1: 2.51L, 91% predicted FEV1/FVC ratio: 103% Interpretation: Spirometry consistent with normal pattern.  Please see scanned spirometry results for details.  Assessment/Plan   Patient Instructions  Moderate Persistent Asthma-controlled Lung testing looked good For asthma flares or upper respiratory illness:  --Start Symbicort 1 puff twice a day with a spacer to prevent cough or wheeze --Start Spiriva 1.25-2 puffs once a day to prevent cough or wheeze ---continue for at least one week or until symptoms resolve (whichever is longer) Continue montelukast 10 mg once a day to prevent cough or wheeze Continue albuterol 2 puffs every 4 hours as needed for cough or wheeze  Allergic rhinitis Continue avoidance measures directed toward pollens and mold as listed below RESTART allergy shots - will need to restart from beginning Continue Allegra once a day as needed for a runny nose or itch.  Continue Flonase 2 sprays in each nostril once a day for nasal congestion. Continue saline nasal rinses as needed for nasal symptoms. Use this before any medicated nasal sprays for best result  Allergic conjunctivitis Continue Pazeo one drop in each eye once a day as needed for red, itchy eyes.   Call the clinic if this treatment plan is not working well for you  Come back in around 3 weeks for allergy shots.  Follow up in 4 months or sooner if needed.  Sigurd Sos, MD  [facility]

## 2021-12-25 ENCOUNTER — Other Ambulatory Visit: Payer: Self-pay

## 2021-12-25 ENCOUNTER — Ambulatory Visit (INDEPENDENT_AMBULATORY_CARE_PROVIDER_SITE_OTHER): Payer: No Typology Code available for payment source | Admitting: Internal Medicine

## 2021-12-25 ENCOUNTER — Encounter: Payer: Self-pay | Admitting: Internal Medicine

## 2021-12-25 VITALS — BP 160/90 | HR 87 | Temp 97.4°F | Resp 18

## 2021-12-25 DIAGNOSIS — J309 Allergic rhinitis, unspecified: Secondary | ICD-10-CM | POA: Diagnosis not present

## 2021-12-25 DIAGNOSIS — H1013 Acute atopic conjunctivitis, bilateral: Secondary | ICD-10-CM

## 2021-12-25 DIAGNOSIS — J4540 Moderate persistent asthma, uncomplicated: Secondary | ICD-10-CM | POA: Diagnosis not present

## 2021-12-25 DIAGNOSIS — H1010 Acute atopic conjunctivitis, unspecified eye: Secondary | ICD-10-CM

## 2021-12-25 MED ORDER — PAZEO 0.7 % OP SOLN: 1.0000 [drp] | Freq: Every day | OPHTHALMIC | 5 refills | Status: DC | PRN

## 2021-12-25 MED ORDER — EPINEPHRINE 0.3 MG/0.3ML IJ SOAJ: 0.3000 mg | INTRAMUSCULAR | 2 refills | Status: DC | PRN

## 2021-12-25 MED ORDER — FEXOFENADINE HCL 180 MG PO TABS: 180.0000 mg | ORAL_TABLET | Freq: Every day | ORAL | 5 refills | Status: DC

## 2021-12-25 MED ORDER — ALBUTEROL SULFATE HFA 108 (90 BASE) MCG/ACT IN AERS: 2.0000 | INHALATION_SPRAY | Freq: Four times a day (QID) | RESPIRATORY_TRACT | 2 refills | Status: AC | PRN

## 2021-12-25 MED ORDER — BUDESONIDE-FORMOTEROL FUMARATE 160-4.5 MCG/ACT IN AERO: INHALATION_SPRAY | RESPIRATORY_TRACT | 5 refills | Status: DC

## 2021-12-25 MED ORDER — MONTELUKAST SODIUM 10 MG PO TABS: 10.0000 mg | ORAL_TABLET | Freq: Every day | ORAL | 4 refills | Status: DC

## 2021-12-25 NOTE — Patient Instructions (Addendum)
Moderate Persistent Asthma- Lung testing looked good For asthma flares or upper respiratory illness:  Start Symbicort 1 puff twice a day with a spacer to prevent cough or wheeze Start Spiriva 1.25-2 puffs once a day to prevent cough or wheeze ---continue for at least one week or until symptoms resolve (whichever is longer) Continue montelukast 10 mg once a day to prevent cough or wheeze Continue albuterol 2 puffs every 4 hours as needed for cough or wheeze  Allergic rhinitis Continue avoidance measures directed toward pollens and mold as listed below RESTART allergy shots - will need to restart from beginning Continue Allegra once a day as needed for a runny nose or itch.  Continue Flonase 2 sprays in each nostril once a day for nasal congestion.  In the right nostril, point the applicator out toward the right ear. In the left nostril, point the applicator out toward the left ear.  If nose bleeds, discontinue and use only nasal saline spray. Continue saline nasal rinses as needed for nasal symptoms. Use this before any medicated nasal sprays for best result  Allergic conjunctivitis Continue Pazeo one drop in each eye once a day as needed for red, itchy eyes.   Call the clinic if this treatment plan is not working well for you  Come back in around 3 weeks for allergy shots.  Follow up in 4 months or sooner if needed. -------------------------------------------------------------------------------- Reducing Pollen Exposure The American Academy of Allergy, Asthma and Immunology suggests the following steps to reduce your exposure to pollen during allergy seasons. Do not hang sheets or clothing out to dry; pollen may collect on these items. Do not mow lawns or spend time around freshly cut grass; mowing stirs up pollen. Keep windows closed at night.  Keep car windows closed while driving. Minimize morning activities outdoors, a time when pollen counts are usually at their highest. Stay  indoors as much as possible when pollen counts or humidity is high and on windy days when pollen tends to remain in the air longer. Use air conditioning when possible.  Many air conditioners have filters that trap the pollen spores. Use a HEPA room air filter to remove pollen form the indoor air you breathe.  Control of Mold Allergen Mold and fungi can grow on a variety of surfaces provided certain temperature and moisture conditions exist.  Outdoor molds grow on plants, decaying vegetation and soil.  The major outdoor mold, Alternaria and Cladosporium, are found in very high numbers during hot and dry conditions.  Generally, a late Summer - Fall peak is seen for common outdoor fungal spores.  Rain will temporarily lower outdoor mold spore count, but counts rise rapidly when the rainy period ends.  The most important indoor molds are Aspergillus and Penicillium.  Dark, humid and poorly ventilated basements are ideal sites for mold growth.  The next most common sites of mold growth are the bathroom and the kitchen.  Outdoor [employer] Use air conditioning and keep windows closed Avoid exposure to decaying vegetation. Avoid leaf raking. Avoid grain handling. Consider wearing a face mask if working in moldy areas.  Indoor Mold Control Maintain humidity below 50%. Clean washable surfaces with 5% bleach solution. Remove sources e.g. Contaminated carpets.

## 2021-12-29 DIAGNOSIS — J301 Allergic rhinitis due to pollen: Secondary | ICD-10-CM | POA: Diagnosis not present

## 2021-12-29 NOTE — Progress Notes (Signed)
VIALS MADE. EXP 12-29-21

## 2021-12-30 DIAGNOSIS — J302 Other seasonal allergic rhinitis: Secondary | ICD-10-CM | POA: Diagnosis not present

## 2022-01-15 ENCOUNTER — Other Ambulatory Visit: Payer: Self-pay

## 2022-01-15 ENCOUNTER — Ambulatory Visit (INDEPENDENT_AMBULATORY_CARE_PROVIDER_SITE_OTHER): Payer: No Typology Code available for payment source

## 2022-01-15 DIAGNOSIS — J309 Allergic rhinitis, unspecified: Secondary | ICD-10-CM

## 2022-01-15 NOTE — Progress Notes (Signed)
Immunotherapy   Patient Details  Name: [name] MRN: [id number] Date of Birth: May 11, 1961  01/15/2022  [name] started injections for Blue 1:100,000 (MOLD and G-RW-T) Following schedule: B  Frequency:1 time per week Epi-Pen:Epi-Pen Available  Consent signed and patient instructions given.   [name] 01/15/2022, 11:28 AM

## 2022-01-19 ENCOUNTER — Other Ambulatory Visit: Payer: Self-pay

## 2022-01-19 MED ORDER — WIXELA INHUB 500-50 MCG/ACT IN AEPB: 1.0000 | INHALATION_SPRAY | Freq: Two times a day (BID) | RESPIRATORY_TRACT | 5 refills | Status: DC

## 2022-01-22 ENCOUNTER — Telehealth: Payer: Self-pay | Admitting: Internal Medicine

## 2022-01-22 MED ORDER — OLOPATADINE HCL 0.1 % OP SOLN: 1.0000 [drp] | Freq: Every day | OPHTHALMIC | 5 refills | Status: DC | PRN

## 2022-01-22 NOTE — Telephone Encounter (Signed)
[facility] [place]

## 2022-01-22 NOTE — Telephone Encounter (Signed)
[facility] NOT [facility] PAZEO, THE SUBSTITUTE FOR THAT IS OLAPATADINE 0.1%

## 2022-01-23 ENCOUNTER — Telehealth: Payer: Self-pay | Admitting: Internal Medicine

## 2022-01-23 NOTE — Telephone Encounter (Signed)
Faxed renewal of authorization to [facility], [phone] and emailed it to  [email or web address].

## 2022-02-05 NOTE — Telephone Encounter (Signed)
Reached out to [facility] to check on status of authorization request. Spoke to Otila Kluver who let me know authorization request has been approved but not yet processed. Tina sent a message to Allergy team to get authorization processed. Otila Kluver stated it should be sent out in the next 48 hours. If I do not get anything she advied me to reach out to the [place] on Monday.   While she was sending message Otila Kluver let me know patient's current authorization does not expire until 03-01-2022. I asked for copy of that authorization with those dates. Otila Kluver stated she would fax that to [phone]. She advised me to reach out to [place] once current authorization expires with first appointment date for new auth.   Will keep an eye out for both authorizations.

## 2022-02-27 ENCOUNTER — Ambulatory Visit (INDEPENDENT_AMBULATORY_CARE_PROVIDER_SITE_OTHER): Payer: No Typology Code available for payment source | Admitting: *Deleted

## 2022-02-27 DIAGNOSIS — J309 Allergic rhinitis, unspecified: Secondary | ICD-10-CM | POA: Diagnosis not present

## 2022-03-04 NOTE — Telephone Encounter (Signed)
[name], ?Have you heard anything back about this? ?J.Tweedy ?

## 2022-03-09 ENCOUNTER — Ambulatory Visit (INDEPENDENT_AMBULATORY_CARE_PROVIDER_SITE_OTHER): Payer: No Typology Code available for payment source | Admitting: *Deleted

## 2022-03-09 DIAGNOSIS — J309 Allergic rhinitis, unspecified: Secondary | ICD-10-CM

## 2022-03-12 NOTE — Telephone Encounter (Signed)
Received updated authorization, [id number] dates of 02-27-2022 to 02-27-2023. I have updated it in our system. ?

## 2022-03-16 ENCOUNTER — Ambulatory Visit (INDEPENDENT_AMBULATORY_CARE_PROVIDER_SITE_OTHER): Payer: No Typology Code available for payment source

## 2022-03-16 DIAGNOSIS — J309 Allergic rhinitis, unspecified: Secondary | ICD-10-CM | POA: Diagnosis not present

## 2022-03-23 ENCOUNTER — Ambulatory Visit (INDEPENDENT_AMBULATORY_CARE_PROVIDER_SITE_OTHER): Payer: No Typology Code available for payment source

## 2022-03-23 DIAGNOSIS — J309 Allergic rhinitis, unspecified: Secondary | ICD-10-CM | POA: Diagnosis not present

## 2022-03-30 ENCOUNTER — Ambulatory Visit (INDEPENDENT_AMBULATORY_CARE_PROVIDER_SITE_OTHER): Payer: No Typology Code available for payment source

## 2022-03-30 DIAGNOSIS — J309 Allergic rhinitis, unspecified: Secondary | ICD-10-CM

## 2022-04-06 ENCOUNTER — Ambulatory Visit (INDEPENDENT_AMBULATORY_CARE_PROVIDER_SITE_OTHER): Payer: No Typology Code available for payment source

## 2022-04-06 DIAGNOSIS — J309 Allergic rhinitis, unspecified: Secondary | ICD-10-CM

## 2022-04-13 ENCOUNTER — Ambulatory Visit (INDEPENDENT_AMBULATORY_CARE_PROVIDER_SITE_OTHER): Payer: No Typology Code available for payment source

## 2022-04-13 DIAGNOSIS — J309 Allergic rhinitis, unspecified: Secondary | ICD-10-CM | POA: Diagnosis not present

## 2022-04-20 ENCOUNTER — Ambulatory Visit (INDEPENDENT_AMBULATORY_CARE_PROVIDER_SITE_OTHER): Payer: No Typology Code available for payment source

## 2022-04-20 DIAGNOSIS — J309 Allergic rhinitis, unspecified: Secondary | ICD-10-CM | POA: Diagnosis not present

## 2022-04-27 ENCOUNTER — Ambulatory Visit (INDEPENDENT_AMBULATORY_CARE_PROVIDER_SITE_OTHER): Payer: No Typology Code available for payment source | Admitting: *Deleted

## 2022-04-27 DIAGNOSIS — J309 Allergic rhinitis, unspecified: Secondary | ICD-10-CM | POA: Diagnosis not present

## 2022-04-28 NOTE — Progress Notes (Deleted)
FOLLOW UP Date of Service/Encounter:  04/28/22   Subjective:  [name] (DOB: 12/31/1960) is a 61 y.o. male who returns to the [facility] [place] on 04/30/2022 in re-evaluation of the following: *** History obtained from: chart review and {Persons; PED relatives w/patient:19415::"patient"}.  For Review, LV was on 12/25/22  with Dr.Elkin Belfield seen for regular follow-up.  We discussed restarting allergy injections as he was getting good benefit but had to quit due to personal reasons.  Pertinent history/diagnostics.  Allergen immunotherapy started on 07/03/2019 (vial 1-grasses, ragweed, trees; vial 2-mold-) stopped on 05/16/21.  Restarted on 02/27/22 using previous prescription.  Today presents for follow-up.    Allergies as of 04/30/2022       Reactions   Hydrocodone Itching   Niacin And Related Hives        Medication List        Accurate as of Apr 28, 2022 12:58 PM. If you have any questions, ask your nurse or doctor.          albuterol 108 (90 Base) MCG/ACT inhaler Commonly known as: VENTOLIN HFA Inhale 2 puffs into the lungs every 6 (six) hours as needed for wheezing or shortness of breath.   ALBUTEROL IN Inhale 2 puffs into the lungs as needed.   atorvastatin 80 MG tablet Commonly known as: LIPITOR Take 80 mg by mouth daily.   budesonide-formoterol 160-4.5 MCG/ACT inhaler Commonly known as: Symbicort 2 puffs twice daily with spacer to prevent coughing or wheezing   Cholecalciferol 50 MCG (2000 UT) Tabs Take by mouth daily.   citalopram 40 MG tablet Commonly known as: CELEXA Take 40 mg by mouth daily.   empagliflozin 25 MG Tabs tablet Commonly known as: JARDIANCE Take 25 mg by mouth daily.   EPINEPHrine 0.3 mg/0.3 mL Soaj injection Commonly known as: EpiPen 2-Pak Inject 0.3 mg into the muscle as needed for anaphylaxis.   fexofenadine 180 MG tablet Commonly known as: ALLEGRA Take 1 tablet (180 mg total) by mouth daily.   Fiber 625 MG  Tabs Take by mouth as needed.   Fluticasone-Salmeterol 250-50 MCG/DOSE Aepb Commonly known as: Advair Diskus Inhale 1 puff into the lungs 2 (two) times daily.   Wixela Inhub 500-50 MCG/ACT Aepb Generic drug: fluticasone-salmeterol Inhale 1 puff into the lungs in the morning and at bedtime.   gabapentin 600 MG tablet Commonly known as: NEURONTIN Take 600 mg by mouth 2 (two) times daily.   glucagon 1 MG Solr injection Commonly known as: GLUCAGEN Inject 1 mg into the vein once as needed for low blood sugar.   insulin aspart protamine- aspart (70-30) 100 UNIT/ML injection Commonly known as: NOVOLOG MIX 70/30 Take 25 units SQ in AM and 25 units SQ in PM.  Hold if glucose <150   ketotifen 0.025 % ophthalmic solution Commonly known as: ZADITOR Place 1 drop into both eyes 2 (two) times daily.   levothyroxine 75 MCG tablet Commonly known as: SYNTHROID Take 1 tablet (75 mcg total) by mouth daily.   Lidocaine HCl 4 % Liqd Apply 1 application topically daily as needed.   losartan 25 MG tablet Commonly known as: COZAAR Take 100 mg by mouth daily.   metFORMIN 500 MG 24 hr tablet Commonly known as: GLUCOPHAGE-XR Take 1,000 mg by mouth 2 (two) times daily.   montelukast 10 MG tablet Commonly known as: SINGULAIR Take 1 tablet (10 mg total) by mouth at bedtime.   NON FORMULARY Allergen immunotherapy   ondansetron 4 MG tablet Commonly known  as: ZOFRAN Take 1 tablet (4 mg total) by mouth every 6 (six) hours as needed for nausea or vomiting.   ONE TOUCH ULTRA TEST test strip Generic drug: glucose blood USE AS INSTRUCTED TO CHECK BLOOD SUGAR THREE TIMES DAILY.   onetouch ultrasoft lancets Use as instructed   pantoprazole 40 MG tablet Commonly known as: PROTONIX Take 40 mg by mouth daily.   Pazeo 0.7 % Soln Generic drug: Olopatadine HCl Apply 1 drop to eye daily as needed (itchy/watery eyes).   olopatadine 0.1 % ophthalmic solution Commonly known as: PATANOL Place 1  drop into both eyes daily as needed for allergies.   pramipexole 0.5 MG tablet Commonly known as: MIRAPEX Take 0.5 mg by mouth 3 (three) times daily.   Semaglutide(0.25 or 0.'5MG'$ /DOS) 2 MG/1.5ML Sopn Inject 0.5 mg into the skin once a week.   tadalafil 20 MG tablet Commonly known as: CIALIS Take 20 mg by mouth daily as needed for erectile dysfunction.   tamsulosin 0.4 MG Caps capsule Commonly known as: FLOMAX TAKE 1 CAPSULE (0.4 MG TOTAL) BY MOUTH DAILY.   testosterone cypionate 200 MG/ML injection Commonly known as: DEPOTESTOSTERONE CYPIONATE Inject 200 mg into the muscle every 14 (fourteen) days.   urea 10 % cream Commonly known as: CARMOL Apply topically as needed.       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  ROS: All others negative except as noted per HPI.   Objective:  There were no vitals taken for this visit. There is no height or weight on  file to calculate BMI. Physical Exam: General Appearance:  Alert, cooperative, no distress, appears stated age  Head:  Normocephalic, without obvious abnormality, atraumatic  Eyes:  Conjunctiva clear, EOM's intact  Nose: Nares normal, {Blank multiple:19196:a:"***","hypertrophic turbinates","normal mucosa","no visible anterior polyps","septum midline"}  Throat: Lips, tongue normal; teeth and gums normal, {Blank multiple:19196:a:"***","normal posterior oropharynx","tonsils 2+","tonsils 3+","no tonsillar exudate","+ cobblestoning"}  Neck: Supple, symmetrical  Lungs:   {Blank multiple:19196:a:"***","clear to auscultation bilaterally","end-expiratory wheezing","wheezing throughout"}, Respirations unlabored, {Blank multiple:19196:a:"***","no coughing","intermittent dry coughing"}  Heart:  {Blank multiple:19196:a:"***","regular rate and rhythm","no murmur"}, Appears well perfused  Extremities: No edema  Skin: Skin color, texture, turgor normal, no rashes or  lesions on visualized portions of skin  Neurologic: No gross deficits   Reviewed: ***  Spirometry:  Tracings reviewed. His effort: {Blank single:19197::"Good reproducible efforts.","It was hard to get consistent efforts and there is a question as to whether this reflects a maximal maneuver.","Poor effort, data can not be interpreted.","Variable effort-results affected.","decent for first attempt at spirometry."} FVC: ***L FEV1: ***L, ***% predicted FEV1/FVC ratio: ***% Interpretation: {Blank single:19197::"Spirometry consistent with mild obstructive disease","Spirometry consistent with moderate obstructive disease","Spirometry consistent with severe obstructive disease","Spirometry consistent with possible restrictive disease","Spirometry consistent with mixed obstructive and restrictive disease","Spirometry uninterpretable due to technique","Spirometry consistent with normal pattern","No overt abnormalities noted given today's efforts"}.  Please  see scanned spirometry results for details.  Skin Testing: {Blank single:19197::"Select foods","Environmental allergy panel","Environmental allergy panel and select foods","Food allergy panel","None","Deferred due to recent antihistamines use"}. Positive test to: ***. Negative test to: ***.  Results discussed with patient/family.   {Blank single:19197::"Allergy testing results were read and interpreted by myself, documented by clinical staff."," "}  Assessment/Plan   ***  Sigurd Sos, MD  [facility] [place] [facility]

## 2022-04-30 ENCOUNTER — Ambulatory Visit: Payer: Medicare (Managed Care) | Admitting: Internal Medicine

## 2022-05-04 ENCOUNTER — Ambulatory Visit (INDEPENDENT_AMBULATORY_CARE_PROVIDER_SITE_OTHER): Payer: No Typology Code available for payment source

## 2022-05-04 DIAGNOSIS — J309 Allergic rhinitis, unspecified: Secondary | ICD-10-CM | POA: Diagnosis not present

## 2022-05-06 ENCOUNTER — Ambulatory Visit (INDEPENDENT_AMBULATORY_CARE_PROVIDER_SITE_OTHER): Payer: No Typology Code available for payment source | Admitting: Internal Medicine

## 2022-05-06 ENCOUNTER — Encounter: Payer: Self-pay | Admitting: Internal Medicine

## 2022-05-06 VITALS — BP 140/90 | HR 86 | Temp 97.9°F | Resp 16

## 2022-05-06 DIAGNOSIS — H1010 Acute atopic conjunctivitis, unspecified eye: Secondary | ICD-10-CM

## 2022-05-06 DIAGNOSIS — H1013 Acute atopic conjunctivitis, bilateral: Secondary | ICD-10-CM

## 2022-05-06 DIAGNOSIS — J3089 Other allergic rhinitis: Secondary | ICD-10-CM | POA: Diagnosis not present

## 2022-05-06 DIAGNOSIS — J4540 Moderate persistent asthma, uncomplicated: Secondary | ICD-10-CM

## 2022-05-06 NOTE — Progress Notes (Signed)
? ?Follow Up Note ? ?RE: [name] MRN: [id number] DOB: May 22, 1961 ?Date of Office Visit: 05/06/2022 ? ?Referring provider: [name], NP ?Primary care provider: [name], NP ? ?Chief Complaint: Asthma ? ?History of Present Illness: ?I had the pleasure of seeing [name] for a follow up visit at the [facility] on 05/06/2022. He is a 61 y.o. male, who is being followed for Asthma, allergic rhinitis and conjunctivitis on allergen immunotherapy. His previous allergy office visit was on 12/25/21 with  Dr. Simona Huh . Today is a regular follow up visit. ? ?ASTHMA ?- Medical therapy: Wixela 592mg 1 puff twice daily,  ?- Rescue inhaler use: 8 times in the past month ?- Symptoms: chest tightness, coughing, wheezing  ?- Exacerbation history: 0 ABX for respiratory illness since last visit, 0 OCS, 0 ED, 0 UC visits in the past year  ?- ACT: 17 /25 ?- Adverse effects of medication: denies  ?- Previous FEV1: 2.51 L, 91% ? ?Allergic Rhinitis ?- Medical therapy: allegra daily, flonase 2 sprays daily (needs refill)  patanol eye drops as needed  ?- Symptoms: ocular itchy, rhinnrohea, epiphora, exacerbated with yardwork  ?- Adverse effects of medications: denies  ?- Immunotherapy: Allergen immunotherapy started on 07/03/2019 (vial 1-grasses, ragweed, trees; vial 2-mold).  last injection 05/04/22 1:10,0000 0.447m previously on maintenance but quit due to logisitics on 5/22 - restarted 1/23 ?- Large Local Reactions: denies  ?- Systemic Reactions: denies  ?- Beta Blockers: denies  ?- History of Reflux Yes  ?- History of Sinus Surgery Yes - deviated septum repair and rhinoplasty ? ?Assessment and Plan: ?FeDiamonds a 89.o. male with: ?Moderate persistent asthma without complication ? ?Other allergic rhinitis ? ?Seasonal allergic conjunctivitis ?Plan: ?Patient Instructions  ?Moderate Persistent  Asthma: not well controlled  ?- Based on symptoms your asthma is not well controlled  and  we need to step up care  ?  ?PLAN:  ?- We will get spirometry at next visit  ?- Daily controller medication(s): Singulair '10mg'$  daily and Trelegy 100/62.5/25 one puff once daily ?- Prior to physical activity: albuterol 2 puffs 10-15 minutes before physical activity. ?- Rescue medications: albuterol 4 puffs every 4-6 hours as needed ?- Get Influenza Vaccine and appropriate Pneumonia and COVID 19 boosters  ?- Asthma control goals:  ?* Full participation in all desired activities (may need albuterol before activity) ?* Albuterol use two time or less a week on average (not counting use with activity) ?* Cough interfering with sleep two time or less a month ?* Oral steroids no more than once a year ?* No hospitalizations ?  ?Allergic  Rhinitis: not well controlled  ?-  Continue Avoidance measures  ?- Continue with: Allegra (fexofenadine) '180mg'$  table once daily, Flonase (fluticasone) two sprays per nostril daily, and Pataday (olopatadine) one drop per eye twice daily as needed ?-  continue Allergy Injections  ?  ?Allergic Conjunctivitis ?- Continue Allergen avoidance as instructed ?- Avoiding rubbing eyes, if irritated use a wet wash cloth to wipe allergen out of eyes  ?- Continue  great options include Pataday (Olopatadine)  eye drops  ? -Avoid eye drops that say red eye relief as they may contain medications that dry out your eyes. ?- Consider allergen immunotherapy if symptoms worsen or you desire to reduce lifetime use of medications.  ?  ?Follow up: 4 weeks  ?  ?Thank you so much for letting me partake in your care today.  Don't hesitate to reach out  if you have any additional concerns! ?  ?[name], MD  ?[facility] [place] [place] ?  ?Return in about 4 weeks (around 06/03/2022). ? ?No orders of the defined types were placed in this encounter. ? ? ?Lab Orders  ?No laboratory test(s) ordered today  ? ?Diagnostics: ?None performed  ? ? ?Medication List:  ?Current Outpatient Medications  ?Medication  Sig Dispense Refill  ? albuterol (VENTOLIN HFA) 108 (90 Base) MCG/ACT inhaler Inhale 2 puffs into the lungs every 6 (six) hours as needed for wheezing or shortness of breath. 8 g 2  ? ALBUTEROL IN Inhale 2 puffs into the lungs as needed.    ? atorvastatin (LIPITOR) 80 MG tablet Take 80 mg by mouth daily.    ? budesonide-formoterol (SYMBICORT) 160-4.5 MCG/ACT inhaler 2 puffs twice daily with spacer to prevent coughing or wheezing 1 each 5  ? Calcium Polycarbophil (FIBER) 625 MG TABS Take by mouth as needed.    ? Cholecalciferol 50 MCG (2000 UT) TABS Take by mouth daily.    ? citalopram (CELEXA) 40 MG tablet Take 40 mg by mouth daily.    ? empagliflozin (JARDIANCE) 25 MG TABS tablet Take 25 mg by mouth daily.    ? EPINEPHrine (EPIPEN 2-PAK) 0.3 mg/0.3 mL IJ SOAJ injection Inject 0.3 mg into the muscle as needed for anaphylaxis. 1 each 2  ? Fluticasone-Salmeterol (ADVAIR DISKUS) 250-50 MCG/DOSE AEPB Inhale 1 puff into the lungs 2 (two) times daily.  3  ? gabapentin (NEURONTIN) 600 MG tablet Take 600 mg by mouth 2 (two) times daily.     ? glucagon (GLUCAGEN) 1 MG SOLR injection Inject 1 mg into the vein once as needed for low blood sugar.    ? ketotifen (ZADITOR) 0.025 % ophthalmic solution Place 1 drop into both eyes 2 (two) times daily.     ? Lancets (ONETOUCH ULTRASOFT) lancets Use as instructed 100 each 2  ? levothyroxine (SYNTHROID) 75 MCG tablet Take 1 tablet (75 mcg total) by mouth daily. 30 tablet 3  ? Lidocaine HCl 4 % LIQD Apply 1 application topically daily as needed.    ? losartan (COZAAR) 25 MG tablet Take 100 mg by mouth daily.     ? metFORMIN (GLUCOPHAGE-XR) 500 MG 24 hr tablet Take 1,000 mg by mouth 2 (two) times daily.     ? montelukast (SINGULAIR) 10 MG tablet Take 1 tablet (10 mg total) by mouth at bedtime. 30 tablet 4  ? NON FORMULARY Allergen immunotherapy    ? olopatadine (PATANOL) 0.1 % ophthalmic solution Place 1 drop into both eyes daily as needed for allergies. 5 mL 5  ? ondansetron (ZOFRAN) 4  MG tablet Take 1 tablet (4 mg total) by mouth every 6 (six) hours as needed for nausea or vomiting. 30 tablet 1  ? ONE TOUCH ULTRA TEST test strip USE AS INSTRUCTED TO CHECK BLOOD SUGAR THREE TIMES DAILY. 100 each 5  ? pantoprazole (PROTONIX) 40 MG tablet Take 40 mg by mouth daily.    ? pramipexole (MIRAPEX) 0.5 MG tablet Take 0.5 mg by mouth 3 (three) times daily.    ? Semaglutide,0.25 or 0.'5MG'$ /DOS, 2 MG/1.5ML SOPN Inject 0.5 mg into the skin once a week.    ? tadalafil (CIALIS) 20 MG tablet Take 20 mg by mouth daily as needed for erectile dysfunction.    ? tamsulosin (FLOMAX) 0.4 MG CAPS capsule TAKE 1 CAPSULE (0.4 MG TOTAL) BY MOUTH DAILY. 30 capsule 5  ? testosterone cypionate (DEPOTESTOSTERONE CYPIONATE)  200 MG/ML injection Inject 200 mg into the muscle every 14 (fourteen) days.    ? urea (CARMOL) 10 % cream Apply topically as needed.    ? WIXELA INHUB 500-50 MCG/ACT AEPB Inhale 1 puff into the lungs in the morning and at bedtime. 60 each 5  ? fexofenadine (ALLEGRA) 180 MG tablet Take 1 tablet (180 mg total) by mouth daily. 31 tablet 5  ? insulin aspart protamine- aspart (NOVOLOG MIX 70/30) (70-30) 100 UNIT/ML injection Take 25 units SQ in AM and 25 units SQ in PM.  Hold if glucose <150 (Patient not taking: Reported on 05/06/2022)    ? ?No current facility-administered medications for this visit.  ? ?Allergies: ?Allergies  ?Allergen Reactions  ? Hydrocodone Itching  ? Niacin And Related Hives  ? ?I reviewed his past medical history, social history, family history, and environmental history and no significant changes have been reported from his previous visit. ? ?ROS: All others negative except as noted per HPI.  ? ?Objective: ?BP 140/90   Pulse 86   Temp 97.9 ?F (36.6 ?C) (Temporal)   Resp 16   SpO2 96%  ?There is no height or weight on file to calculate BMI. ?General Appearance:  Alert, cooperative, no distress, appears stated age  ?Head:  Normocephalic, without obvious abnormality, atraumatic  ?Eyes:   Conjunctiva slightly erythematous , EOM's intact  ?Nose: Nares normal,  erythematous nasal mucosa, no visible anterior polyps, and septum midline  ?Throat: Lips, tongue normal; teeth and gums normal, normal

## 2022-05-06 NOTE — Patient Instructions (Signed)
Moderate Persistent  Asthma: not well controlled  ?- Based on symptoms your asthma is not well controlled  and we need to step up care  ?  ?PLAN:  ?- We will get spirometry at next visit  ?- Daily controller medication(s): Singulair '10mg'$  daily and Trelegy 100/62.5/25 one puff once daily ?- Prior to physical activity: albuterol 2 puffs 10-15 minutes before physical activity. ?- Rescue medications: albuterol 4 puffs every 4-6 hours as needed ?- Get Influenza Vaccine and appropriate Pneumonia and COVID 19 boosters  ?- Asthma control goals:  ?* Full participation in all desired activities (may need albuterol before activity) ?* Albuterol use two time or less a week on average (not counting use with activity) ?* Cough interfering with sleep two time or less a month ?* Oral steroids no more than once a year ?* No hospitalizations ?  ?Allergic  Rhinitis: not well controlled  ?-  Continue Avoidance measures  ?- Continue with: Allegra (fexofenadine) '180mg'$  table once daily, Flonase (fluticasone) two sprays per nostril daily, and Pataday (olopatadine) one drop per eye twice daily as needed ?-  continue Allergy Injections  ?  ?Allergic Conjunctivitis ?- Continue Allergen avoidance as instructed ?- Avoiding rubbing eyes, if irritated use a wet wash cloth to wipe allergen out of eyes  ?- Continue  great options include Pataday (Olopatadine)  eye drops  ? -Avoid eye drops that say red eye relief as they may contain medications that dry out your eyes. ?- Consider allergen immunotherapy if symptoms worsen or you desire to reduce lifetime use of medications.  ?  ?Follow up: 4 weeks  ?  ?Thank you so much for letting me partake in your care today.  Don't hesitate to reach out if you have any additional concerns! ?  ?[name], MD  ?[facility] [place] [place] ?  ?

## 2022-05-19 ENCOUNTER — Ambulatory Visit (INDEPENDENT_AMBULATORY_CARE_PROVIDER_SITE_OTHER): Payer: No Typology Code available for payment source

## 2022-05-19 DIAGNOSIS — J309 Allergic rhinitis, unspecified: Secondary | ICD-10-CM | POA: Diagnosis not present

## 2022-05-27 ENCOUNTER — Ambulatory Visit (INDEPENDENT_AMBULATORY_CARE_PROVIDER_SITE_OTHER): Payer: Medicare (Managed Care)

## 2022-05-27 DIAGNOSIS — J309 Allergic rhinitis, unspecified: Secondary | ICD-10-CM | POA: Diagnosis not present

## 2022-06-03 ENCOUNTER — Ambulatory Visit (INDEPENDENT_AMBULATORY_CARE_PROVIDER_SITE_OTHER): Payer: No Typology Code available for payment source

## 2022-06-03 DIAGNOSIS — J309 Allergic rhinitis, unspecified: Secondary | ICD-10-CM

## 2022-06-09 ENCOUNTER — Ambulatory Visit (INDEPENDENT_AMBULATORY_CARE_PROVIDER_SITE_OTHER): Payer: No Typology Code available for payment source

## 2022-06-09 DIAGNOSIS — J309 Allergic rhinitis, unspecified: Secondary | ICD-10-CM

## 2022-06-13 ENCOUNTER — Emergency Department (HOSPITAL_BASED_OUTPATIENT_CLINIC_OR_DEPARTMENT_OTHER): Payer: Medicare (Managed Care)

## 2022-06-13 ENCOUNTER — Emergency Department (HOSPITAL_BASED_OUTPATIENT_CLINIC_OR_DEPARTMENT_OTHER)
Admission: EM | Admit: 2022-06-13 | Discharge: 2022-06-13 | Disposition: A | Discharge status: Home / Self Care | Payer: Medicare (Managed Care) | Attending: Emergency Medicine | Admitting: Emergency Medicine

## 2022-06-13 ENCOUNTER — Other Ambulatory Visit: Payer: Self-pay

## 2022-06-13 ENCOUNTER — Encounter (HOSPITAL_BASED_OUTPATIENT_CLINIC_OR_DEPARTMENT_OTHER): Payer: Self-pay | Admitting: Emergency Medicine

## 2022-06-13 ENCOUNTER — Telehealth (HOSPITAL_BASED_OUTPATIENT_CLINIC_OR_DEPARTMENT_OTHER): Payer: Self-pay | Admitting: Emergency Medicine

## 2022-06-13 DIAGNOSIS — X501XXA Overexertion from prolonged static or awkward postures, initial encounter: Secondary | ICD-10-CM | POA: Diagnosis not present

## 2022-06-13 DIAGNOSIS — M25562 Pain in left knee: Secondary | ICD-10-CM | POA: Diagnosis present

## 2022-06-13 DIAGNOSIS — Z96652 Presence of left artificial knee joint: Secondary | ICD-10-CM | POA: Diagnosis not present

## 2022-06-13 DIAGNOSIS — Y9302 Activity, running: Secondary | ICD-10-CM | POA: Insufficient documentation

## 2022-06-13 DIAGNOSIS — R000 Tachycardia, unspecified: Secondary | ICD-10-CM | POA: Insufficient documentation

## 2022-06-13 DIAGNOSIS — Z794 Long term (current) use of insulin: Secondary | ICD-10-CM | POA: Diagnosis not present

## 2022-06-13 LAB — CBG MONITORING, ED: Glucose-Capillary: 149 mg/dL — ABNORMAL HIGH (ref 70–99)

## 2022-06-13 MED ORDER — OXYCODONE-ACETAMINOPHEN 5-325 MG PO TABS: 1.0000 | ORAL_TABLET | Freq: Four times a day (QID) | ORAL | 0 refills | Status: DC | PRN

## 2022-06-13 MED ORDER — OXYCODONE-ACETAMINOPHEN 5-325 MG PO TABS
2.0000 | ORAL_TABLET | Freq: Once | ORAL | Status: AC
  Administered 2022-06-13: 2 via ORAL
  Filled 2022-06-13: qty 2

## 2022-06-13 NOTE — Discharge Instructions (Signed)
Your exam overall was reassuring.  X-ray did not show any concerning findings.  You received a dose of pain medication with some improvement in pain.  Continue to ice this area.  You received knee immobilizer, and crutches.  Follow-up with your orthopedist.  I have also attached information for Dr. Raeford Razor listed above if you choose to follow-up with a new sports medicine doctor.

## 2022-06-13 NOTE — ED Notes (Signed)
Pt verbalizes understanding of discharge instructions. Opportunity for questioning and answers were provided. Pt discharged from ED to home.   ? ?

## 2022-06-13 NOTE — Telephone Encounter (Signed)
Did not send patient's pain medication to the pharmacy during the initial encounter.  Encounter created to send patient's Percocet to the pharmacy.

## 2022-06-13 NOTE — ED Triage Notes (Signed)
Pt arrives pov, to triage in wheelchair, c/o left knee pain after leg "buckled" today. Decreased ROM, weakness to LLE. Denies otc meds pta

## 2022-06-13 NOTE — ED Provider Notes (Signed)
[facility] Provider Note   CSN: [id number] Arrival date & time: 06/13/22  1859     History  Chief Complaint  Patient presents with   Knee Pain    [name] is a 61 y.o. male.  61 year old male presents today for evaluation of left knee pain.  Patient states after he was done fishing and he was walking away he had a misstep, his knee buckled and he had onset of pain.  He states he at baseline has swelling in this left knee however is currently worse.  He states is difficult for him to bear weight.  He has history of left knee replacement.  Follows with Dr. Amalia Hailey at [facility].  He is without fever.  He did not take anything prior to arrival for pain.  The history is provided by the patient. No language interpreter was used.       Home Medications Prior to Admission medications   Medication Sig Start Date End Date Taking? Authorizing Provider  albuterol (VENTOLIN HFA) 108 (90 Base) MCG/ACT inhaler Inhale 2 puffs into the lungs every 6 (six) hours as needed for wheezing or shortness of breath. 12/25/21   Clemon Chambers, MD  ALBUTEROL IN Inhale 2 puffs into the lungs as needed.    [provider]  atorvastatin (LIPITOR) 80 MG tablet Take 80 mg by mouth daily.    [provider]  budesonide-formoterol (SYMBICORT) 160-4.5 MCG/ACT inhaler 2 puffs twice daily with spacer to prevent coughing or wheezing 12/25/21   Clemon Chambers, MD  Calcium Polycarbophil (FIBER) 625 MG TABS Take by mouth as needed.    [provider]  Cholecalciferol 50 MCG (2000 UT) TABS Take by mouth daily. 08/11/13   [provider]  citalopram (CELEXA) 40 MG tablet Take 40 mg by mouth daily.    [provider]  empagliflozin (JARDIANCE) 25 MG TABS tablet Take 25 mg by mouth daily.    [provider]  EPINEPHrine (EPIPEN 2-PAK) 0.3 mg/0.3 mL IJ SOAJ injection Inject 0.3 mg into the muscle as needed for  anaphylaxis. 12/25/21   Clemon Chambers, MD  fexofenadine (ALLEGRA) 180 MG tablet Take 1 tablet (180 mg total) by mouth daily. 12/25/21   Clemon Chambers, MD  Fluticasone-Salmeterol (ADVAIR DISKUS) 250-50 MCG/DOSE AEPB Inhale 1 puff into the lungs 2 (two) times daily. 01/28/21   [name], NP  gabapentin (NEURONTIN) 600 MG tablet Take 600 mg by mouth 2 (two) times daily.     [provider]  glucagon (GLUCAGEN) 1 MG SOLR injection Inject 1 mg into the vein once as needed for low blood sugar.    [provider]  insulin aspart protamine- aspart (NOVOLOG MIX 70/30) (70-30) 100 UNIT/ML injection Take 25 units SQ in AM and 25 units SQ in PM.  Hold if glucose <150 Patient not taking: Reported on 05/06/2022      ketotifen (ZADITOR) 0.025 % ophthalmic solution Place 1 drop into both eyes 2 (two) times daily.     [provider]  Lancets Glory Rosebush ULTRASOFT) lancets Use as instructed 12/10/15   [name], NP  levothyroxine (SYNTHROID) 75 MCG tablet Take 1 tablet (75 mcg total) by mouth daily. 07/24/19   [name], NP  Lidocaine HCl 4 % LIQD Apply 1 application topically daily as needed.    [provider]  losartan (COZAAR) 25 MG tablet Take 100 mg by mouth daily.     [provider]  metFORMIN (GLUCOPHAGE-XR) 500 MG 24 hr tablet Take 1,000 mg by mouth 2 (two) times daily.     [provider]  montelukast (SINGULAIR) 10 MG tablet Take 1 tablet (10 mg total) by mouth at bedtime. 12/25/21   Clemon Chambers, MD  NON FORMULARY Allergen immunotherapy    [provider]  olopatadine (PATANOL) 0.1 % ophthalmic solution Place 1 drop into both eyes daily as needed for allergies. 01/22/22   Clemon Chambers, MD  ondansetron (ZOFRAN) 4 MG tablet Take 1 tablet (4 mg total) by mouth every 6 (six) hours as needed for nausea or vomiting. 03/13/19   Cirigliano, Vito V, DO  ONE TOUCH ULTRA TEST test strip USE AS INSTRUCTED TO CHECK BLOOD SUGAR  THREE TIMES DAILY. 12/29/16   [name], NP  pantoprazole (PROTONIX) 40 MG tablet Take 40 mg by mouth daily.    [provider]  pramipexole (MIRAPEX) 0.5 MG tablet Take 0.5 mg by mouth 3 (three) times daily.    [provider]  Semaglutide,0.25 or 0.'5MG'$ /DOS, 2 MG/1.5ML SOPN Inject 0.5 mg into the skin once a week.    [provider]  tadalafil (CIALIS) 20 MG tablet Take 20 mg by mouth daily as needed for erectile dysfunction.    [provider]  tamsulosin (FLOMAX) 0.4 MG CAPS capsule TAKE 1 CAPSULE (0.4 MG TOTAL) BY MOUTH DAILY. 06/07/19   [name], NP  testosterone cypionate (DEPOTESTOSTERONE CYPIONATE) 200 MG/ML injection Inject 200 mg into the muscle every 14 (fourteen) days.    [provider]  urea (CARMOL) 10 % cream Apply topically as needed.    [provider]  Grant Ruts INHUB 500-50 MCG/ACT AEPB Inhale 1 puff into the lungs in the morning and at bedtime. 01/19/22   Clemon Chambers, MD      Allergies    Hydrocodone and Niacin and related    Review of Systems   Review of Systems  Constitutional:  Negative for chills and fever.  Musculoskeletal:  Positive for arthralgias and joint swelling.  All other systems reviewed and are negative.   Physical Exam Updated Vital Signs BP (!) 145/99 (BP Location: Right Arm)   Pulse (!) 121   Temp 99.7 F (37.6 C) (Oral)   Resp 20   Ht '5\' 4"'$  (1.626 m)   Wt 95.3 kg   SpO2 95%   BMI 36.05 kg/m  Physical Exam Vitals and nursing note reviewed.  Constitutional:      General: He is not in acute distress.    Appearance: Normal appearance. He is not ill-appearing.  HENT:     Head: Normocephalic and atraumatic.     Nose: Nose normal.  Eyes:     Conjunctiva/sclera: Conjunctivae normal.  Cardiovascular:     Rate and Rhythm: Regular rhythm. Tachycardia present.  Pulmonary:     Effort: Pulmonary effort is normal. No respiratory distress.  Musculoskeletal:        General: No  deformity.     Comments: Swelling noted to left knee.  Without erythema or warmth.  Patient able to extend and flex left knee.  Left ankle with full range of motion and without tenderness to palpation.  2+ DP pulse present.   Skin:    Findings: No rash.  Neurological:     Mental Status: He is alert.     ED Results / Procedures / Treatments   Labs (all labs ordered are listed, but only abnormal results are displayed) Labs Reviewed - No data to  display  EKG None  Radiology DG Knee Complete 4 Views Left  Result Date: 06/13/2022 CLINICAL DATA:  Trauma, pain EXAM: LEFT KNEE - COMPLETE 4+ VIEW COMPARISON:  09/26/2021 FINDINGS: No recent fracture or dislocation is seen. There is previous left knee arthroplasty. There is no significant effusion. There is no demonstrable hardware failure. IMPRESSION: Previous left knee arthroplasty. No recent fracture or dislocation is seen. Electronically Signed   By: Elmer Picker M.D.   On: 06/13/2022 19:51    Procedures Procedures    Medications Ordered in ED Medications  oxyCODONE-acetaminophen (PERCOCET/ROXICET) 5-325 MG per tablet 2 tablet (has no administration in time range)    ED Course/ Medical Decision Making/ A&P                           Medical Decision Making Amount and/or Complexity of Data Reviewed Radiology: ordered.  Risk Prescription drug management.   Patient 61 year old male presents today for evaluation of left knee pain.  He has history of left knee replacement.  Patient states he was walking when he twisted and felt his left knee buckle.  Has worsening swelling and difficulty bearing weight.  X-ray without acute findings.  Exam overall reassuring.  Without suspicion for septic arthritis given without erythema, warmth, or fever.  Will provide pain medication given he is in significant pain and tachycardic and then reevaluate. Pain improved following pain medication.  Tachycardia improved to about 105.  Patient is able  ambulate however with an antalgic gait.  He is able to bear weight on the left knee.  Again low suspicion for septic arthritis.  We will provide patient with knee immobilizer, crutches, and follow-up with sports medicine.  He is established with Dr. Amalia Hailey however he states Dr. Amalia Hailey has left the practice and he sees the PA and is considering changing practices.  Patient is appropriate for discharge.  Narcotic pain medication provided for severe or breakthrough pain.  Patient has history of solitary kidney so we will avoid use of NSAIDs.  He does have Voltaren gel that he uses as needed.  Discussed use of Tylenol.  Patient is appropriate for discharge.  Discharged in stable condition.  Return precautions discussed.   Final Clinical Impression(s) / ED Diagnoses Final diagnoses:  Acute pain of left knee    Rx / DC Orders ED Discharge Orders     None         Evlyn Courier, PA-C 06/13/22 2118    Lennice Sites, DO 06/13/22 2120

## 2022-06-17 ENCOUNTER — Ambulatory Visit: Payer: Self-pay

## 2022-06-17 ENCOUNTER — Ambulatory Visit (INDEPENDENT_AMBULATORY_CARE_PROVIDER_SITE_OTHER): Payer: Medicare (Managed Care) | Admitting: Internal Medicine

## 2022-06-17 ENCOUNTER — Encounter: Payer: Self-pay | Admitting: Internal Medicine

## 2022-06-17 VITALS — BP 128/90 | HR 98 | Temp 98.1°F | Resp 18

## 2022-06-17 DIAGNOSIS — J309 Allergic rhinitis, unspecified: Secondary | ICD-10-CM | POA: Diagnosis not present

## 2022-06-17 DIAGNOSIS — H1013 Acute atopic conjunctivitis, bilateral: Secondary | ICD-10-CM

## 2022-06-17 DIAGNOSIS — J3089 Other allergic rhinitis: Secondary | ICD-10-CM

## 2022-06-17 DIAGNOSIS — J4540 Moderate persistent asthma, uncomplicated: Secondary | ICD-10-CM | POA: Diagnosis not present

## 2022-06-17 DIAGNOSIS — H1010 Acute atopic conjunctivitis, unspecified eye: Secondary | ICD-10-CM

## 2022-06-17 NOTE — Patient Instructions (Signed)
Moderate Persistent  Asthma: well controlled    PLAN:  - We will get spirometry at next visit  - Daily controller medication(s): Singulair '10mg'$  daily and Trelegy 100/62.5/25 one puff once daily - Prior to physical activity: albuterol 2 puffs 10-15 minutes before physical activity. - Rescue medications: albuterol 4 puffs every 4-6 hours as needed - Get Influenza Vaccine and appropriate Pneumonia and COVID 19 boosters  - Asthma control goals:  * Full participation in all desired activities (may need albuterol before activity) * Albuterol use two time or less a week on average (not counting use with activity) * Cough interfering with sleep two time or less a month * Oral steroids no more than once a year * No hospitalizations   Allergic  Rhinitis: improved -  Continue Avoidance measures  - Continue with: Allegra (fexofenadine) '180mg'$  table once daily, Flonase (fluticasone) two sprays per nostril daily, and Pataday (olopatadine) one drop per eye twice daily as needed -  Continue Allergy Injections, goal is 3-5 years of maintenance dosing    Allergic Conjunctivitis - Continue Allergen avoidance as instructed - Avoiding rubbing eyes, if irritated use a wet wash cloth to wipe allergen out of eyes  - Continue  great options include Pataday (Olopatadine)  eye drops   -Avoid eye drops that say red eye relief as they may contain medications that dry out your eyes.  Take care of that knee!!  Follow up: 4 months   Thank you so much for letting me partake in your care today.  Don't hesitate to reach out if you have any additional concerns!  [name], MD  [facility] [place] [place]
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
## 2022-06-23 ENCOUNTER — Ambulatory Visit (INDEPENDENT_AMBULATORY_CARE_PROVIDER_SITE_OTHER): Payer: No Typology Code available for payment source

## 2022-06-23 DIAGNOSIS — J309 Allergic rhinitis, unspecified: Secondary | ICD-10-CM

## 2022-07-14 ENCOUNTER — Ambulatory Visit (INDEPENDENT_AMBULATORY_CARE_PROVIDER_SITE_OTHER): Payer: No Typology Code available for payment source

## 2022-07-14 DIAGNOSIS — J309 Allergic rhinitis, unspecified: Secondary | ICD-10-CM

## 2022-07-24 ENCOUNTER — Ambulatory Visit (INDEPENDENT_AMBULATORY_CARE_PROVIDER_SITE_OTHER): Payer: No Typology Code available for payment source | Admitting: *Deleted

## 2022-07-24 DIAGNOSIS — J309 Allergic rhinitis, unspecified: Secondary | ICD-10-CM

## 2022-07-31 ENCOUNTER — Ambulatory Visit (INDEPENDENT_AMBULATORY_CARE_PROVIDER_SITE_OTHER): Payer: No Typology Code available for payment source | Admitting: *Deleted

## 2022-07-31 DIAGNOSIS — J309 Allergic rhinitis, unspecified: Secondary | ICD-10-CM

## 2022-08-07 ENCOUNTER — Ambulatory Visit (INDEPENDENT_AMBULATORY_CARE_PROVIDER_SITE_OTHER): Payer: No Typology Code available for payment source | Admitting: *Deleted

## 2022-08-07 DIAGNOSIS — J309 Allergic rhinitis, unspecified: Secondary | ICD-10-CM | POA: Diagnosis not present

## 2022-08-27 ENCOUNTER — Ambulatory Visit (INDEPENDENT_AMBULATORY_CARE_PROVIDER_SITE_OTHER): Payer: No Typology Code available for payment source

## 2022-08-27 DIAGNOSIS — J309 Allergic rhinitis, unspecified: Secondary | ICD-10-CM | POA: Diagnosis not present

## 2022-09-15 ENCOUNTER — Ambulatory Visit (INDEPENDENT_AMBULATORY_CARE_PROVIDER_SITE_OTHER): Payer: No Typology Code available for payment source

## 2022-09-15 DIAGNOSIS — J309 Allergic rhinitis, unspecified: Secondary | ICD-10-CM | POA: Diagnosis not present

## 2022-09-25 ENCOUNTER — Ambulatory Visit (INDEPENDENT_AMBULATORY_CARE_PROVIDER_SITE_OTHER): Payer: No Typology Code available for payment source

## 2022-09-25 DIAGNOSIS — J309 Allergic rhinitis, unspecified: Secondary | ICD-10-CM

## 2022-09-28 ENCOUNTER — Encounter: Payer: Self-pay | Admitting: Internal Medicine

## 2022-09-28 ENCOUNTER — Ambulatory Visit (INDEPENDENT_AMBULATORY_CARE_PROVIDER_SITE_OTHER): Payer: No Typology Code available for payment source | Admitting: Internal Medicine

## 2022-09-28 VITALS — BP 124/84 | HR 84 | Temp 98.0°F | Resp 20 | Ht 64.0 in | Wt 211.7 lb

## 2022-09-28 DIAGNOSIS — B9689 Other specified bacterial agents as the cause of diseases classified elsewhere: Secondary | ICD-10-CM

## 2022-09-28 DIAGNOSIS — J4540 Moderate persistent asthma, uncomplicated: Secondary | ICD-10-CM | POA: Diagnosis not present

## 2022-09-28 DIAGNOSIS — H1045 Other chronic allergic conjunctivitis: Secondary | ICD-10-CM

## 2022-09-28 DIAGNOSIS — J019 Acute sinusitis, unspecified: Secondary | ICD-10-CM

## 2022-09-28 DIAGNOSIS — J3089 Other allergic rhinitis: Secondary | ICD-10-CM | POA: Diagnosis not present

## 2022-09-28 DIAGNOSIS — H1013 Acute atopic conjunctivitis, bilateral: Secondary | ICD-10-CM | POA: Diagnosis not present

## 2022-09-28 DIAGNOSIS — J302 Other seasonal allergic rhinitis: Secondary | ICD-10-CM

## 2022-09-28 MED ORDER — AMOXICILLIN-POT CLAVULANATE 875-125 MG PO TABS
1.0000 | ORAL_TABLET | Freq: Two times a day (BID) | ORAL | 0 refills | Status: AC
Start: 2022-09-28 — End: 2022-10-05

## 2022-09-28 MED ORDER — METHYLPREDNISOLONE ACETATE 80 MG/ML IJ SUSP
80.0000 mg | Freq: Once | INTRAMUSCULAR | Status: AC
  Administered 2022-09-28: 80 mg via INTRAMUSCULAR

## 2022-09-28 NOTE — Progress Notes (Signed)
Follow Up Note  RE: [name] MRN: [id number] DOB: October 19, 1961 Date of Office Visit: 09/28/2022  Referring provider: Shanon Ace,* Primary care provider: Shanon Ace, MD  Chief Complaint: Asthma  History of Present Illness: I had the pleasure of seeing [name] for a follow up visit at the [facility] [place] [facility] [place] on 09/28/2022. He is a 61 y.o. male, who is being followed for persistent asthma, allergic rhinitis allergic conjunctivitis . His previous allergy office visit was on 06/17/22 with Dr. Edison Pace. Today is a regular follow up visit.  History obtained from patient, chart review.  ASTHMA - Medical therapy: Trelegy 130mg one puff daily, singulair '10mg'$  daily  - Rescue inhaler use: exercise induced symptoms will trigger albuterol use about 2 times per month - Symptoms: increased cough, dyspneas due to increased outdoor exposure in garden, will have daily more mild symptoms  - Exacerbation history: 0 ABX for respiratory illness since last visit, 0 OCS, 0ED, 0 UC visits in the past year  - ACT: 17 /25 - Adverse effects of medication: denies  - Previous FEV1: 2.51L L, 91% - [facility] nAEC 400 03/11/21   Allergic  Rhinitis: current therapy: allegra, flonase, pataday eye drops ,  symptoms  worsened over the past 3 weeks  symptoms include:  sinus pressure/tenderness, ear fullness , nasal congestion, rhinorrhea, and post nasal drainage Previous allergy testing: yes History of reflux/heartburn: no Interested in Allergy Immunotherapy:  on AIT, restarted due to a break due to logisitics over the summer. Last injection 09/25/22: Red 0.088m Vial 1 ( Mold), Vial 2 (G-RW-T)   -Denies LLR or SR     Assessment and Plan: FeKenrys a [age].o. male with: Moderate persistent asthma without complication - Plan: Spirometry with Graph  Acute bacterial sinusitis - Plan: methylPREDNISolone acetate (DEPO-MEDROL) injection 80 mg  Seasonal and  perennial allergic rhinitis  Other chronic allergic conjunctivitis of both eyes Plan: Patient Instructions  Acute sinus infection  - Start Augmentin 875/'125mg'$  twice a day for 7 days  - Solumedrol '80mg'$  IM injection given today in clinic    Moderate Persistent  Asthma: well controlled   - Breathing test looked good today!   PLAN:   - Daily controller medication(s): Singulair '10mg'$  daily and Trelegy 100/62.5/25 one puff once daily - Prior to physical activity: albuterol 2 puffs 10-15 minutes before physical activity. - Rescue medications: albuterol 4 puffs every 4-6 hours as needed - Get Influenza Vaccine and appropriate Pneumonia and COVID 19 boosters  - Asthma control goals:  * Full participation in all desired activities (may need albuterol before activity) * Albuterol use two time or less a week on average (not counting use with activity) * Cough interfering with sleep two time or less a month * Oral steroids no more than once a year * No hospitalizations   Allergic  Rhinitis: Slight worse control due to sinus infection  -  Continue Avoidance measures  - Continue with: Allegra (fexofenadine) '180mg'$  table once daily, Flonase (fluticasone) two sprays per nostril daily, and Pataday (olopatadine) one drop per eye twice daily as needed -  Continue Allergy Injections, goal is 3-5 years of maintenance dosing    Allergic Conjunctivitis: well controlled  - Continue Allergen avoidance as instructed - Avoiding rubbing eyes, if irritated use a wet wash cloth to wipe allergen out of eyes  - Continue  great options include Pataday (Olopatadine)  eye drops   -Avoid eye drops that say red eye  relief as they may contain medications that dry out your eyes.  Follow up: 3 months   Thank you so much for letting me partake in your care today.  Don't hesitate to reach out if you have any additional concerns!  [name], MD  [facility] [place] [place] No follow-ups on  file.  Meds ordered this encounter  Medications   amoxicillin-clavulanate (AUGMENTIN) 875-125 MG tablet    Sig: Take 1 tablet by mouth 2 (two) times daily for 7 days.    Dispense:  14 tablet    Refill:  0   methylPREDNISolone acetate (DEPO-MEDROL) injection 80 mg    Lab Orders  No laboratory test(s) ordered today   Diagnostics: Spirometry:  Tracings reviewed. His effort: Good reproducible efforts. FVC: 3.31 L FEV1: 2.74 L, 100% predicted FEV1/FVC ratio: 83% Interpretation: Spirometry consistent with normal pattern.  Please see scanned spirometry results for details.   Results interpreted by myself during this encounter and discussed with patient/family.   Medication List:  Current Outpatient Medications  Medication Sig Dispense Refill   albuterol (VENTOLIN HFA) 108 (90 Base) MCG/ACT inhaler Inhale 2 puffs into the lungs every 6 (six) hours as needed for wheezing or shortness of breath. 8 g 2   ALBUTEROL IN Inhale 2 puffs into the lungs as needed.     amoxicillin-clavulanate (AUGMENTIN) 875-125 MG tablet Take 1 tablet by mouth 2 (two) times daily for 7 days. 14 tablet 0   atorvastatin (LIPITOR) 80 MG tablet Take 1 tablet by mouth daily.     cetirizine (ZYRTEC) 10 MG tablet Take 1 tablet by mouth at bedtime.     Cholecalciferol 50 MCG (2000 UT) TABS Take 2 tablets by mouth daily.     citalopram (CELEXA) 40 MG tablet Take 40 mg by mouth daily.     empagliflozin (JARDIANCE) 25 MG TABS tablet Take 25 mg by mouth daily.     EPINEPHrine 0.3 mg/0.3 mL IJ SOAJ injection Inject into the muscle.     fexofenadine (ALLEGRA) 180 MG tablet Take 1 tablet by mouth daily.     fluticasone (FLONASE) 50 MCG/ACT nasal spray Administer 1 spray in each nostril daily as needed.     Fluticasone-Salmeterol (ADVAIR DISKUS) 250-50 MCG/DOSE AEPB Inhale 1 puff into the lungs 2 (two) times daily.  3   gabapentin (NEURONTIN) 600 MG tablet Take by mouth.     Glucagon 0.5 MG/0.1ML SOAJ Inject into the skin.      ketotifen (ZADITOR) 0.025 % ophthalmic solution Place 1 drop into both eyes 2 (two)  times daily as needed.     Lactobacillus (ACIDOPHILUS) CAPS capsule Take 2 capsules by mouth daily.     levothyroxine (SYNTHROID) 75 MCG tablet Take 1 tablet (75 mcg total) by mouth daily. 30 tablet 3   Lidocaine HCl 4 % LIQD Apply 1 application topically daily as needed.     losartan (COZAAR) 100 MG tablet Take 1 tablet by mouth daily.     metFORMIN (GLUCOPHAGE-XR) 500 MG 24 hr tablet Take 1,000 mg by mouth 2 (two) times daily.      methocarbamol (ROBAXIN) 500 MG tablet Take by mouth.     montelukast (SINGULAIR) 10 MG tablet Take 1 tablet by mouth at bedtime.     NON FORMULARY Allergen immunotherapy     Omega-3 1000 MG CAPS Take by mouth.     ondansetron (ZOFRAN-ODT) 4 MG disintegrating tablet DISSOLVE ONE TABLET MOUTH THREE TIMES A DAY AS NEEDED AS DIRECTED BY PROVIDER  pantoprazole (PROTONIX) 40 MG tablet Take 40 mg by mouth daily.     pramipexole (MIRAPEX) 0.5 MG tablet Take 0.5 mg by mouth 3 (three) times daily.     Semaglutide,0.25 or 0.'5MG'$ /DOS, 2 MG/1.5ML SOPN Inject 0.5 mg into the skin once a week.     Simethicone LIQD      sodium chloride (OCEAN) 0.65 % nasal spray SPRAY 2 SPRAYS IN EACH NOSTRIL FOUR TIMES A DAY     tadalafil (CIALIS) 20 MG tablet Take by mouth.     tamsulosin (FLOMAX) 0.4 MG CAPS capsule TAKE 1 CAPSULE (0.4 MG TOTAL) BY MOUTH DAILY. 30 capsule 5   testosterone cypionate (DEPOTESTOSTERONE CYPIONATE) 200 MG/ML injection Inject 200 mg into the muscle every 14 (fourteen) days.     urea (CARMOL) 10 % cream Apply topically as needed.     levothyroxine (SYNTHROID) 50 MCG tablet Take 1 tablet by mouth daily. (Patient not taking: Reported on 09/28/2022)     losartan (COZAAR) 25 MG tablet Take 100 mg by mouth daily.  (Patient not taking: Reported on 09/28/2022)     oxyCODONE-acetaminophen (PERCOCET/ROXICET) 5-325 MG tablet Take 1 tablet by mouth every 6 (six) hours as needed for severe  pain. (Patient not taking: Reported on 09/28/2022) 15 tablet 0   No current facility-administered medications for this visit.   Allergies: Allergies  Allergen Reactions   Hydrocodone Itching   Niacin And Related Hives   I reviewed his past medical history, social history, family history, and environmental history and no significant changes have been reported from his previous visit.  ROS: All others negative except as noted per HPI.   Objective: BP 124/84 (BP Location: Left Arm, Patient Position: Sitting, Cuff Size: Normal)   Pulse 84   Temp 98 F (36.7 C) (Temporal)   Resp 20   Ht '5\' 4"'$  (1.626 m)   Wt 211 lb 11.2 oz (96 kg)   SpO2 97%   BMI 36.34 kg/m  Body mass index is 36.34 kg/m. General Appearance:  Alert, cooperative, no distress, appears stated age  Head:  Normocephalic, without obvious abnormality, atraumatic  Eyes:  Conjunctiva clear, EOM's intact  Nose: Nares normal,  erythematous nasal mucosa with yellow rhinnorhea, sinus tenderness on right frontal sinus, hypertrophic turbinates, no visible anterior polyps, and septum midline  Throat: Lips, tongue normal; teeth and gums normal, tonsils 3+, no tonsillar exudate, and + cobblestoning  Neck: Supple, symmetrical  Lungs:   clear to auscultation bilaterally, Respirations unlabored, no coughing  Heart:  regular rate and rhythm and no murmur, Appears well perfused  Extremities: No edema  Skin: Skin color, texture, turgor normal, no rashes or lesions on visualized portions of skin   Neurologic: No gross deficits   Previous notes and tests were reviewed. The plan was reviewed with the patient/family, and all questions/concerned were addressed.  It was my pleasure to see Zaquan today and participate in his care. Please feel free to contact me with any questions or concerns.  Sincerely,  [name], MD  Allergy & Immunology  [facility] [place] [facility] Office: [phone]

## 2022-09-28 NOTE — Patient Instructions (Addendum)
Acute sinus infection  - Start Augmentin 875/'125mg'$  twice a day for 7 days  - Solumedrol '80mg'$  IM injection given today in clinic    Moderate Persistent  Asthma: well controlled   - Breathing test looked good today!   PLAN:   - Daily controller medication(s): Singulair '10mg'$  daily and Trelegy 100/62.5/25 one puff once daily - Prior to physical activity: albuterol 2 puffs 10-15 minutes before physical activity. - Rescue medications: albuterol 4 puffs every 4-6 hours as needed - Get Influenza Vaccine and appropriate Pneumonia and COVID 19 boosters  - Asthma control goals:  * Full participation in all desired activities (may need albuterol before activity) * Albuterol use two time or less a week on average (not counting use with activity) * Cough interfering with sleep two time or less a month * Oral steroids no more than once a year * No hospitalizations   Allergic  Rhinitis: Slight worse control due to sinus infection  -  Continue Avoidance measures  - Continue with: Allegra (fexofenadine) '180mg'$  table once daily, Flonase (fluticasone) two sprays per nostril daily, and Pataday (olopatadine) one drop per eye twice daily as needed -  Continue Allergy Injections, goal is 3-5 years of maintenance dosing    Allergic Conjunctivitis: well controlled  - Continue Allergen avoidance as instructed - Avoiding rubbing eyes, if irritated use a wet wash cloth to wipe allergen out of eyes  - Continue  great options include Pataday (Olopatadine)  eye drops   -Avoid eye drops that say red eye relief as they may contain medications that dry out your eyes.  Follow up: 3 months   Thank you so much for letting me partake in your care today.  Don't hesitate to reach out if you have any additional concerns!  [name], MD  [facility] [place], [place]

## 2022-10-01 ENCOUNTER — Ambulatory Visit (INDEPENDENT_AMBULATORY_CARE_PROVIDER_SITE_OTHER): Payer: No Typology Code available for payment source

## 2022-10-01 DIAGNOSIS — J309 Allergic rhinitis, unspecified: Secondary | ICD-10-CM | POA: Diagnosis not present

## 2022-10-08 ENCOUNTER — Ambulatory Visit (INDEPENDENT_AMBULATORY_CARE_PROVIDER_SITE_OTHER): Payer: No Typology Code available for payment source

## 2022-10-08 DIAGNOSIS — J309 Allergic rhinitis, unspecified: Secondary | ICD-10-CM | POA: Diagnosis not present

## 2022-10-16 ENCOUNTER — Ambulatory Visit (INDEPENDENT_AMBULATORY_CARE_PROVIDER_SITE_OTHER): Payer: No Typology Code available for payment source

## 2022-10-16 DIAGNOSIS — J309 Allergic rhinitis, unspecified: Secondary | ICD-10-CM | POA: Diagnosis not present

## 2022-10-21 ENCOUNTER — Ambulatory Visit (INDEPENDENT_AMBULATORY_CARE_PROVIDER_SITE_OTHER): Payer: No Typology Code available for payment source

## 2022-10-21 DIAGNOSIS — J309 Allergic rhinitis, unspecified: Secondary | ICD-10-CM

## 2022-11-02 ENCOUNTER — Ambulatory Visit (INDEPENDENT_AMBULATORY_CARE_PROVIDER_SITE_OTHER): Payer: No Typology Code available for payment source

## 2022-11-02 DIAGNOSIS — J309 Allergic rhinitis, unspecified: Secondary | ICD-10-CM | POA: Diagnosis not present

## 2022-11-11 ENCOUNTER — Ambulatory Visit (INDEPENDENT_AMBULATORY_CARE_PROVIDER_SITE_OTHER): Payer: No Typology Code available for payment source

## 2022-11-11 DIAGNOSIS — J309 Allergic rhinitis, unspecified: Secondary | ICD-10-CM | POA: Diagnosis not present

## 2022-11-24 ENCOUNTER — Ambulatory Visit (INDEPENDENT_AMBULATORY_CARE_PROVIDER_SITE_OTHER): Payer: No Typology Code available for payment source

## 2022-11-24 DIAGNOSIS — J309 Allergic rhinitis, unspecified: Secondary | ICD-10-CM | POA: Diagnosis not present

## 2022-12-07 ENCOUNTER — Ambulatory Visit (INDEPENDENT_AMBULATORY_CARE_PROVIDER_SITE_OTHER): Payer: No Typology Code available for payment source

## 2022-12-07 DIAGNOSIS — J309 Allergic rhinitis, unspecified: Secondary | ICD-10-CM

## 2022-12-08 NOTE — Progress Notes (Signed)
VIALS EXP 12-09-23 

## 2022-12-09 DIAGNOSIS — J301 Allergic rhinitis due to pollen: Secondary | ICD-10-CM | POA: Diagnosis not present

## 2022-12-10 DIAGNOSIS — J302 Other seasonal allergic rhinitis: Secondary | ICD-10-CM

## 2022-12-25 ENCOUNTER — Ambulatory Visit (INDEPENDENT_AMBULATORY_CARE_PROVIDER_SITE_OTHER): Payer: No Typology Code available for payment source

## 2022-12-25 DIAGNOSIS — J309 Allergic rhinitis, unspecified: Secondary | ICD-10-CM | POA: Diagnosis not present

## 2022-12-30 ENCOUNTER — Ambulatory Visit (INDEPENDENT_AMBULATORY_CARE_PROVIDER_SITE_OTHER): Payer: No Typology Code available for payment source

## 2022-12-30 DIAGNOSIS — J309 Allergic rhinitis, unspecified: Secondary | ICD-10-CM | POA: Diagnosis not present

## 2023-01-06 ENCOUNTER — Ambulatory Visit (INDEPENDENT_AMBULATORY_CARE_PROVIDER_SITE_OTHER): Payer: No Typology Code available for payment source

## 2023-01-06 DIAGNOSIS — J309 Allergic rhinitis, unspecified: Secondary | ICD-10-CM | POA: Diagnosis not present

## 2023-01-13 ENCOUNTER — Ambulatory Visit (INDEPENDENT_AMBULATORY_CARE_PROVIDER_SITE_OTHER): Payer: No Typology Code available for payment source | Admitting: Internal Medicine

## 2023-01-13 ENCOUNTER — Encounter: Payer: Self-pay | Admitting: Internal Medicine

## 2023-01-13 VITALS — BP 130/90 | HR 96 | Temp 97.9°F | Resp 18

## 2023-01-13 DIAGNOSIS — H1010 Acute atopic conjunctivitis, unspecified eye: Secondary | ICD-10-CM

## 2023-01-13 DIAGNOSIS — H1013 Acute atopic conjunctivitis, bilateral: Secondary | ICD-10-CM | POA: Diagnosis not present

## 2023-01-13 DIAGNOSIS — J309 Allergic rhinitis, unspecified: Secondary | ICD-10-CM

## 2023-01-13 DIAGNOSIS — J4540 Moderate persistent asthma, uncomplicated: Secondary | ICD-10-CM | POA: Diagnosis not present

## 2023-01-13 DIAGNOSIS — J302 Other seasonal allergic rhinitis: Secondary | ICD-10-CM

## 2023-01-13 NOTE — Progress Notes (Signed)
Follow Up Note  RE: [name] MRN: [id number] DOB: 1961-03-11 Date of Office Visit: 01/13/2023  Referring provider: Shanon Ace,* Primary care provider: Shanon Ace, MD  Chief Complaint: Asthma  History of Present Illness: I had the pleasure of seeing [name] for a follow up visit at the [facility] [place] [facility] [place] on 01/14/2023. He is a 62 y.o. male, who is being followed for allergic rhinoconjunctivitis on AIT, moderate persistent asthma, recurrent sinus infections. His previous allergy office visit was on 09/28/22 with Dr. Edison Pace. Today is a regular follow up visit.  History obtained from patient, chart review.  At last visit he was treated for acute sinus infection with steroids and Augmentin.  Today he reports  Significant improvement in nasal congestion, rhinnorhea since last visit Needing flonase 2 times per week.  Using allegra 1 tab in the morning and one in the evening.  Pataday eye drops as needed (3 times per week) Denies any cough, wheezing, dyspnea  Albuterol use: less than 2 times per week  No OCS or ABX since last visits   He still struggles with knee pain and hasn't been able to exercise.  He has follow up with ortho soon.   Pertinent History/Diagnostics:  - Asthma: moderate persistent  - normal  spirometry (09/28/22): ratio 83, 3.74, 100%  - Allergic Rhinitis:   - SPT environmental panel (01/02/19): G-RW-T-M)  - Vial 1 (G-RW-T), Vial 2 (M), started in 03/2019       Assessment and Plan: [name] is a 62 y.o. male with: Seasonal and perennial allergic rhinitis  Moderate persistent asthma without complication - Plan: Spirometry with Graph  Allergic rhinitis, unspecified seasonality, unspecified trigger  Seasonal and perennial allergic rhinoconjunctivitis Plan: Patient Instructions  Moderate Persistent  Asthma: well controlled   - Breathing test looked good today!  - We will plan to step down asthma therapy at  next visit if you are still well controlled  PLAN:   - Daily controller medication(s): Singulair '10mg'$  daily and Trelegy 100/62.5/25 one puff once daily - Prior to physical activity: albuterol 2 puffs 10-15 minutes before physical activity. - Rescue medications: albuterol 4 puffs every 4-6 hours as needed - Get Influenza Vaccine and appropriate Pneumonia and COVID 19 boosters  - Asthma control goals:  * Full participation in all desired activities (may need albuterol before activity) * Albuterol use two time or less a week on average (not counting use with activity) * Cough interfering with sleep two time or less a month * Oral steroids no more than once a year * No hospitalizations   Allergic  Rhinitis: improved  -  Continue Avoidance measures  - Continue with: Allegra (fexofenadine) '180mg'$  table once daily, Zyrtec '10mg'$  at night, Flonase (fluticasone) two sprays per nostril daily as needed, and Pataday (olopatadine) one drop per eye twice daily as needed -  Continue Allergy Injections, goal is 3-5 years of maintenance dosing    Allergic Conjunctivitis: well controlled  - Continue Allergen avoidance as instructed - Avoiding rubbing eyes, if irritated use a wet wash cloth to wipe allergen out of eyes  - Continue  great options include Pataday (Olopatadine)  eye drops   -Avoid eye drops that say red eye relief as they may contain medications that dry out your eyes.  Follow up: 6 months   I hope your knee gets better!!  Thank you so much for letting me partake in your care today.  Don't hesitate to reach out if  you have any additional concerns!  [name], MD  [facility] [place] [place] No follow-ups on file.  No orders of the defined types were placed in this encounter.   Lab Orders  No laboratory test(s) ordered today   Diagnostics: Spirometry:  Tracings reviewed. His effort: Good reproducible efforts. FVC: 2.92 L FEV1: 2.53 L, 93% predicted FEV1/FVC  ratio: 87% Interpretation: Spirometry consistent with normal pattern.  Please see scanned spirometry results for details.   Results interpreted by myself during this encounter and discussed with patient/family.   Medication List:  Current Outpatient Medications  Medication Sig Dispense Refill   albuterol (VENTOLIN HFA) 108 (90 Base) MCG/ACT inhaler Inhale 2 puffs into the lungs every 6 (six) hours as needed for wheezing or shortness of breath. 8 g 2   atorvastatin (LIPITOR) 80 MG tablet Take 1 tablet by mouth daily.     cetirizine (ZYRTEC) 10 MG tablet Take 1 tablet by mouth at bedtime.     Cholecalciferol 50 MCG (2000 UT) TABS Take 2 tablets by mouth daily.     citalopram (CELEXA) 40 MG tablet Take 40 mg by mouth daily.     empagliflozin (JARDIANCE) 25 MG TABS tablet Take 25 mg by mouth daily.     EPINEPHrine 0.3 mg/0.3 mL IJ SOAJ injection Inject into the muscle.     fexofenadine (ALLEGRA) 180 MG tablet Take 1 tablet by mouth daily.     fluticasone (FLONASE) 50 MCG/ACT nasal spray Administer 1 spray in each nostril daily as needed.     Fluticasone-Salmeterol (ADVAIR DISKUS) 250-50 MCG/DOSE AEPB Inhale 1 puff into the lungs 2 (two) times daily.  3   gabapentin (NEURONTIN) 600 MG tablet Take by mouth.     Glucagon 0.5 MG/0.1ML SOAJ Inject into the skin.     ketotifen (ZADITOR) 0.025 % ophthalmic solution Place 1 drop into both eyes 2 (two)  times daily as needed.     Lactobacillus (ACIDOPHILUS) CAPS capsule Take 2 capsules by mouth daily.     levothyroxine (SYNTHROID) 75 MCG tablet Take 1 tablet (75 mcg total) by mouth daily. 30 tablet 3   Lidocaine HCl 4 % LIQD Apply 1 application topically daily as needed.     losartan (COZAAR) 100 MG tablet Take 1 tablet by mouth daily.     metFORMIN (GLUCOPHAGE-XR) 500 MG 24 hr tablet Take 1,000 mg by mouth 2 (two) times daily.      methocarbamol (ROBAXIN) 500 MG tablet Take by mouth.     montelukast (SINGULAIR) 10 MG tablet Take 1 tablet by mouth  at bedtime.     NON FORMULARY Allergen immunotherapy     Omega-3 1000 MG CAPS Take by mouth.     ondansetron (ZOFRAN-ODT) 4 MG disintegrating tablet DISSOLVE ONE TABLET MOUTH THREE TIMES A DAY AS NEEDED AS DIRECTED BY PROVIDER     pantoprazole (PROTONIX) 40 MG tablet Take 40 mg by mouth daily.     pramipexole (MIRAPEX) 0.5 MG tablet Take 0.5 mg by mouth 3 (three) times daily.     Semaglutide,0.25 or 0.'5MG'$ /DOS, 2 MG/1.5ML SOPN Inject 0.5 mg into the skin once a week.     Simethicone LIQD      sodium chloride (OCEAN) 0.65 % nasal spray SPRAY 2 SPRAYS IN EACH NOSTRIL FOUR TIMES A DAY     tadalafil (CIALIS) 20 MG tablet Take by mouth.     tamsulosin (FLOMAX) 0.4 MG CAPS capsule TAKE 1 CAPSULE (0.4 MG TOTAL) BY MOUTH DAILY. 30 capsule  5   testosterone cypionate (DEPOTESTOSTERONE CYPIONATE) 200 MG/ML injection Inject 200 mg into the muscle every 14 (fourteen) days.     urea (CARMOL) 10 % cream Apply topically as needed.     No current facility-administered medications for this visit.   Allergies: Allergies  Allergen Reactions   Hydrocodone Itching   Niacin And Related Hives   I reviewed his past medical history, social history, family history, and environmental history and no significant changes have been reported from his previous visit.  ROS: All others negative except as noted per HPI.   Objective: BP (!) 130/90   Pulse 96   Temp 97.9 F (36.6 C) (Temporal)   Resp 18   SpO2 96%  There is no height or weight on file to calculate BMI. General Appearance:  Alert, cooperative, no distress, appears stated age  Head:  Normocephalic, without obvious abnormality, atraumatic  Eyes:  Conjunctiva clear, EOM's intact  Nose: Nares normal,   Throat: Lips, tongue normal; teeth and gums normal,   Neck: Supple, symmetrical  Lungs:   clear to auscultation bilaterally, Respirations unlabored, no coughing  Heart:  regular rate and rhythm and no murmur, Appears well perfused  Extremities: No edema   Skin: Skin color, texture, turgor normal, no rashes or lesions on visualized portions of skin   Neurologic: No gross deficits   Previous notes and tests were reviewed. The plan was reviewed with the patient/family, and all questions/concerned were addressed.  It was my pleasure to see [name] today and participate in his care. Please feel free to contact me with any questions or concerns.  Sincerely,  [name], MD  Allergy & Immunology  [facility] Office: [phone]

## 2023-01-13 NOTE — Patient Instructions (Addendum)
Moderate Persistent  Asthma: well controlled   - Breathing test looked good today!  - We will plan to step down asthma therapy at next visit if you are still well controlled  PLAN:   - Daily controller medication(s): Singulair '10mg'$  daily and Trelegy 100/62.5/25 one puff once daily - Prior to physical activity: albuterol 2 puffs 10-15 minutes before physical activity. - Rescue medications: albuterol 4 puffs every 4-6 hours as needed - Get Influenza Vaccine and appropriate Pneumonia and COVID 19 boosters  - Asthma control goals:  * Full participation in all desired activities (may need albuterol before activity) * Albuterol use two time or less a week on average (not counting use with activity) * Cough interfering with sleep two time or less a month * Oral steroids no more than once a year * No hospitalizations   Allergic  Rhinitis: improved  -  Continue Avoidance measures  - Continue with: Allegra (fexofenadine) '180mg'$  table once daily, Zyrtec '10mg'$  at night, Flonase (fluticasone) two sprays per nostril daily as needed, and Pataday (olopatadine) one drop per eye twice daily as needed -  Continue Allergy Injections, goal is 3-5 years of maintenance dosing    Allergic Conjunctivitis: well controlled  - Continue Allergen avoidance as instructed - Avoiding rubbing eyes, if irritated use a wet wash cloth to wipe allergen out of eyes  - Continue  great options include Pataday (Olopatadine)  eye drops   -Avoid eye drops that say red eye relief as they may contain medications that dry out your eyes.  Follow up: 6 months   I hope your knee gets better!!  Thank you so much for letting me partake in your care today.  Don't hesitate to reach out if you have any additional concerns!  [name], MD  [facility] [place], [place]

## 2023-01-14 DIAGNOSIS — J302 Other seasonal allergic rhinitis: Secondary | ICD-10-CM | POA: Insufficient documentation

## 2023-01-25 ENCOUNTER — Ambulatory Visit (INDEPENDENT_AMBULATORY_CARE_PROVIDER_SITE_OTHER): Payer: No Typology Code available for payment source

## 2023-01-25 DIAGNOSIS — J309 Allergic rhinitis, unspecified: Secondary | ICD-10-CM

## 2023-01-29 ENCOUNTER — Telehealth: Payer: Self-pay | Admitting: Internal Medicine

## 2023-01-29 NOTE — Telephone Encounter (Signed)
Patient's current authorization ([id number]) exp 02-27-2023  Faxed renewal of authorization request to [facility], [phone] and emailed it to [email or web address].   Called & advised patient of expiration date & asked him to reach out to the [place] as well. Patient states he no longer has an allergist at the [place] so he is hoping that the authorization will be approved. Advised patient to express that he is on immunotherapy with our office. Patient verbalized understanding and stated he would do so.

## 2023-02-10 ENCOUNTER — Ambulatory Visit (INDEPENDENT_AMBULATORY_CARE_PROVIDER_SITE_OTHER): Payer: No Typology Code available for payment source

## 2023-02-10 DIAGNOSIS — J309 Allergic rhinitis, unspecified: Secondary | ICD-10-CM

## 2023-02-22 ENCOUNTER — Ambulatory Visit (INDEPENDENT_AMBULATORY_CARE_PROVIDER_SITE_OTHER): Payer: No Typology Code available for payment source

## 2023-02-22 DIAGNOSIS — J309 Allergic rhinitis, unspecified: Secondary | ICD-10-CM

## 2023-03-08 ENCOUNTER — Ambulatory Visit (INDEPENDENT_AMBULATORY_CARE_PROVIDER_SITE_OTHER): Payer: No Typology Code available for payment source

## 2023-03-08 DIAGNOSIS — J309 Allergic rhinitis, unspecified: Secondary | ICD-10-CM | POA: Diagnosis not present

## 2023-03-09 NOTE — Telephone Encounter (Signed)
Received new authorization [id number]) with dates of 03-08-2023 to 03-06-2024. Authorization has been updated in system and indexed into media - [place] 03-08-2023 TO 03-06-2024.

## 2023-03-18 ENCOUNTER — Ambulatory Visit (INDEPENDENT_AMBULATORY_CARE_PROVIDER_SITE_OTHER): Payer: No Typology Code available for payment source

## 2023-03-18 DIAGNOSIS — J309 Allergic rhinitis, unspecified: Secondary | ICD-10-CM | POA: Diagnosis not present

## 2023-03-29 ENCOUNTER — Ambulatory Visit (INDEPENDENT_AMBULATORY_CARE_PROVIDER_SITE_OTHER): Payer: No Typology Code available for payment source

## 2023-03-29 DIAGNOSIS — J309 Allergic rhinitis, unspecified: Secondary | ICD-10-CM | POA: Diagnosis not present

## 2023-03-29 NOTE — Progress Notes (Signed)
EXP 03/31/24

## 2023-03-30 DIAGNOSIS — J301 Allergic rhinitis due to pollen: Secondary | ICD-10-CM | POA: Diagnosis not present

## 2023-03-31 DIAGNOSIS — J302 Other seasonal allergic rhinitis: Secondary | ICD-10-CM | POA: Diagnosis not present

## 2023-04-08 ENCOUNTER — Ambulatory Visit (INDEPENDENT_AMBULATORY_CARE_PROVIDER_SITE_OTHER): Payer: No Typology Code available for payment source

## 2023-04-08 DIAGNOSIS — J309 Allergic rhinitis, unspecified: Secondary | ICD-10-CM | POA: Diagnosis not present

## 2023-04-14 ENCOUNTER — Ambulatory Visit (INDEPENDENT_AMBULATORY_CARE_PROVIDER_SITE_OTHER): Payer: No Typology Code available for payment source

## 2023-04-14 DIAGNOSIS — J309 Allergic rhinitis, unspecified: Secondary | ICD-10-CM

## 2023-04-21 ENCOUNTER — Ambulatory Visit (INDEPENDENT_AMBULATORY_CARE_PROVIDER_SITE_OTHER): Payer: No Typology Code available for payment source

## 2023-04-21 DIAGNOSIS — J309 Allergic rhinitis, unspecified: Secondary | ICD-10-CM | POA: Diagnosis not present

## 2023-04-29 ENCOUNTER — Ambulatory Visit (INDEPENDENT_AMBULATORY_CARE_PROVIDER_SITE_OTHER): Payer: No Typology Code available for payment source

## 2023-04-29 DIAGNOSIS — J309 Allergic rhinitis, unspecified: Secondary | ICD-10-CM | POA: Diagnosis not present

## 2023-05-04 ENCOUNTER — Ambulatory Visit (INDEPENDENT_AMBULATORY_CARE_PROVIDER_SITE_OTHER): Payer: No Typology Code available for payment source

## 2023-05-04 DIAGNOSIS — J309 Allergic rhinitis, unspecified: Secondary | ICD-10-CM | POA: Diagnosis not present

## 2023-05-18 ENCOUNTER — Ambulatory Visit (INDEPENDENT_AMBULATORY_CARE_PROVIDER_SITE_OTHER): Payer: No Typology Code available for payment source

## 2023-05-18 DIAGNOSIS — J309 Allergic rhinitis, unspecified: Secondary | ICD-10-CM | POA: Diagnosis not present

## 2023-06-01 ENCOUNTER — Ambulatory Visit (INDEPENDENT_AMBULATORY_CARE_PROVIDER_SITE_OTHER): Payer: No Typology Code available for payment source

## 2023-06-01 DIAGNOSIS — J309 Allergic rhinitis, unspecified: Secondary | ICD-10-CM | POA: Diagnosis not present

## 2023-06-15 ENCOUNTER — Ambulatory Visit (INDEPENDENT_AMBULATORY_CARE_PROVIDER_SITE_OTHER): Payer: No Typology Code available for payment source

## 2023-06-15 DIAGNOSIS — J309 Allergic rhinitis, unspecified: Secondary | ICD-10-CM

## 2023-06-28 ENCOUNTER — Ambulatory Visit (INDEPENDENT_AMBULATORY_CARE_PROVIDER_SITE_OTHER): Payer: No Typology Code available for payment source

## 2023-06-28 DIAGNOSIS — J309 Allergic rhinitis, unspecified: Secondary | ICD-10-CM

## 2023-07-07 ENCOUNTER — Ambulatory Visit (INDEPENDENT_AMBULATORY_CARE_PROVIDER_SITE_OTHER): Payer: No Typology Code available for payment source

## 2023-07-07 DIAGNOSIS — J309 Allergic rhinitis, unspecified: Secondary | ICD-10-CM

## 2023-07-09 DIAGNOSIS — J301 Allergic rhinitis due to pollen: Secondary | ICD-10-CM | POA: Diagnosis not present

## 2023-07-09 NOTE — Progress Notes (Signed)
EXP 07/08/24

## 2023-07-12 DIAGNOSIS — J302 Other seasonal allergic rhinitis: Secondary | ICD-10-CM | POA: Diagnosis not present

## 2023-07-14 ENCOUNTER — Encounter: Payer: Self-pay | Admitting: Internal Medicine

## 2023-07-14 ENCOUNTER — Ambulatory Visit (INDEPENDENT_AMBULATORY_CARE_PROVIDER_SITE_OTHER): Payer: No Typology Code available for payment source | Admitting: Internal Medicine

## 2023-07-14 VITALS — BP 132/86 | HR 89 | Temp 97.9°F | Resp 18 | Wt 209.5 lb

## 2023-07-14 DIAGNOSIS — J3089 Other allergic rhinitis: Secondary | ICD-10-CM

## 2023-07-14 DIAGNOSIS — R1319 Other dysphagia: Secondary | ICD-10-CM | POA: Diagnosis not present

## 2023-07-14 DIAGNOSIS — H1045 Other chronic allergic conjunctivitis: Secondary | ICD-10-CM | POA: Diagnosis not present

## 2023-07-14 DIAGNOSIS — J309 Allergic rhinitis, unspecified: Secondary | ICD-10-CM

## 2023-07-14 DIAGNOSIS — J302 Other seasonal allergic rhinitis: Secondary | ICD-10-CM

## 2023-07-14 DIAGNOSIS — J4540 Moderate persistent asthma, uncomplicated: Secondary | ICD-10-CM

## 2023-07-14 MED ORDER — IPRATROPIUM BROMIDE 0.06 % NA SOLN: 2.0000 | Freq: Four times a day (QID) | NASAL | 12 refills | Status: DC

## 2023-07-14 NOTE — Patient Instructions (Addendum)
Moderate Persistent  Asthma: well controlled   - Breathing test looked good today!  - Consider discussing barium swallow for aspiration with pulmonary physician  - Continue with BiPAP oxygen study as planned   PLAN:   - Daily controller medication(s): Singulair 10mg  daily and  Advair 500 mcg 1 puff twice daily  - Prior to physical activity: albuterol 2 puffs 10-15 minutes before physical activity. - Rescue medications: albuterol 4 puffs every 4-6 hours as needed - Get Influenza Vaccine and appropriate Pneumonia and COVID 19 boosters  - Asthma control goals:  * Full participation in all desired activities (may need albuterol before activity) * Albuterol use two time or less a week on average (not counting use with activity) * Cough interfering with sleep two time or less a month * Oral steroids no more than once a year * No hospitalizations   Allergic  Rhinitis: slight increase in drainage -  Continue Avoidance measures  - Continue with: Allegra (fexofenadine) 180mg  table once daily, Zyrtec 10mg  at night, Flonase (fluticasone) two sprays per nostril daily as needed, and Pataday (olopatadine) one drop per eye twice daily as needed -  Continue Allergy Injections, goal is 3-5 years of maintenance dosing  - Add on Atrovent (ipatopium) nasal spray 1-2 sprays in each nostril up to three times daily AS NEEDED for POST NASAL DRIP/RUNNY NOSE/DRAINAGE.  If you become too dry, use less often.    Allergic Conjunctivitis: well controlled  - Continue Allergen avoidance as instructed - Avoiding rubbing eyes, if irritated use a wet wash cloth to wipe allergen out of eyes  - Continue  great options include Pataday (Olopatadine)  eye drops   -Avoid eye drops that say red eye relief as they may contain medications that dry out your eyes.  Follow up: 6 months   Thank you so much for letting me partake in your care today.  Don't hesitate to reach out if you have any additional concerns!  Ferol Luz, MD  [facility] [place], [place]

## 2023-07-14 NOTE — Progress Notes (Signed)
Follow Up Note  RE: [name] MRN: [id number] DOB: 1961/12/26 Date of Office Visit: 07/14/2023  Referring provider: Melvenia Beam,* Primary care provider: Melvenia Beam, MD  Chief Complaint: No chief complaint on file.  History of Present Illness: I had the pleasure of seeing [name] for a follow up visit at the [facility] on 07/14/2023. He is a 62 y.o. male, who is being followed for allergic rhinoconjunctivitis on AIT, moderate persistent asthma, recurrent sinus infections. His previous allergy office visit was on 01/13/23  with Dr. Marlynn Perking. Today is a regular follow up visit.  History obtained from patient, chart review.  At last visit he was treated for acute sinus infection with steroids and Augmentin.  Today he reports Mild increase in nasal drainage since last visit. Using flonase one spray per nostril daily.  Using allegra 1 tab in the morning and one in the evening.  Pataday eye drops as needed (2 times per week) Recently had sIgE obtained at pulmonary visit at [facility] which raised concerned about efficacy of AIT because he was still positive to grass, weed, trees, mold which are all in AIT prescription.  DM was 0.23.   He feels like [name] has overall helped his rhinitis symptoms and denies any LR/SR.  His pulmonologist was concerned about his asthma and stepped up his Advair to 500 mcg.  He is also being evaluated for nocturnal oxygen with his BiPAP machine. Mild cough, wheeze, dyspnea when he uses wood chipper outside of home, will need albuterol during this time.  Albuterol use: one time per week  No OCS or ABX since last visits     Pertinent History/Diagnostics:  - Asthma: moderate persistent  - normal  spirometry (09/28/22): ratio 83, 3.74, 100%   - Total IgE 1669 (6/524); AEC 400 03/11/21 - Allergic Rhinitis:   - SPT environmental panel (01/02/19): G-RW-T-M)  - Vial 1 (G-RW-T), Vial 2 (M), started in 03/2019   Review of  systems positive for some choking while eating.  Feels like it is going "down the wrong pipe" versus difficulty swallowing.  He has not had a barium swallow.    Assessment and Plan: [name] is a 62 y.o. male with: Moderate persistent asthma without complication  Seasonal and perennial allergic rhinitis  Other chronic allergic conjunctivitis of both eyes  Allergic rhinitis, unspecified seasonality, unspecified trigger  Other dysphagia Plan: Patient Instructions  Moderate Persistent  Asthma: well controlled   - Breathing test looked good today!  - Consider discussing barium swallow for aspiration with pulmonary physician  - Continue with BiPAP oxygen study as planned   PLAN:   - Daily controller medication(s): Singulair 10mg  daily and  Advair 500 mcg 1 puff twice daily  - Prior to physical activity: albuterol 2 puffs 10-15 minutes before physical activity. - Rescue medications: albuterol 4 puffs every 4-6 hours as needed - Get Influenza Vaccine and appropriate Pneumonia and COVID 19 boosters  - Asthma control goals:  * Full participation in all desired activities (may need albuterol before activity) * Albuterol use two time or less a week on average (not counting use with activity) * Cough interfering with sleep two time or less a month * Oral steroids no more than once a year * No hospitalizations   Allergic  Rhinitis: slight increase in drainage -  Continue Avoidance measures  - Continue with: Allegra (fexofenadine) 180mg  table once daily, Zyrtec 10mg  at night, Flonase (fluticasone) two sprays per nostril daily  as needed, and Pataday (olopatadine) one drop per eye twice daily as needed -  Continue Allergy Injections, goal is 3-5 years of maintenance dosing  - Add on Atrovent (ipatopium) nasal spray 1-2 sprays in each nostril up to three times daily AS NEEDED for POST NASAL DRIP/RUNNY NOSE/DRAINAGE.  If you become too dry, use less often.    Allergic Conjunctivitis: well  controlled  - Continue Allergen avoidance as instructed - Avoiding rubbing eyes, if irritated use a wet wash cloth to wipe allergen out of eyes  - Continue  great options include Pataday (Olopatadine)  eye drops   -Avoid eye drops that say red eye relief as they may contain medications that dry out your eyes.  Follow up: 6 months   Thank you so much for letting me partake in your care today.  Don't hesitate to reach out if you have any additional concerns!  Ferol Luz, MD  [facility] [place], [place] No follow-ups on file.  Meds ordered this encounter  Medications   ipratropium (ATROVENT) 0.06 % nasal spray    Sig: Place 2 sprays into both nostrils 4 (four) times daily.    Dispense:  15 mL    Refill:  12    Lab Orders  No laboratory test(s) ordered today   Diagnostics: Spirometry:  Tracings reviewed. His effort: Good reproducible efforts. FVC: 3.15 L FEV1: 2.59 L, 95% predicted FEV1/FVC ratio: 85% Interpretation: Spirometry consistent with normal pattern.  Please see scanned spirometry results for details.   Results interpreted by myself during this encounter and discussed with patient/family.   Medication List:  Current Outpatient Medications  Medication Sig Dispense Refill   albuterol (VENTOLIN HFA) 108 (90 Base) MCG/ACT inhaler Inhale 2 puffs into the lungs every 6 (six) hours as needed for wheezing or shortness of breath. 8 g 2   atorvastatin (LIPITOR) 80 MG tablet Take 1 tablet by mouth daily.     cetirizine (ZYRTEC) 10 MG tablet Take 1 tablet by mouth at bedtime.     Cholecalciferol 50 MCG (2000 UT) TABS Take 2 tablets by mouth daily.     citalopram (CELEXA) 40 MG tablet Take 40 mg by mouth daily.     empagliflozin (JARDIANCE) 25 MG TABS tablet Take 25 mg by mouth daily.     EPINEPHrine 0.3 mg/0.3 mL IJ SOAJ injection Inject into the muscle.     fexofenadine (ALLEGRA) 180 MG tablet Take 1 tablet by mouth daily.     fluticasone (FLONASE) 50  MCG/ACT nasal spray Administer 1 spray in each nostril daily as needed.     Fluticasone-Salmeterol (ADVAIR DISKUS) 250-50 MCG/DOSE AEPB Inhale 1 puff into the lungs 2 (two) times daily.  3   gabapentin (NEURONTIN) 600 MG tablet Take by mouth.     Glucagon 0.5 MG/0.1ML SOAJ Inject into the skin.     ipratropium (ATROVENT) 0.06 % nasal spray Place 2 sprays into both nostrils 4 (four) times daily. 15 mL 12   ketotifen (ZADITOR) 0.025 % ophthalmic solution Place 1 drop into both eyes 2 (two)  times daily as needed.     Lactobacillus (ACIDOPHILUS) CAPS capsule Take 2 capsules by mouth daily.     levothyroxine (SYNTHROID) 75 MCG tablet Take 1 tablet (75 mcg total) by mouth daily. 30 tablet 3   Lidocaine HCl 4 % LIQD Apply 1 application topically daily as needed.     losartan (COZAAR) 100 MG tablet Take 1 tablet by mouth daily.     metFORMIN (  GLUCOPHAGE-XR) 500 MG 24 hr tablet Take 1,000 mg by mouth 2 (two) times daily.      methocarbamol (ROBAXIN) 500 MG tablet Take by mouth.     montelukast (SINGULAIR) 10 MG tablet Take 1 tablet by mouth at bedtime.     NON FORMULARY Allergen immunotherapy     Omega-3 1000 MG CAPS Take by mouth.     ondansetron (ZOFRAN-ODT) 4 MG disintegrating tablet DISSOLVE ONE TABLET MOUTH THREE TIMES A DAY AS NEEDED AS DIRECTED BY PROVIDER     pantoprazole (PROTONIX) 40 MG tablet Take 40 mg by mouth daily.     pramipexole (MIRAPEX) 0.5 MG tablet Take 0.5 mg by mouth 3 (three) times daily.     Semaglutide,0.25 or 0.5MG /DOS, 2 MG/1.5ML SOPN Inject 0.5 mg into the skin once a week.     tadalafil (CIALIS) 20 MG tablet Take by mouth.     tamsulosin (FLOMAX) 0.4 MG CAPS capsule TAKE 1 CAPSULE (0.4 MG TOTAL) BY MOUTH DAILY. 30 capsule 5   testosterone cypionate (DEPOTESTOSTERONE CYPIONATE) 200 MG/ML injection Inject 200 mg into the muscle every 14 (fourteen) days.     urea (CARMOL) 10 % cream Apply topically as needed.     Simethicone LIQD  (Patient not taking: Reported on 07/14/2023)      sodium chloride (OCEAN) 0.65 % nasal spray SPRAY 2 SPRAYS IN EACH NOSTRIL FOUR TIMES A DAY (Patient not taking: Reported on 07/14/2023)     No current facility-administered medications for this visit.   Allergies: Allergies  Allergen Reactions   Hydrocodone Itching   Niacin And Related Hives   I reviewed his past medical history, social history, family history, and environmental history and no significant changes have been reported from his previous visit.  ROS: All others negative except as noted per HPI.   Objective: BP 132/86   Pulse 89   Temp 97.9 F (36.6 C) (Temporal)   Resp 18   Wt 209 lb 8 oz (95 kg)   SpO2 97%   BMI 35.96 kg/m  Body mass index is 35.96 kg/m. General Appearance:  Alert, cooperative, no distress, appears stated age  Head:  Normocephalic, without obvious abnormality, atraumatic  Eyes:  Conjunctiva clear, EOM's intact  Nose: Nares normal, erythematous nasal mucosa, no rhinnorhea   Throat: Lips, tongue normal; teeth and gums normal,   Neck: Supple, symmetrical  Lungs:   clear to auscultation bilaterally, Respirations unlabored, no coughing  Heart:  regular rate and rhythm and no murmur, Appears well perfused  Extremities: No edema  Skin: Skin color, texture, turgor normal, no rashes or lesions on visualized portions of skin   Neurologic: No gross deficits   Previous notes and tests were reviewed. The plan was reviewed with the patient/family, and all questions/concerned were addressed.  It was my pleasure to see [name] today and participate in his care. Please feel free to contact me with any questions or concerns.  Sincerely,  Ferol Luz, MD  Allergy & Immunology  [facility] Office: [phone]

## 2023-07-21 ENCOUNTER — Ambulatory Visit (INDEPENDENT_AMBULATORY_CARE_PROVIDER_SITE_OTHER): Payer: No Typology Code available for payment source

## 2023-07-21 DIAGNOSIS — J309 Allergic rhinitis, unspecified: Secondary | ICD-10-CM

## 2023-07-27 ENCOUNTER — Ambulatory Visit (INDEPENDENT_AMBULATORY_CARE_PROVIDER_SITE_OTHER): Payer: No Typology Code available for payment source

## 2023-07-27 DIAGNOSIS — J309 Allergic rhinitis, unspecified: Secondary | ICD-10-CM | POA: Diagnosis not present

## 2023-08-04 ENCOUNTER — Ambulatory Visit (INDEPENDENT_AMBULATORY_CARE_PROVIDER_SITE_OTHER): Payer: No Typology Code available for payment source

## 2023-08-04 DIAGNOSIS — J309 Allergic rhinitis, unspecified: Secondary | ICD-10-CM | POA: Diagnosis not present

## 2023-08-16 ENCOUNTER — Ambulatory Visit (INDEPENDENT_AMBULATORY_CARE_PROVIDER_SITE_OTHER): Payer: No Typology Code available for payment source

## 2023-08-16 DIAGNOSIS — J309 Allergic rhinitis, unspecified: Secondary | ICD-10-CM

## 2023-08-24 ENCOUNTER — Ambulatory Visit (INDEPENDENT_AMBULATORY_CARE_PROVIDER_SITE_OTHER): Payer: No Typology Code available for payment source

## 2023-08-24 DIAGNOSIS — J309 Allergic rhinitis, unspecified: Secondary | ICD-10-CM

## 2023-09-03 ENCOUNTER — Ambulatory Visit (INDEPENDENT_AMBULATORY_CARE_PROVIDER_SITE_OTHER): Payer: No Typology Code available for payment source

## 2023-09-03 DIAGNOSIS — S82002D Unspecified fracture of left patella, subsequent encounter for closed fracture with routine healing: Secondary | ICD-10-CM | POA: Diagnosis not present

## 2023-09-03 DIAGNOSIS — J309 Allergic rhinitis, unspecified: Secondary | ICD-10-CM | POA: Diagnosis not present

## 2023-09-03 DIAGNOSIS — E1142 Type 2 diabetes mellitus with diabetic polyneuropathy: Secondary | ICD-10-CM | POA: Diagnosis not present

## 2023-09-03 DIAGNOSIS — N1831 Chronic kidney disease, stage 3a: Secondary | ICD-10-CM | POA: Diagnosis not present

## 2023-09-03 DIAGNOSIS — Z794 Long term (current) use of insulin: Secondary | ICD-10-CM | POA: Diagnosis not present

## 2023-09-03 DIAGNOSIS — E1122 Type 2 diabetes mellitus with diabetic chronic kidney disease: Secondary | ICD-10-CM | POA: Diagnosis not present

## 2023-09-08 DIAGNOSIS — J301 Allergic rhinitis due to pollen: Secondary | ICD-10-CM | POA: Diagnosis not present

## 2023-09-08 NOTE — Progress Notes (Signed)
VIALS EXP 09-07-24

## 2023-09-09 ENCOUNTER — Ambulatory Visit (INDEPENDENT_AMBULATORY_CARE_PROVIDER_SITE_OTHER): Payer: No Typology Code available for payment source

## 2023-09-09 DIAGNOSIS — J309 Allergic rhinitis, unspecified: Secondary | ICD-10-CM

## 2023-09-10 ENCOUNTER — Ambulatory Visit: Payer: Medicare PPO | Admitting: Psychology

## 2023-09-10 DIAGNOSIS — J302 Other seasonal allergic rhinitis: Secondary | ICD-10-CM | POA: Diagnosis not present

## 2023-09-14 ENCOUNTER — Ambulatory Visit (INDEPENDENT_AMBULATORY_CARE_PROVIDER_SITE_OTHER): Payer: No Typology Code available for payment source

## 2023-09-14 DIAGNOSIS — J309 Allergic rhinitis, unspecified: Secondary | ICD-10-CM

## 2023-09-24 ENCOUNTER — Ambulatory Visit (INDEPENDENT_AMBULATORY_CARE_PROVIDER_SITE_OTHER): Payer: No Typology Code available for payment source | Admitting: *Deleted

## 2023-09-24 DIAGNOSIS — J309 Allergic rhinitis, unspecified: Secondary | ICD-10-CM | POA: Diagnosis not present

## 2023-09-29 ENCOUNTER — Ambulatory Visit (INDEPENDENT_AMBULATORY_CARE_PROVIDER_SITE_OTHER): Payer: No Typology Code available for payment source

## 2023-09-29 DIAGNOSIS — J309 Allergic rhinitis, unspecified: Secondary | ICD-10-CM | POA: Diagnosis not present

## 2023-10-08 ENCOUNTER — Ambulatory Visit (INDEPENDENT_AMBULATORY_CARE_PROVIDER_SITE_OTHER): Payer: No Typology Code available for payment source

## 2023-10-08 DIAGNOSIS — J309 Allergic rhinitis, unspecified: Secondary | ICD-10-CM

## 2023-10-12 ENCOUNTER — Ambulatory Visit (INDEPENDENT_AMBULATORY_CARE_PROVIDER_SITE_OTHER): Payer: No Typology Code available for payment source

## 2023-10-12 DIAGNOSIS — J309 Allergic rhinitis, unspecified: Secondary | ICD-10-CM

## 2023-10-20 ENCOUNTER — Ambulatory Visit (INDEPENDENT_AMBULATORY_CARE_PROVIDER_SITE_OTHER): Payer: No Typology Code available for payment source

## 2023-10-20 DIAGNOSIS — J309 Allergic rhinitis, unspecified: Secondary | ICD-10-CM

## 2023-11-04 ENCOUNTER — Ambulatory Visit (INDEPENDENT_AMBULATORY_CARE_PROVIDER_SITE_OTHER): Payer: No Typology Code available for payment source

## 2023-11-04 DIAGNOSIS — J309 Allergic rhinitis, unspecified: Secondary | ICD-10-CM | POA: Diagnosis not present

## 2023-11-17 ENCOUNTER — Ambulatory Visit (INDEPENDENT_AMBULATORY_CARE_PROVIDER_SITE_OTHER): Payer: No Typology Code available for payment source

## 2023-11-17 DIAGNOSIS — J309 Allergic rhinitis, unspecified: Secondary | ICD-10-CM

## 2023-12-02 ENCOUNTER — Ambulatory Visit (INDEPENDENT_AMBULATORY_CARE_PROVIDER_SITE_OTHER): Payer: No Typology Code available for payment source

## 2023-12-02 DIAGNOSIS — J309 Allergic rhinitis, unspecified: Secondary | ICD-10-CM | POA: Diagnosis not present

## 2023-12-10 DIAGNOSIS — J029 Acute pharyngitis, unspecified: Secondary | ICD-10-CM | POA: Diagnosis not present

## 2023-12-17 DIAGNOSIS — J301 Allergic rhinitis due to pollen: Secondary | ICD-10-CM | POA: Diagnosis not present

## 2023-12-17 NOTE — Progress Notes (Signed)
EXP 12/16/24

## 2023-12-20 DIAGNOSIS — J302 Other seasonal allergic rhinitis: Secondary | ICD-10-CM | POA: Diagnosis not present

## 2024-01-03 ENCOUNTER — Ambulatory Visit (INDEPENDENT_AMBULATORY_CARE_PROVIDER_SITE_OTHER): Payer: No Typology Code available for payment source | Admitting: *Deleted

## 2024-01-03 DIAGNOSIS — J309 Allergic rhinitis, unspecified: Secondary | ICD-10-CM

## 2024-01-23 NOTE — Progress Notes (Unsigned)
Subjective:     Patient ID: [name], male    DOB: [birth year], 62 y.o.   MRN: [id number]  No chief complaint on file.   HPI  Discussed the use of AI scribe software for clinical note transcription with the patient, who gave verbal consent to proceed.  History of Present Illness              Health Maintenance Due  Topic Date Due   Medicare Annual Wellness (AWV)  Never done   Diabetic kidney evaluation - Urine ACR  12/09/2016   Pneumococcal Vaccine 37-79 Years old (2 of 2 - PCV) 06/08/2018   DTaP/Tdap/Td (2 - Tdap) 12/28/2018   OPHTHALMOLOGY EXAM  05/19/2019   HEMOGLOBIN A1C  08/15/2019   FOOT EXAM  01/28/2022   Diabetic kidney evaluation - eGFR measurement  03/11/2022   COVID-19 Vaccine (6 - 2024-25 season) 08/29/2023    Past Medical History:  Diagnosis Date   Arthritis    Asthma    Cancer of kidney (HCC)    s/p L nephrectomy (renal cell carcinoma)   Carpal tunnel syndrome    Compression fracture of lumbar vertebra (HCC)    Depression    Deviated septum    Diabetes (HCC)    Fatty liver 03/08/2017   Fibromyalgia    Hepatitis A 1980   History of kidney cancer    Removed left kidney    Hypercholesteremia    Hypertension    Kidney disease    Migraine    Morbid obesity (HCC) 01/06/2017   Obesity    S/P revision of total knee, 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   Sinus congestion    Sleep apnea    Thyroid disease     Past Surgical History:  Procedure Laterality Date   ANKLE SURGERY     BACK SURGERY  2007   COLONOSCOPY WITH ESOPHAGOGASTRODUODENOSCOPY (EGD)  [facility]   HAND SURGERY Right    KNEE SURGERY Left    X2   NASAL SEPTUM SURGERY  02/22/2019   turbinate reduction   NEPHRECTOMY Left 2003   ROTATOR CUFF REPAIR Right     Family History  Problem Relation Age of Onset   Glaucoma Maternal Grandmother    Leukemia Maternal Grandfather    Heart attack Brother    Diabetes Brother    Allergies Brother    Diabetes  Brother    Allergies Sister    Asthma Sister    Heart disease Mother    Rheumatologic disease Mother    Lupus Mother    Angioedema Mother    Irritable bowel syndrome Mother    Diverticulosis Mother    Other Mother 54       Colon Resecction    Heart disease Father    Diabetes Father    Liver disease Father    Rheumatologic disease Sister    Cancer Paternal Grandfather        leukemia   Asthma Child    Allergic rhinitis Child    Esophageal cancer Maternal Aunt    Eczema Neg Hx    Urticaria Neg Hx     Social History   Socioeconomic History   Marital status: Married    Spouse name: Not on file   Number of children: 1   Years of education: Not on file   Highest education level: Not on file  Occupational History   Occupation: Retired  Retail banker   Tobacco Use  Smoking status: Some Days    Current packs/day: 0.00    Average packs/day: 1 pack/day for 10.0 years (10.0 ttl pk-yrs)    Types: Cigarettes, Cigars    Start date: 50    Last attempt to quit: 1986    Years since quitting: 39.0    Passive exposure: Current   Smokeless tobacco: Never  Vaping Use   Vaping status: Never Used  Substance and Sexual Activity   Alcohol use: Yes    Alcohol/week: 0.0 standard drinks of alcohol    Comment: occasionally   Drug use: Never   Sexual activity: Yes  Other Topics Concern   Not on file  Social History Narrative   1 son born 2004 Samuel Bouche   Married   On Disability from [place]- (back pain, knee pain, ankle pain, neuropathy)   Completed associates degree   Former Retail banker, 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18   Enjoys reading, Media planner, volunteering   Social Drivers of Corporate investment banker Strain: Not on file  Food Insecurity: Not on file  Transportation Needs: Not on file  Physical Activity: Not on file  Stress: Not on file  Social Connections: Unknown (05/11/2022)   Received from [employer], [facility]   Social Network    Social  Network: Not on file  Intimate Partner Violence: Unknown (04/02/2022)   [facility] from [facility], [facility]   HITS    Physically Hurt: Not on file    Insult or Talk Down To: Not on file    Threaten Physical Harm: Not on file    Scream or Curse: Not on file    Outpatient Medications Prior to Visit  Medication Sig Dispense Refill   albuterol (VENTOLIN HFA) 108 (90 Base) MCG/ACT inhaler Inhale 2 puffs into the lungs every 6 (six) hours as needed for wheezing or shortness of breath. 8 g 2   atorvastatin (LIPITOR) 80 MG tablet Take 1 tablet by mouth daily.     cetirizine (ZYRTEC) 10 MG tablet Take 1 tablet by mouth at bedtime.     Cholecalciferol 50 MCG (2000 UT) TABS Take 2 tablets by mouth daily.     citalopram (CELEXA) 40 MG tablet Take 40 mg by mouth daily.     empagliflozin (JARDIANCE) 25 MG TABS tablet Take 25 mg by mouth daily.     EPINEPHrine 0.3 mg/0.3 mL IJ SOAJ injection Inject into the muscle.     fexofenadine (ALLEGRA) 180 MG tablet Take 1 tablet by mouth daily.     fluticasone (FLONASE) 50 MCG/ACT nasal spray Administer 1 spray in each nostril daily as needed.     Fluticasone-Salmeterol (ADVAIR DISKUS) 250-50 MCG/DOSE AEPB Inhale 1 puff into the lungs 2 (two) times daily.  3   gabapentin (NEURONTIN) 600 MG tablet Take by mouth.     Glucagon 0.5 MG/0.1ML SOAJ Inject into the skin.     ipratropium (ATROVENT) 0.06 % nasal spray Place 2 sprays into both nostrils 4 (four) times daily. 15 mL 12   ketotifen (ZADITOR) 0.025 % ophthalmic solution Place 1 drop into both eyes 2 (two)  times daily as needed.     Lactobacillus (ACIDOPHILUS) CAPS capsule Take 2 capsules by mouth daily.     levothyroxine (SYNTHROID) 75 MCG tablet Take 1 tablet (75 mcg total) by mouth daily. 30 tablet 3   Lidocaine HCl 4 % LIQD Apply 1 application topically daily as needed.     losartan (COZAAR) 100 MG tablet Take  1 tablet by mouth daily.     metFORMIN (GLUCOPHAGE-XR) 500 MG 24 hr tablet Take 1,000 mg by  mouth 2 (two) times daily.      methocarbamol (ROBAXIN) 500 MG tablet Take by mouth.     montelukast (SINGULAIR) 10 MG tablet Take 1 tablet by mouth at bedtime.     NON FORMULARY Allergen immunotherapy     Omega-3 1000 MG CAPS Take by mouth.     ondansetron (ZOFRAN-ODT) 4 MG disintegrating tablet DISSOLVE ONE TABLET MOUTH THREE TIMES A DAY AS NEEDED AS DIRECTED BY PROVIDER     pantoprazole (PROTONIX) 40 MG tablet Take 40 mg by mouth daily.     pramipexole (MIRAPEX) 0.5 MG tablet Take 0.5 mg by mouth 3 (three) times daily.     Semaglutide,0.25 or 0.5MG /DOS, 2 MG/1.5ML SOPN Inject 0.5 mg into the skin once a week.     Simethicone LIQD  (Patient not taking: Reported on 07/14/2023)     sodium chloride (OCEAN) 0.65 % nasal spray SPRAY 2 SPRAYS IN EACH NOSTRIL FOUR TIMES A DAY (Patient not taking: Reported on 07/14/2023)     tadalafil (CIALIS) 20 MG tablet Take by mouth.     tamsulosin (FLOMAX) 0.4 MG CAPS capsule TAKE 1 CAPSULE (0.4 MG TOTAL) BY MOUTH DAILY. 30 capsule 5   testosterone cypionate (DEPOTESTOSTERONE CYPIONATE) 200 MG/ML injection Inject 200 mg into the muscle every 14 (fourteen) days.     urea (CARMOL) 10 % cream Apply topically as needed.     No facility-administered medications prior to visit.    Allergies  Allergen Reactions   Hydrocodone Itching   Niacin And Related Hives    ROS     Objective:    Physical Exam   There were no vitals taken for this visit. Wt Readings from Last 3 Encounters:  07/14/23 209 lb 8 oz (95 kg)  09/28/22 211 lb 11.2 oz (96 kg)  06/13/22 210 lb (95.3 kg)       Assessment & Plan:   Problem List Items Addressed This Visit   None   I am having [name]. Wesley "Nando" maintain his citalopram, metFORMIN, pantoprazole, Lidocaine HCl, urea, empagliflozin, Semaglutide(0.25 or 0.5MG /DOS), testosterone cypionate, tamsulosin, levothyroxine, NON FORMULARY, pramipexole, Fluticasone-Salmeterol, albuterol, atorvastatin, cetirizine,  Cholecalciferol, EPINEPHrine, fluticasone, gabapentin, Glucagon, ketotifen, Acidophilus, losartan, fexofenadine, methocarbamol, montelukast, Omega-3, ondansetron, sodium chloride, tadalafil, Simethicone, and ipratropium.  No orders of the defined types were placed in this encounter.

## 2024-01-24 ENCOUNTER — Ambulatory Visit (INDEPENDENT_AMBULATORY_CARE_PROVIDER_SITE_OTHER): Payer: Medicare PPO | Admitting: Family

## 2024-01-24 VITALS — BP 124/83 | HR 82 | Temp 97.8°F | Resp 16 | Ht 65.0 in | Wt 209.0 lb

## 2024-01-24 DIAGNOSIS — N189 Chronic kidney disease, unspecified: Secondary | ICD-10-CM

## 2024-01-24 DIAGNOSIS — I10 Essential (primary) hypertension: Secondary | ICD-10-CM | POA: Diagnosis not present

## 2024-01-24 DIAGNOSIS — Z905 Acquired absence of kidney: Secondary | ICD-10-CM

## 2024-01-24 DIAGNOSIS — G4733 Obstructive sleep apnea (adult) (pediatric): Secondary | ICD-10-CM

## 2024-01-24 DIAGNOSIS — Z96652 Presence of left artificial knee joint: Secondary | ICD-10-CM

## 2024-01-24 DIAGNOSIS — E7800 Pure hypercholesterolemia, unspecified: Secondary | ICD-10-CM

## 2024-01-24 DIAGNOSIS — E039 Hypothyroidism, unspecified: Secondary | ICD-10-CM | POA: Diagnosis not present

## 2024-01-24 DIAGNOSIS — E119 Type 2 diabetes mellitus without complications: Secondary | ICD-10-CM

## 2024-01-24 DIAGNOSIS — N400 Enlarged prostate without lower urinary tract symptoms: Secondary | ICD-10-CM

## 2024-01-24 DIAGNOSIS — Z7984 Long term (current) use of oral hypoglycemic drugs: Secondary | ICD-10-CM

## 2024-01-24 DIAGNOSIS — N289 Disorder of kidney and ureter, unspecified: Secondary | ICD-10-CM

## 2024-01-24 DIAGNOSIS — F32A Depression, unspecified: Secondary | ICD-10-CM

## 2024-01-24 DIAGNOSIS — K3184 Gastroparesis: Secondary | ICD-10-CM | POA: Insufficient documentation

## 2024-01-24 DIAGNOSIS — E291 Testicular hypofunction: Secondary | ICD-10-CM

## 2024-01-24 DIAGNOSIS — J45909 Unspecified asthma, uncomplicated: Secondary | ICD-10-CM

## 2024-01-24 DIAGNOSIS — Z125 Encounter for screening for malignant neoplasm of prostate: Secondary | ICD-10-CM

## 2024-01-24 DIAGNOSIS — N529 Male erectile dysfunction, unspecified: Secondary | ICD-10-CM

## 2024-01-24 DIAGNOSIS — J4540 Moderate persistent asthma, uncomplicated: Secondary | ICD-10-CM | POA: Diagnosis not present

## 2024-01-24 DIAGNOSIS — Z7985 Long-term (current) use of injectable non-insulin antidiabetic drugs: Secondary | ICD-10-CM

## 2024-01-24 LAB — COMPREHENSIVE METABOLIC PANEL
ALT: 25 U/L (ref 0–53)
AST: 23 U/L (ref 0–37)
Albumin: 4.2 g/dL (ref 3.5–5.2)
Alkaline Phosphatase: 103 U/L (ref 39–117)
BUN: 19 mg/dL (ref 6–23)
CO2: 25 meq/L (ref 19–32)
Calcium: 9 mg/dL (ref 8.4–10.5)
Chloride: 101 meq/L (ref 96–112)
Creatinine, Ser: 1.17 mg/dL (ref 0.40–1.50)
GFR: 67.39 mL/min (ref >60.00)
Glucose, Bld: 135 mg/dL — ABNORMAL HIGH (ref 70–99)
Potassium: 3.6 meq/L (ref 3.5–5.1)
Sodium: 136 meq/L (ref 135–145)
Total Bilirubin: 0.6 mg/dL (ref 0.2–1.2)
Total Protein: 6.9 g/dL (ref 6.0–8.3)

## 2024-01-24 LAB — PSA: PSA: 0.52 ng/mL (ref 0.10–4.00)

## 2024-01-24 LAB — LIPID PANEL
Cholesterol: 148 mg/dL (ref 0–200)
HDL: 41 mg/dL (ref >39.00)
LDL Cholesterol: 79 mg/dL (ref 0–99)
NonHDL: 106.95
Total CHOL/HDL Ratio: 4
Triglycerides: 141 mg/dL (ref 0.0–149.0)
VLDL: 28.2 mg/dL (ref 0.0–40.0)

## 2024-01-24 LAB — HEMOGLOBIN A1C: Hgb A1c MFr Bld: 9.7 % — ABNORMAL HIGH (ref 4.6–6.5)

## 2024-01-24 LAB — MICROALBUMIN / CREATININE URINE RATIO
Creatinine,U: 72.5 mg/dL
Microalb Creat Ratio: 74.3 mg/g — ABNORMAL HIGH (ref 0.0–30.0)
Microalb, Ur: 53.8 mg/dL — ABNORMAL HIGH (ref 0.0–1.9)

## 2024-01-24 NOTE — Assessment & Plan Note (Signed)
He continues bipap, likes his new mask.  He has not seen a sleep specialist in along time.

## 2024-01-24 NOTE — Assessment & Plan Note (Signed)
Update renal function. Continue NSAID avoidance.

## 2024-01-24 NOTE — Patient Instructions (Signed)
VISIT SUMMARY:  During today's visit, we discussed your ongoing knee pain and instability following your previous knee revision surgery and fall. We also reviewed your diabetes management, hypertension, asthma, depression, testosterone deficiency, and other health concerns. We have made some adjustments to your treatment plan to better manage your symptoms and improve your overall health.  YOUR PLAN:  -KNEE PAIN: You have been experiencing knee pain and instability since your knee revision surgery and subsequent fall. We will continue to monitor your condition and recommend following up with your orthopedic specialist to determine if another revision surgery is needed.  -HYPERTENSION: Your blood pressure is stable with your current medication, Lisinopril-HCTZ. Please continue taking this medication as prescribed.  -TYPE 2 DIABETES MELLITUS: Your diabetes is currently managed with Ozempic, Metformin, Pioglitazone, and Jardiance. Given your recent A1c level of 8.5 and fasting glucose of 148, we may consider discontinuing Ozempic due to its potential impact on your gastroparesis and adding long-acting insulin. We will check your A1c and adjust your medications as needed.  -GASTROPARESIS: Gastroparesis is a condition that affects the stomach muscles and prevents proper stomach emptying. Since you experience severe nausea, vomiting, and diarrhea, we may re-evaluate your diagnosis without Ozempic and consider referring you to a gastroenterologist for further evaluation.  -ASTHMA: Your asthma is well-controlled with Fluticasone and a rescue inhaler as needed. Please continue your current medication regimen.  -DEPRESSION: Your depression is stable with your current medication. Please continue taking your medication as prescribed to manage irritability.  -TESTOSTERONE DEFICIENCY: You are on testosterone injections but have not been taking them consistently. It is important to take your injections regularly.  We will check your testosterone levels to ensure they are within the desired range.  -ERECTILE DYSFUNCTION: You are using Cialis as needed for erectile dysfunction, and it is effective. Please continue using it as needed.  -BENIGN PROSTATIC HYPERPLASIA: Benign Prostatic Hyperplasia is an enlarged prostate gland that can cause urinary symptoms. You are experiencing frequent urination, which may be exacerbated by your blood pressure medication. Please continue taking Flomax as prescribed.  -GENERAL HEALTH MAINTENANCE: Continue with your annual eye exams due to diabetes and family history of glaucoma, use of BiPAP for sleep apnea, immunotherapy for allergies, and Atorvastatin for cholesterol management. Ensure you are up-to-date on all necessary vaccinations, including the flu shot and COVID-19 booster. We may consider further evaluation of your gallbladder due to recurrent gastrointestinal symptoms and will check your creatinine and urine due to your history of a single kidney. Follow up with a podiatrist for foot care due to diabetes.  INSTRUCTIONS:  Please follow up with your orthopedic specialist regarding your knee pain and instability. We will check your A1c and consider adjusting your diabetes medications. You may need a referral to a gastroenterologist for your gastroparesis symptoms. Continue with your current medications and health maintenance routines, and ensure you are up-to-date on vaccinations. Follow up with a podiatrist for foot care.

## 2024-01-24 NOTE — Assessment & Plan Note (Addendum)
>>  ASSESSMENT AND PLAN FOR MODERATE PERSISTENT ASTHMA WITHOUT COMPLICATION WRITTEN ON 01/24/2024 10:23 AM BY O'SULLIVAN, Granvel Proudfoot, NP  Asthma has been well controlled- doing allergy shots which has been helpful. Continue advair.     >>ASSESSMENT AND PLAN FOR ASTHMA WRITTEN ON 01/24/2024 11:31 AM BY O'SULLIVAN, Jesscia Imm, NP  Stable on Advair, singulair , allergy shots, prn albuterol . Continue same.

## 2024-01-24 NOTE — Assessment & Plan Note (Signed)
Stable on Advair, singulair, allergy shots, prn albuterol. Continue same.

## 2024-01-24 NOTE — Assessment & Plan Note (Addendum)
He is not following with nephrology.  Update renal function today.

## 2024-01-24 NOTE — Assessment & Plan Note (Signed)
Maintained on synthroid 75 mcg.  Continue same, update tsh.

## 2024-01-24 NOTE — Assessment & Plan Note (Signed)
This is being followed by [facility]. He is taking testosterone injections at home.

## 2024-01-24 NOTE — Assessment & Plan Note (Signed)
Maintained on ozempic.  Has intermittent nausea/vomiting followed by diarrhea.  States this happens once every 3 months.

## 2024-01-24 NOTE — Assessment & Plan Note (Signed)
Stable with cialis prn.

## 2024-01-24 NOTE — Assessment & Plan Note (Signed)
He states that this was diagnosed by gastric emptying study at the [place]. I am surprised that they did his study while on ozempic as this can slow gastric emptying.  He will bring me a copy of the reports from the [place].  It sounds like he might also be having intermittent biliary colic symptoms. He states that he had a HIDA performed as well which he will bring to me.

## 2024-01-24 NOTE — Assessment & Plan Note (Signed)
Having chronic left knee pain- this is being managed by ortho.

## 2024-01-24 NOTE — Assessment & Plan Note (Addendum)
Lab Results  Component Value Date   PSA 0.33 04/05/2018   Clinically stable on flomax. Continue same.

## 2024-01-24 NOTE — Assessment & Plan Note (Signed)
BP Readings from Last 3 Encounters:  01/24/24 124/83  07/14/23 132/86  01/13/23 (!) 130/90   BP stable, continue losartan.

## 2024-01-24 NOTE — Assessment & Plan Note (Signed)
Stable, continues citalopram.

## 2024-01-24 NOTE — Assessment & Plan Note (Signed)
Lab Results  Component Value Date   CHOL 144 01/28/2021   HDL 34.30 (L) 01/28/2021   LDLCALC 78 01/28/2021   TRIG 159.0 (H) 01/28/2021   CHOLHDL 4 01/28/2021   Update lipid panel, continue atorvastatin.

## 2024-01-26 ENCOUNTER — Telehealth: Payer: Self-pay | Admitting: Family

## 2024-01-26 ENCOUNTER — Other Ambulatory Visit (HOSPITAL_BASED_OUTPATIENT_CLINIC_OR_DEPARTMENT_OTHER): Payer: Self-pay

## 2024-01-26 MED ORDER — BASAGLAR KWIKPEN 100 UNIT/ML ~~LOC~~ SOPN
10.0000 [IU] | PEN_INJECTOR | Freq: Every day | SUBCUTANEOUS | 2 refills | Status: DC
  Filled 2024-01-26: qty 3, 30d supply, fill #0

## 2024-01-26 MED ORDER — INSULIN PEN NEEDLE 31G X 5 MM MISC
4 refills | Status: AC
  Filled 2024-01-26: qty 100, 30d supply, fill #0

## 2024-01-26 NOTE — Telephone Encounter (Signed)
Electronic request sent for this 2 tests

## 2024-01-26 NOTE — Telephone Encounter (Signed)
A1C is quite high at 9.7.  I would like for him to add insulin.  I am sending an rx for Basaglar 10 units SQ daily.  I would like him to increase by 2 units every 3 days until he reaches a fasting sugar of 140 or less. Then stop and continue at that dose.   Cholesterol looks good.    PSA is normal.  There is some protein in his urine from the diabetes- continue losartan for kidney protection.

## 2024-01-26 NOTE — Telephone Encounter (Signed)
-----   Message from Lemont Fillers sent at 01/24/2024 10:19 AM EST ----- [email or web address]

## 2024-01-26 NOTE — Telephone Encounter (Signed)
Please request copy of HIDA scan and DM eye exam from [place] in [place].

## 2024-01-26 NOTE — Telephone Encounter (Signed)
Patient notified of results and plan of care with insulin specific instructions. He understands and will let us know at how many units he reaches the goal of 140 or less on fasting BG

## 2024-01-27 ENCOUNTER — Ambulatory Visit: Payer: Medicare PPO | Admitting: Family

## 2024-01-27 ENCOUNTER — Ambulatory Visit: Payer: Self-pay | Admitting: Family

## 2024-01-27 ENCOUNTER — Encounter: Payer: Self-pay | Admitting: Family

## 2024-01-27 VITALS — BP 124/72 | HR 107 | Temp 100.3°F | Ht 65.0 in | Wt 211.8 lb

## 2024-01-27 DIAGNOSIS — J111 Influenza due to unidentified influenza virus with other respiratory manifestations: Secondary | ICD-10-CM

## 2024-01-27 MED ORDER — OSELTAMIVIR PHOSPHATE 75 MG PO CAPS: 75.0000 mg | ORAL_CAPSULE | Freq: Two times a day (BID) | ORAL | 0 refills | Status: AC

## 2024-01-27 NOTE — Progress Notes (Signed)
[name] is a 63 y.o. male with the following history as recorded in Epic[employer]:  Patient Active Problem List   Diagnosis Date Noted   Gastroparesis 01/24/2024   RLS (restless legs syndrome) 01/28/2021   Moderate persistent asthma without complication 06/02/2019   Seasonal allergic conjunctivitis 01/02/2019   Deviated nasal septum 01/02/2019   History of kidney cancer 01/02/2019   H/O left radical nephrectomy 01/02/2019   Idiopathic urticaria 01/02/2019   Seasonal and perennial allergic rhinoconjunctivitis 01/02/2019   Degenerative lumbar spinal stenosis 11/10/2017   Fatty liver 03/08/2017   Morbid obesity (HCC) 01/06/2017   Hypothyroidism 08/28/2016   Solitary kidney, acquired 12/10/2015   Gastroesophageal reflux disease without esophagitis 12/10/2015   Erectile dysfunction 12/10/2015   BPH (benign prostatic hyperplasia) 12/10/2015   Hypogonadism in male 12/10/2015   Depression 12/10/2015   Renal cell carcinoma (HCC) 12/10/2015   Asthma 11/08/2015   Obstructive sleep apnea with use of bilevel positive airway pressure (BPAP) 11/08/2015   Essential hypertension 11/08/2015   Hypercholesterolemia 11/08/2015   Diabetes mellitus without complication (HCC) 11/08/2015   Chronic renal insufficiency 11/08/2015   S/P total knee arthroplasty 11/21/2012    Current Outpatient Medications  Medication Sig Dispense Refill   albuterol (VENTOLIN HFA) 108 (90 Base) MCG/ACT inhaler Inhale 2 puffs into the lungs every 6 (six) hours as needed for wheezing or shortness of breath. 8 g 2   atorvastatin (LIPITOR) 80 MG tablet Take 1 tablet by mouth daily.     cetirizine (ZYRTEC) 10 MG tablet Take 1 tablet by mouth at bedtime.     Cholecalciferol 50 MCG (2000 UT) TABS Take 2 tablets by mouth daily.     citalopram (CELEXA) 40 MG tablet Take 40 mg by mouth daily.     empagliflozin (JARDIANCE) 25 MG TABS tablet Take 25 mg by mouth daily.     EPINEPHrine 0.3 mg/0.3 mL IJ SOAJ injection Inject into  the muscle.     fexofenadine (ALLEGRA) 180 MG tablet Take 1 tablet by mouth daily.     fluticasone (FLONASE) 50 MCG/ACT nasal spray Administer 1 spray in each nostril daily as needed.     Fluticasone-Salmeterol (ADVAIR DISKUS) 250-50 MCG/DOSE AEPB Inhale 1 puff into the lungs 2 (two) times daily.  3   gabapentin (NEURONTIN) 600 MG tablet Take by mouth.     Glucagon 0.5 MG/0.1ML SOAJ Inject into the skin.     hydrochlorothiazide (HYDRODIURIL) 25 MG tablet Take 1 tablet by mouth daily.     hydrOXYzine (ATARAX) 10 MG tablet Take by mouth.     Insulin Glargine (BASAGLAR KWIKPEN) 100 UNIT/ML Inject 10 Units into the skin daily. 3 mL 2   Insulin Pen Needle 31G X 5 MM MISC Use as directed. 100 each 4   ketotifen (ZADITOR) 0.025 % ophthalmic solution Place 1 drop into both eyes 2 (two)  times daily as needed.     Lactobacillus (ACIDOPHILUS) CAPS capsule Take 2 capsules by mouth daily.     levothyroxine (SYNTHROID) 75 MCG tablet Take 1 tablet (75 mcg total) by mouth daily. 30 tablet 3   Lidocaine HCl 4 % LIQD Apply 1 application topically daily as needed.     losartan (COZAAR) 100 MG tablet Take 1 tablet by mouth daily.     metFORMIN (GLUCOPHAGE-XR) 500 MG 24 hr tablet Take 1,000 mg by mouth 2 (two) times daily.      montelukast (SINGULAIR) 10 MG tablet Take 1 tablet by mouth at bedtime.     NON FORMULARY  Allergen immunotherapy     olopatadine (PATANOL) 0.1 % ophthalmic solution Apply to eye.     Omega-3 1000 MG CAPS Take by mouth.     omeprazole (PRILOSEC) 20 MG capsule Take by mouth.     ondansetron (ZOFRAN-ODT) 4 MG disintegrating tablet DISSOLVE ONE TABLET MOUTH THREE TIMES A DAY AS NEEDED AS DIRECTED BY PROVIDER     oseltamivir (TAMIFLU) 75 MG capsule Take 1 capsule (75 mg total) by mouth 2 (two) times daily for 5 days. 10 capsule 0   pantoprazole (PROTONIX) 40 MG tablet Take 40 mg by mouth daily.     pioglitazone (ACTOS) 30 MG tablet Take by mouth.     pramipexole (MIRAPEX) 0.5 MG tablet Take  0.5 mg by mouth 3 (three) times daily.     Semaglutide,0.25 or 0.5MG /DOS, 2 MG/1.5ML SOPN Inject 0.5 mg into the skin once a week.     Simethicone LIQD      sodium chloride (OCEAN) 0.65 % nasal spray      tadalafil (CIALIS) 20 MG tablet Take by mouth.     tamsulosin (FLOMAX) 0.4 MG CAPS capsule TAKE 1 CAPSULE (0.4 MG TOTAL) BY MOUTH DAILY. 30 capsule 5   testosterone cypionate (DEPOTESTOSTERONE CYPIONATE) 200 MG/ML injection Inject 200 mg into the muscle every 14 (fourteen) days.     urea (CARMOL) 10 % cream Apply topically as needed.     No current facility-administered medications for this visit.    Allergies: Hydrocodone and Niacin and related  Past Medical History:  Diagnosis Date   Arthritis    Asthma    Cancer of kidney (HCC)    s/p L nephrectomy (renal cell carcinoma)   Carpal tunnel syndrome    Compression fracture of lumbar vertebra (HCC)    Depression    Deviated septum    Diabetes (HCC)    Fatty liver 03/08/2017   Fibromyalgia    Hepatitis A 1980   History of kidney cancer    Removed left kidney    Hypercholesteremia    Hypertension    Kidney disease    Migraine    Morbid obesity (HCC) 01/06/2017   Obesity    S/P revision of total knee, 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   Sinus congestion    Sleep apnea    Thyroid disease     Past Surgical History:  Procedure Laterality Date   ANKLE SURGERY     BACK SURGERY  2007   COLONOSCOPY WITH ESOPHAGOGASTRODUODENOSCOPY (EGD)  [facility]   HAND SURGERY Right    KNEE SURGERY Left    X2   NASAL SEPTUM SURGERY  02/22/2019   turbinate reduction   NEPHRECTOMY Left 2003   ROTATOR CUFF REPAIR Right     Family History  Problem Relation Age of Onset   Glaucoma Maternal Grandmother    Leukemia Maternal Grandfather    Heart attack Brother    Diabetes Brother    Allergies Brother    Diabetes Brother    Allergies Sister    Asthma Sister    Heart disease Mother    Rheumatologic disease Mother     Lupus Mother    Angioedema Mother    Irritable bowel syndrome Mother    Diverticulosis Mother    Other Mother 93       Colon Resecction    Heart disease Father    Diabetes Father    Liver disease Father    Rheumatologic disease Sister  Cancer Paternal Grandfather        leukemia   Asthma Child    Allergic rhinitis Child    Esophageal cancer Maternal Aunt    Eczema Neg Hx    Urticaria Neg Hx     Social History   Tobacco Use   Smoking status: Some Days    Current packs/day: 0.00    Average packs/day: 1 pack/day for 10.0 years (10.0 ttl pk-yrs)    Types: Cigarettes, Cigars    Start date: 89    Last attempt to quit: 1986    Years since quitting: 39.1    Passive exposure: Current   Smokeless tobacco: Never  Substance Use Topics   Alcohol use: Yes    Alcohol/week: 0.0 standard drinks of alcohol    Comment: occasionally    Subjective:   Patient started yesterday with cough/ congestion; tested positive for the flu today; complaining of chills/ body aches; also asking for strep test to be done due to sore throat;   Objective:  Vitals:   01/27/24 1535  BP: 124/72  Pulse: (!) 107  Temp: 100.3 F (37.9 C)  TempSrc: Oral  SpO2: 93%  Weight: 211 lb 12.8 oz (96.1 kg)  Height: 5\' 5"  (1.651 m)    General: Well developed, well nourished, in no acute distress  Skin : Warm and dry.  Head: Normocephalic and atraumatic  Eyes: Sclera and conjunctiva clear; pupils round and reactive to light; extraocular movements intact  Ears: External normal; canals clear; tympanic membranes normal  Oropharynx: Pink, supple. No suspicious lesions  Neck: Supple without thyromegaly, adenopathy  Lungs: Respirations unlabored; clear to auscultation bilaterally without wheeze, rales, rhonchi  CVS exam: normal rate and regular rhythm.  Neurologic: Alert and oriented; speech intact; face symmetrical; moves all extremities well; CNII-XII intact without focal deficit   Assessment:  1. Influenza      Plan:  Patient had rapid flu test at home today which was positive- will start Tamiflu 75 mg bid x 5 days; rapid strep was negative; stressed need to take his Advair and use albuterol. He defers prescriptive cough syrup; continue Tylenol, fluids, rest; encouraged to stay at home through the weekend; follow up worse, no better.   No follow-ups on file.  No orders of the defined types were placed in this encounter.   Requested Prescriptions   Signed Prescriptions Disp Refills   oseltamivir (TAMIFLU) 75 MG capsule 10 capsule 0    Sig: Take 1 capsule (75 mg total) by mouth 2 (two) times daily for 5 days.

## 2024-01-27 NOTE — Telephone Encounter (Signed)
Pt has an appt today with Vernona Rieger today

## 2024-01-27 NOTE — Telephone Encounter (Signed)
Copied from CRM [id number]. Topic: Appointments - Appointment Scheduling >> Jan 27, 2024  1:48 PM [place] A wrote: Patient has chills, coughing, nasal congestion, eyes hurt, body aches since last night-Symptoms started mid-day yesterday; has been taking Robitussin taking Alkazelter during the day   Chief Complaint: cough and sore throat Symptoms: chest tightness, nasal congestion, body aches Frequency: constant Pertinent Negatives: Patient denies fever Disposition: [] ED /[] Urgent Care (no appt availability in office) / [x] Appointment(In office/virtual)/ []  [facility]/ [] Home Care/ [] Refused Recommended Disposition /[]  Mobile Bus/ []  Follow-up with PCP Additional Notes: Patient reports cough and nasal congestion that started yesterday., Pt expressed chest tightness and shortness of breath when coughing or with exertion. Pt sts that he had to use his rescue inhaler today. Appt scheduled today at [facility] at 3:40 pm  Reason for Disposition  [1] MILD difficulty breathing (e.g., minimal/no SOB at rest, SOB with walking, pulse <100) AND [2] still present when not coughing  Answer Assessment - Initial Assessment Questions 1. ONSET: "When did the cough begin?"      Started yesterday  2. SEVERITY: "How bad is the cough today?"      *No Answer* 3. SPUTUM: "Describe the color of your sputum" (none, dry cough; clear, white, yellow, green)     White sputum  4. HEMOPTYSIS: "Are you coughing up any blood?" If so ask: "How much?" (flecks, streaks, tablespoons, etc.)     No blood  5. DIFFICULTY BREATHING: "Are you having difficulty breathing?" If Yes, ask: "How bad is it?" (e.g., mild, moderate, severe)    - MILD: No SOB at rest, mild SOB with walking, speaks normally in sentences, can lie down, no retractions, pulse < 100.    - MODERATE: SOB at rest, SOB with minimal exertion and prefers to sit, cannot lie down flat, speaks in phrases, mild retractions, audible wheezing,  pulse 100-120.    - SEVERE: Very SOB at rest, speaks in single words, struggling to breathe, sitting hunched forward, retractions, pulse > 120      Mild, when moving. Feels tightness in chest  6. FEVER: "Do you have a fever?" If Yes, ask: "What is your temperature, how was it measured, and when did it start?"     Has not checked, but feels very cold. Eyes feel like burning  7. CARDIAC HISTORY: "Do you have any history of heart disease?" (e.g., heart attack, congestive heart failure)      No  8. LUNG HISTORY: "Do you have any history of lung disease?"  (e.g., pulmonary embolus, asthma, emphysema)     Asthma, OSA  9. PE RISK FACTORS: "Do you have a history of blood clots?" (or: recent major surgery, recent prolonged travel, bedridden)     No  10. OTHER SYMPTOMS: "Do you have any other symptoms?" (e.g., runny nose, wheezing, chest pain)       Nasal congestion, chills, body aches, eyes hurt, sore throat  11. PREGNANCY: "Is there any chance you are pregnant?" "When was your last menstrual period?"       N/a  12. TRAVEL: "Have you traveled out of the country in the last month?" (e.g., travel history, exposures)       No  Protocols used: Cough - Acute Productive-A-AH

## 2024-02-07 ENCOUNTER — Ambulatory Visit (INDEPENDENT_AMBULATORY_CARE_PROVIDER_SITE_OTHER): Payer: No Typology Code available for payment source

## 2024-02-07 ENCOUNTER — Other Ambulatory Visit (HOSPITAL_BASED_OUTPATIENT_CLINIC_OR_DEPARTMENT_OTHER): Payer: Self-pay

## 2024-02-07 DIAGNOSIS — J309 Allergic rhinitis, unspecified: Secondary | ICD-10-CM

## 2024-02-09 ENCOUNTER — Ambulatory Visit: Payer: No Typology Code available for payment source | Admitting: Internal Medicine

## 2024-02-09 ENCOUNTER — Other Ambulatory Visit (HOSPITAL_BASED_OUTPATIENT_CLINIC_OR_DEPARTMENT_OTHER): Payer: Self-pay

## 2024-02-09 ENCOUNTER — Other Ambulatory Visit: Payer: Self-pay

## 2024-02-09 VITALS — BP 126/86 | HR 97 | Temp 97.8°F | Resp 18 | Ht 64.0 in | Wt 208.8 lb

## 2024-02-09 DIAGNOSIS — H1010 Acute atopic conjunctivitis, unspecified eye: Secondary | ICD-10-CM

## 2024-02-09 DIAGNOSIS — J302 Other seasonal allergic rhinitis: Secondary | ICD-10-CM

## 2024-02-09 DIAGNOSIS — H1045 Other chronic allergic conjunctivitis: Secondary | ICD-10-CM | POA: Diagnosis not present

## 2024-02-09 DIAGNOSIS — J3089 Other allergic rhinitis: Secondary | ICD-10-CM | POA: Diagnosis not present

## 2024-02-09 DIAGNOSIS — J4541 Moderate persistent asthma with (acute) exacerbation: Secondary | ICD-10-CM | POA: Diagnosis not present

## 2024-02-09 DIAGNOSIS — H1013 Acute atopic conjunctivitis, bilateral: Secondary | ICD-10-CM | POA: Diagnosis not present

## 2024-02-09 MED ORDER — METHYLPREDNISOLONE ACETATE 40 MG/ML IJ SUSP
40.0000 mg | Freq: Once | INTRAMUSCULAR | Status: AC
  Administered 2024-02-09: 40 mg via INTRAMUSCULAR

## 2024-02-09 MED ORDER — AZITHROMYCIN 250 MG PO TABS
ORAL_TABLET | ORAL | 0 refills | Status: DC
Start: 2024-02-09 — End: 2024-06-06
  Filled 2024-02-09: qty 6, 5d supply, fill #0

## 2024-02-09 MED ORDER — AZITHROMYCIN 250 MG PO TABS: ORAL_TABLET | ORAL | 0 refills | Status: DC

## 2024-02-09 MED ORDER — PREDNISONE 10 MG PO TABS
ORAL_TABLET | ORAL | 0 refills | Status: DC
  Filled 2024-02-09: qty 15, 5d supply, fill #0

## 2024-02-09 MED ORDER — PREDNISONE 10 MG PO TABS: ORAL_TABLET | ORAL | 0 refills | Status: AC

## 2024-02-09 NOTE — Progress Notes (Signed)
 Follow Up Note  RE: [name] MRN: [id number] DOB: 25-May-1961 Date of Office Visit: 02/09/2024  Referring provider: Sandford Craze, NP Primary care provider: Sandford Craze, NP  Chief Complaint: Cough (Cough x 1 month s/p flu and medication tamiflu)  History of Present Illness: I had the pleasure of seeing [name] for a follow up visit at the [facility] on 02/21/2024. He is a 63 y.o. male, who is being followed for allergic rhinoconjunctivitis on AIT, moderate persistent asthma, recurrent sinus infections. His previous allergy office visit was on 07/14/23  with Dr. Marlynn Perking. Today is a regular follow up visit.  History obtained from patient, chart review.  At last visit he was treated for acute sinus infection with steroids and Augmentin.  Today he reports:  He had the flu about a month ago, experiencing fevers, chills, shortness of breath, and a cough, for which he was treated with Tamiflu. Since recovering, he has had a persistent cough that sometimes leads to shortness of breath, especially when descending stairs. The cough can be severe, causing difficulty in stopping and resulting in shortness of breath. Deep breaths often trigger the cough, and he occasionally expectorates thick, white mucus.  A few nights ago, he experienced a sudden episode of palpitations while watching a movie, accompanied by chest tightness, difficulty inhaling, and an inability to speak due to breathlessness. He felt lightheaded and saw white, fearing he might pass out. The episode ended with intense coughing and expectoration of white mucus. He has not experienced similar episodes before or since, and no palpitations or dizzy spells have occurred since the initial episode.  He uses Wixela regularly and a rescue inhaler three to four times a week, sometimes twice a day, which helps relieve chest pressure and tightness. He compares the severity of his symptoms to his  first asthma attack, which also caused panic.   Pertinent History/Diagnostics:  - Asthma: moderate persistent  - normal  spirometry (09/28/22): ratio 83, 3.74, 100%   - Total IgE 1669 (6/524); AEC 400 03/11/21 - Allergic Rhinitis:   - SPT environmental panel (01/02/19): G-RW-T-M)  - Vial 1 (G-RW-T), Vial 2 (M), started in 03/2019   Review of systems positive for some choking while eating.  Feels like it is going "down the wrong pipe" versus difficulty swallowing.  He has not had a barium swallow.    Assessment and Plan: [name] is a 63 y.o. male with: Moderate persistent asthma with (acute) exacerbation - Plan: Spirometry with Graph  Seasonal and perennial allergic rhinitis  Other chronic allergic conjunctivitis of both eyes  Seasonal allergic conjunctivitis Plan: Patient Instructions  Moderate Persistent  Asthma: with acute excerbaton after FLU   - Breathing test looked good today, but based on symptoms will treat  - Depo medrol 40mg  IM  - Starting tomorrow: Prednisone 10mg  : Take 2 tablets twice a day for 3 more days, Then take 2 tablets once a day for 1 day., then take 1 tablet once a day for 1 day.  - Azithromycin 500mg  on day 1, 250mg  daily for the next 4 days    Continue other meds as below:   - Daily controller medication(s): Singulair 10mg  daily and  Advair 500 mcg 1 puff twice daily  - Prior to physical activity: albuterol 2 puffs 10-15 minutes before physical activity. - Rescue medications: albuterol 4 puffs every 4-6 hours as needed - Get Influenza Vaccine and appropriate Pneumonia and COVID 19 boosters  - Asthma control  goals:  * Full participation in all desired activities (may need albuterol before activity) * Albuterol use two time or less a week on average (not counting use with activity) * Cough interfering with sleep two time or less a month * Oral steroids no more than once a year * No hospitalizations   Allergic  Rhinitis:  -  Continue Avoidance  measures  - Continue with: Allegra (fexofenadine) 180mg  table once daily, Zyrtec 10mg  at night, Flonase (fluticasone) two sprays per nostril daily as needed, and Pataday (olopatadine) one drop per eye twice daily as needed -  Continue Allergy Injections, goal is 3-5 years of maintenance dosing  - Continue Atrovent (ipatopium) nasal spray 1-2 sprays in each nostril up to three times daily AS NEEDED for POST NASAL DRIP/RUNNY NOSE/DRAINAGE.  If you become too dry, use less often.    Allergic Conjunctivitis:  - Continue Allergen avoidance as instructed - Avoiding rubbing eyes, if irritated use a wet wash cloth to wipe allergen out of eyes  - Continue  great options include Pataday (Olopatadine)  eye drops   -Avoid eye drops that say red eye relief as they may contain medications that dry out your eyes.  Follow up: 6 months   Thank you so much for letting me partake in your care today.  Don't hesitate to reach out if you have any additional concerns!  Ferol Luz, MD  [facility] [place] [place] No follow-ups on file.  Meds ordered this encounter  Medications   DISCONTD: predniSONE (DELTASONE) 10 MG tablet    Sig: Take 2 tablets (20 mg total) by mouth 2 (two) times daily for 3 days, THEN 2 tablets (20 mg total) daily for 1 day, THEN 1 tablet (10 mg total) daily for 1 day.    Dispense:  15 tablet    Refill:  0   azithromycin (ZITHROMAX Z-PAK) 250 MG tablet    Sig: Take 2 tabs on day one, then take 1 tab daily for 4 more days    Dispense:  6 each    Refill:  0   methylPREDNISolone acetate (DEPO-MEDROL) injection 40 mg   azithromycin (ZITHROMAX Z-PAK) 250 MG tablet    Sig: Take  ( 2) two tablets on day one, then (1) one tablet for four more days    Dispense:  6 each    Refill:  0   predniSONE (DELTASONE) 10 MG tablet    Sig: Take 2 tablets (20 mg total) by mouth 2 (two) times daily for 3 days, THEN 2 tablets (20 mg total) daily for 1 day, THEN 1 tablet (10 mg total)  daily for 1 day.    Dispense:  15 tablet    Refill:  0    Lab Orders  No laboratory test(s) ordered today   Diagnostics: Spirometry:  Tracings reviewed. His effort: Good reproducible efforts. FVC: 2.74 L FEV1: 2.42 L, 90% predicted FEV1/FVC ratio: 88% Interpretation: Spirometry consistent with normal pattern.  Please see scanned spirometry results for details.   Results interpreted by myself during this encounter and discussed with patient/family.   Medication List:  Current Outpatient Medications  Medication Sig Dispense Refill   albuterol (VENTOLIN HFA) 108 (90 Base) MCG/ACT inhaler Inhale 2 puffs into the lungs every 6 (six) hours as needed for wheezing or shortness of breath. 8 g 2   atorvastatin (LIPITOR) 80 MG tablet Take 1 tablet by mouth daily.     azithromycin (ZITHROMAX Z-PAK) 250 MG tablet Take 2 tabs  on day one, then take 1 tab daily for 4 more days 6 each 0   azithromycin (ZITHROMAX Z-PAK) 250 MG tablet Take  ( 2) two tablets on day one, then (1) one tablet for four more days 6 each 0   cetirizine (ZYRTEC) 10 MG tablet Take 1 tablet by mouth at bedtime.     Cholecalciferol 50 MCG (2000 UT) TABS Take 2 tablets by mouth daily.     citalopram (CELEXA) 40 MG tablet Take 40 mg by mouth daily.     empagliflozin (JARDIANCE) 25 MG TABS tablet Take 25 mg by mouth daily.     EPINEPHrine 0.3 mg/0.3 mL IJ SOAJ injection Inject into the muscle.     fexofenadine (ALLEGRA) 180 MG tablet Take 1 tablet by mouth daily.     fluticasone (FLONASE) 50 MCG/ACT nasal spray Administer 1 spray in each nostril daily as needed.     Fluticasone-Salmeterol (ADVAIR DISKUS) 250-50 MCG/DOSE AEPB Inhale 1 puff into the lungs 2 (two) times daily.  3   gabapentin (NEURONTIN) 600 MG tablet Take by mouth.     Glucagon 0.5 MG/0.1ML SOAJ Inject into the skin.     hydrochlorothiazide (HYDRODIURIL) 25 MG tablet Take 1 tablet by mouth daily.     hydrOXYzine (ATARAX) 10 MG tablet Take by mouth.     Insulin  Glargine (BASAGLAR KWIKPEN) 100 UNIT/ML Inject 10 Units into the skin daily. 3 mL 2   Insulin Pen Needle 31G X 5 MM MISC Use as directed. 100 each 4   ketotifen (ZADITOR) 0.025 % ophthalmic solution Place 1 drop into both eyes 2 (two)  times daily as needed.     Lactobacillus (ACIDOPHILUS) CAPS capsule Take 2 capsules by mouth daily.     levothyroxine (SYNTHROID) 75 MCG tablet Take 1 tablet (75 mcg total) by mouth daily. 30 tablet 3   Lidocaine HCl 4 % LIQD Apply 1 application topically daily as needed.     losartan (COZAAR) 100 MG tablet Take 1 tablet by mouth daily.     metFORMIN (GLUCOPHAGE-XR) 500 MG 24 hr tablet Take 1,000 mg by mouth 2 (two) times daily.      montelukast (SINGULAIR) 10 MG tablet Take 1 tablet by mouth at bedtime.     NON FORMULARY Allergen immunotherapy     olopatadine (PATANOL) 0.1 % ophthalmic solution Apply to eye.     Omega-3 1000 MG CAPS Take by mouth.     omeprazole (PRILOSEC) 20 MG capsule Take by mouth.     ondansetron (ZOFRAN-ODT) 4 MG disintegrating tablet DISSOLVE ONE TABLET MOUTH THREE TIMES A DAY AS NEEDED AS DIRECTED BY PROVIDER     pantoprazole (PROTONIX) 40 MG tablet Take 40 mg by mouth daily.     pioglitazone (ACTOS) 30 MG tablet Take by mouth.     pramipexole (MIRAPEX) 0.5 MG tablet Take 0.5 mg by mouth 3 (three) times daily.     Semaglutide,0.25 or 0.5MG /DOS, 2 MG/1.5ML SOPN Inject 0.5 mg into the skin once a week.     Simethicone LIQD      sodium chloride (OCEAN) 0.65 % nasal spray      tadalafil (CIALIS) 20 MG tablet Take by mouth.     tamsulosin (FLOMAX) 0.4 MG CAPS capsule TAKE 1 CAPSULE (0.4 MG TOTAL) BY MOUTH DAILY. 30 capsule 5   testosterone cypionate (DEPOTESTOSTERONE CYPIONATE) 200 MG/ML injection Inject 200 mg into the muscle every 14 (fourteen) days.     urea (CARMOL) 10 % cream Apply topically as needed.  No current facility-administered medications for this visit.   Allergies: Allergies  Allergen Reactions   Hydrocodone Itching    Niacin And Related Hives   I reviewed his past medical history, social history, family history, and environmental history and no significant changes have been reported from his previous visit.  ROS: All others negative except as noted per HPI.   Objective: BP 126/86   Pulse 97   Temp 97.8 F (36.6 C) (Temporal)   Resp 18   Ht 5\' 4"  (1.626 m)   Wt 208 lb 12.8 oz (94.7 kg)   SpO2 99%   BMI 35.84 kg/m  Body mass index is 35.84 kg/m. General Appearance:  Alert, cooperative, no distress, appears stated age  Head:  Normocephalic, without obvious abnormality, atraumatic  Eyes:  Conjunctiva clear, EOM's intact  Nose: Nares normal, erythematous nasal mucosa, no rhinnorhea   Throat: Lips, tongue normal; teeth and gums normal,   Neck: Supple, symmetrical  Lungs:   clear to auscultation bilaterally, Respirations unlabored, no coughing  Heart:  regular rate and rhythm and no murmur, Appears well perfused  Extremities: No edema  Skin: Skin color, texture, turgor normal, no rashes or lesions on visualized portions of skin   Neurologic: No gross deficits   Previous notes and tests were reviewed. The plan was reviewed with the patient/family, and all questions/concerned were addressed.  It was my pleasure to see [name] today and participate in his care. Please feel free to contact me with any questions or concerns.  Sincerely,  Ferol Luz, MD  Allergy & Immunology  [facility] Office: [phone]

## 2024-02-09 NOTE — Patient Instructions (Addendum)
Moderate Persistent  Asthma: with acute excerbaton after FLU   - Breathing test looked good today, but based on symptoms will treat  - Depo medrol 40mg  IM  - Starting tomorrow: Prednisone 10mg  : Take 2 tablets twice a day for 3 more days, Then take 2 tablets once a day for 1 day., then take 1 tablet once a day for 1 day.  - Azithromycin 500mg  on day 1, 250mg  daily for the next 4 days    Continue other meds as below:   - Daily controller medication(s): Singulair 10mg  daily and  Advair 500 mcg 1 puff twice daily  - Prior to physical activity: albuterol 2 puffs 10-15 minutes before physical activity. - Rescue medications: albuterol 4 puffs every 4-6 hours as needed - Get Influenza Vaccine and appropriate Pneumonia and COVID 19 boosters  - Asthma control goals:  * Full participation in all desired activities (may need albuterol before activity) * Albuterol use two time or less a week on average (not counting use with activity) * Cough interfering with sleep two time or less a month * Oral steroids no more than once a year * No hospitalizations   Allergic  Rhinitis:  -  Continue Avoidance measures  - Continue with: Allegra (fexofenadine) 180mg  table once daily, Zyrtec 10mg  at night, Flonase (fluticasone) two sprays per nostril daily as needed, and Pataday (olopatadine) one drop per eye twice daily as needed -  Continue Allergy Injections, goal is 3-5 years of maintenance dosing  - Continue Atrovent (ipatopium) nasal spray 1-2 sprays in each nostril up to three times daily AS NEEDED for POST NASAL DRIP/RUNNY NOSE/DRAINAGE.  If you become too dry, use less often.    Allergic Conjunctivitis:  - Continue Allergen avoidance as instructed - Avoiding rubbing eyes, if irritated use a wet wash cloth to wipe allergen out of eyes  - Continue  great options include Pataday (Olopatadine)  eye drops   -Avoid eye drops that say red eye relief as they may contain medications that dry out your  eyes.  Follow up: 6 months   Thank you so much for letting me partake in your care today.  Don't hesitate to reach out if you have any additional concerns!  Ferol Luz, MD  [facility] [place], [place]

## 2024-02-21 ENCOUNTER — Other Ambulatory Visit (HOSPITAL_BASED_OUTPATIENT_CLINIC_OR_DEPARTMENT_OTHER): Payer: Self-pay

## 2024-03-02 ENCOUNTER — Ambulatory Visit (INDEPENDENT_AMBULATORY_CARE_PROVIDER_SITE_OTHER)

## 2024-03-02 DIAGNOSIS — J309 Allergic rhinitis, unspecified: Secondary | ICD-10-CM

## 2024-03-21 ENCOUNTER — Telehealth: Payer: Self-pay | Admitting: Family

## 2024-03-21 NOTE — Telephone Encounter (Signed)
 Copied from CRM [id number]. Topic: Medicare AWV >> Mar 21, 2024  9:31 AM Payton Doughty wrote: Reason for CRM: Called LVM 03/21/2024 to schedule AWV. Please schedule Virtual or Telehealth visits ONLY.   Verlee Rossetti; Care Guide Ambulatory Clinical Support [place] [facility] Direct Dial: [phone]

## 2024-04-26 LAB — HM DIABETES EYE EXAM

## 2024-04-27 ENCOUNTER — Ambulatory Visit (INDEPENDENT_AMBULATORY_CARE_PROVIDER_SITE_OTHER): Payer: Self-pay

## 2024-04-27 DIAGNOSIS — J309 Allergic rhinitis, unspecified: Secondary | ICD-10-CM

## 2024-05-04 ENCOUNTER — Ambulatory Visit (INDEPENDENT_AMBULATORY_CARE_PROVIDER_SITE_OTHER): Payer: Self-pay

## 2024-05-04 DIAGNOSIS — J309 Allergic rhinitis, unspecified: Secondary | ICD-10-CM | POA: Diagnosis not present

## 2024-05-11 ENCOUNTER — Ambulatory Visit (INDEPENDENT_AMBULATORY_CARE_PROVIDER_SITE_OTHER)

## 2024-05-11 DIAGNOSIS — J309 Allergic rhinitis, unspecified: Secondary | ICD-10-CM

## 2024-05-18 ENCOUNTER — Ambulatory Visit: Payer: Self-pay

## 2024-06-06 ENCOUNTER — Ambulatory Visit (INDEPENDENT_AMBULATORY_CARE_PROVIDER_SITE_OTHER): Admitting: *Deleted

## 2024-06-06 VITALS — BP 140/90 | HR 87 | Temp 98.7°F | Resp 18 | Ht 64.0 in | Wt 206.0 lb

## 2024-06-06 DIAGNOSIS — Z0000 Encounter for general adult medical examination without abnormal findings: Secondary | ICD-10-CM | POA: Diagnosis not present

## 2024-06-06 NOTE — Patient Instructions (Addendum)
 [name] , Thank you for taking time out of your busy schedule to complete your Annual Wellness Visit with me. I enjoyed our conversation and look forward to speaking with you again next year. I, as well as your care team,  appreciate your ongoing commitment to your health goals. Please review the following plan we discussed and let me know if I can assist you in the future. Your Game plan/ To Do List     Please forward the name of your foot doctor and eye doctor from the [place] so we can request those records.  Follow up Visits: Next Medicare AWV with our clinical staff: 06/08/24 3pm   Next Office Visit with your provider: 06/20/24 12:20pm   Clinician Recommendations:  Aim for 30 minutes of exercise or brisk walking, 6-8 glasses of water, and 5 servings of fruits and vegetables each day.       This is a list of the screening recommended for you and due dates:  Health Maintenance  Topic Date Due   Medicare Annual Wellness Visit  Never done   Pneumococcal Vaccination (2 of 2 - PCV) 06/08/2018   DTaP/Tdap/Td vaccine (2 - Tdap) 12/28/2018   Eye exam for diabetics  05/19/2019   Complete foot exam   01/28/2022   COVID-19 Vaccine (6 - 2024-25 season) 08/29/2023   Hemoglobin A1C  07/23/2024   Flu Shot  07/28/2024   Yearly kidney function blood test for diabetes  01/23/2025   Yearly kidney health urinalysis for diabetes  01/23/2025   Colon Cancer Screening  12/04/2025   Hepatitis C Screening  Completed   HIV Screening  Completed   Zoster (Shingles) Vaccine  Completed   HPV Vaccine  Aged Out   Meningitis B Vaccine  Aged Out   When you complete your directives and have them notarized you may return them to us  by either of the following.  Advanced directives: (Copy Requested) Please bring a copy of your health care power of attorney and living will to the office to be added to your chart at your convenience. You can mail to [facility] [address] [place], [place] [address] or  email to [email or web address] Advance Care Planning is important because it:  [x]  Makes sure you receive the medical care that is consistent with your values, goals, and preferences  [x]  It provides guidance to your family and loved ones and reduces their decisional burden about whether or not they are making the right decisions based on your wishes.  Follow the link provided in your after visit summary or read over the paperwork we have mailed to you to help you started getting your Advance Directives in place. If you need assistance in completing these, please reach out to us  so that we can help you!  See attachments for Preventive Care and Fall Prevention Tips.

## 2024-06-06 NOTE — Progress Notes (Signed)
 Subjective:   [name] is a 63 y.o. who presents for a Medicare Wellness preventive visit.  As a reminder, Annual Wellness Visits don't include a physical exam, and some assessments may be limited, especially if this visit is performed virtually. We may recommend an in-person follow-up visit with your provider if needed.  Visit Complete: In person   Persons Participating in Visit: Patient.  AWV Questionnaire: Yes: Patient Medicare AWV questionnaire was completed by the patient on 06/05/24; I have confirmed that all information answered by patient is correct and no changes since this date.  Cardiac Risk Factors include: diabetes mellitus;hypertension;dyslipidemia;obesity (BMI >30kg/m2);advanced age (>98men, >64 women)     Objective:     Today's Vitals   06/06/24 1451 06/06/24 1556  BP: (!) 130/90 (!) 140/90  Pulse: 87   Resp: 18   Temp: 98.7 F (37.1 C)   TempSrc: Oral   Weight: 206 lb (93.4 kg)   Height: 5\' 4"  (1.626 m)    Body mass index is 35.36 kg/m.     06/06/2024    3:27 PM 06/13/2022    7:09 PM 04/05/2020   12:21 PM  Advanced Directives  Does Patient Have a Medical Advance Directive? No No No  Would patient like information on creating a medical advance directive? Yes (MAU/Ambulatory/Procedural Areas - Information given)      Current Medications (verified) Outpatient Encounter Medications as of 06/06/2024  Medication Sig   albuterol  (VENTOLIN  HFA) 108 (90 Base) MCG/ACT inhaler Inhale 2 puffs into the lungs every 6 (six) hours as needed for wheezing or shortness of breath.   atorvastatin (LIPITOR) 80 MG tablet Take 1 tablet by mouth daily.   cetirizine (ZYRTEC) 10 MG tablet Take 1 tablet by mouth at bedtime.   Cholecalciferol 50 MCG (2000 UT) TABS Take 2 tablets by mouth daily.   citalopram (CELEXA) 40 MG tablet Take 40 mg by mouth daily.   empagliflozin (JARDIANCE) 25 MG TABS tablet Take 25 mg by mouth daily.   EPINEPHrine  0.3 mg/0.3 mL IJ SOAJ  injection Inject into the muscle.   fexofenadine  (ALLEGRA ) 180 MG tablet Take 1 tablet by mouth daily.   fluticasone  (FLONASE) 50 MCG/ACT nasal spray Administer 1 spray in each nostril daily as needed.   Fluticasone -Salmeterol (ADVAIR DISKUS) 250-50 MCG/DOSE AEPB Inhale 1 puff into the lungs 2 (two) times daily.   gabapentin (NEURONTIN) 600 MG tablet Take by mouth.   Glucagon 0.5 MG/0.1ML SOAJ Inject into the skin.   hydrochlorothiazide (HYDRODIURIL) 25 MG tablet Take 1 tablet by mouth daily.   hydrOXYzine (ATARAX) 10 MG tablet Take by mouth.   Insulin  Glargine (BASAGLAR  KWIKPEN) 100 UNIT/ML Inject 10 Units into the skin daily.   Insulin  Pen Needle 31G X 5 MM MISC Use as directed.   ketotifen (ZADITOR) 0.025 % ophthalmic solution Place 1 drop into both eyes 2 (two)  times daily as needed.   Lactobacillus (ACIDOPHILUS) CAPS capsule Take 2 capsules by mouth daily.   levothyroxine  (SYNTHROID ) 75 MCG tablet Take 1 tablet (75 mcg total) by mouth daily.   Lidocaine HCl 4 % LIQD Apply 1 application topically daily as needed.   losartan (COZAAR) 100 MG tablet Take 1 tablet by mouth daily.   metFORMIN (GLUCOPHAGE-XR) 500 MG 24 hr tablet Take 1,000 mg by mouth 2 (two) times daily.    montelukast  (SINGULAIR ) 10 MG tablet Take 1 tablet by mouth at bedtime.   NON FORMULARY Allergen immunotherapy   olopatadine  (PATANOL) 0.1 % ophthalmic solution Apply to  eye.   Omega-3 1000 MG CAPS Take by mouth.   omeprazole (PRILOSEC) 20 MG capsule Take by mouth.   ondansetron  (ZOFRAN -ODT) 4 MG disintegrating tablet DISSOLVE ONE TABLET MOUTH THREE TIMES A DAY AS NEEDED AS DIRECTED BY PROVIDER   pioglitazone (ACTOS) 30 MG tablet Take by mouth.   pramipexole (MIRAPEX) 0.5 MG tablet Take 0.5 mg by mouth 3 (three) times daily.   Semaglutide,0.25 or 0.5MG /DOS, 2 MG/1.5ML SOPN Inject 0.5 mg into the skin once a week.   Simethicone LIQD    sodium chloride  (OCEAN) 0.65 % nasal spray    tadalafil (CIALIS) 20 MG tablet Take by  mouth.   tamsulosin  (FLOMAX ) 0.4 MG CAPS capsule TAKE 1 CAPSULE (0.4 MG TOTAL) BY MOUTH DAILY.   testosterone cypionate (DEPOTESTOSTERONE CYPIONATE) 200 MG/ML injection Inject 200 mg into the muscle every 14 (fourteen) days.   urea (CARMOL) 10 % cream Apply topically as needed.   [DISCONTINUED] azithromycin  (ZITHROMAX  Z-PAK) 250 MG tablet Take 2 tabs on day one, then take 1 tab daily for 4 more days   [DISCONTINUED] azithromycin  (ZITHROMAX  Z-PAK) 250 MG tablet Take  ( 2) two tablets on day one, then (1) one tablet for four more days   [DISCONTINUED] pantoprazole  (PROTONIX ) 40 MG tablet Take 40 mg by mouth daily.   No facility-administered encounter medications on file as of 06/06/2024.    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   Sinus congestion    Sleep apnea    Thyroid  disease    Past Surgical History:  Procedure Laterality Date   ANKLE SURGERY     BACK SURGERY  2007   COLONOSCOPY WITH ESOPHAGOGASTRODUODENOSCOPY (EGD)  [facility] in the [facility]   HAND SURGERY Right    KNEE SURGERY Left    X2   NASAL SEPTUM SURGERY  02/22/2019   turbinate reduction   NEPHRECTOMY Left 2003   ROTATOR CUFF REPAIR Right    Family History  Problem Relation Age of Onset   Glaucoma Maternal Grandmother    Leukemia Maternal Grandfather    Heart attack Brother    Diabetes Brother    Allergies Brother    Diabetes  Brother    Allergies Sister    Asthma Sister    Heart disease Mother    Rheumatologic disease Mother    Lupus Mother    Angioedema Mother    Irritable bowel syndrome Mother    Diverticulosis Mother    Other Mother 58       Colon Resecction    Heart disease Father    Diabetes Father    Liver disease Father    Rheumatologic disease Sister    Cancer Paternal Grandfather        leukemia   Asthma  Child    Allergic rhinitis Child    Esophageal cancer Maternal Aunt    Eczema Neg Hx    Urticaria Neg Hx    Social History   Socioeconomic History   Marital status: Married    Spouse name: Not on file   Number of children: 1   Years of education: Not on file   Highest education level: Associate degree: occupational, Scientist, product/process development, or vocational program  Occupational History   Occupation: Retired  Retail banker   Tobacco Use   Smoking status: Some Days    Types: Cigars, Cigarettes    Start date: 1979    Passive exposure: Current   Smokeless tobacco: Never  Vaping Use   Vaping status: Never Used  Substance and Sexual Activity   Alcohol use: Yes    Alcohol/week: 0.0 standard drinks of alcohol    Comment: occasionally   Drug use: Never   Sexual activity: Yes  Other Topics Concern   Not on file  Social History Narrative   1 son born 2004 [place]othe[place] Gata   Married   On Disability from [place]- (back pain, knee pain, ankle pain, neuropathy)   Completed associates degree   Former Retail banker, 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69   Enjoys reading, Media planner, volunteering   Social Drivers of Corporate investment banker Strain: Medium Risk (06/05/2024)   Overall Financial Resource Strain (CARDIA)    Difficulty of Paying Living Expenses: Somewhat hard  Food Insecurity: Food Insecurity Present (06/05/2024)   Hunger Vital Sign    Worried About Running Out of Food in the Last Year: Sometimes true    Ran Out of Food in the Last Year: Never true  Transportation Needs: No  Transportation Needs (06/05/2024)   PRAPARE - Administrator, Civil Service (Medical): No    Lack of Transportation (Non-Medical): No  Physical Activity: Insufficiently Active (06/05/2024)   Exercise Vital Sign    Days of Exercise per Week: 2 days    Minutes of Exercise per Session: 60 min  Stress: No Stress Concern Present (06/06/2024)   [employer] of Occupational Health - Occupational Stress Questionnaire    Feeling of Stress : Not at all  Social Connections: Moderately Integrated (06/05/2024)   Social Connection and Isolation Panel [NHANES]    Frequency of Communication with Friends and Family: More than three times a week    Frequency of Social Gatherings with Friends and Family: Once a week    Attends Religious Services: 1 to 4 times per year    Active Member of [employer] or Organizations: No    Attends Engineer, structural: Not on file    Marital Status: Married    Tobacco Counseling Ready to quit: Not Answered Counseling given: Not Answered    Clinical Intake:          BMI - recorded: 35.36 Nutritional Status: BMI > 30  Obese Diabetes: Yes CBG done?: No Did pt. bring in CBG monitor from home?: No  Lab Results  Component Value Date   HGBA1C 9.7 (H) 01/24/2024   HGBA1C 8.1 (H) 02/14/2019   HGBA1C 8.2 (H) 02/05/2017     How often do you need to have someone help you when you read instructions, pamphlets, or other written materials from your doctor or pharmacy?: 1 - Never What is the last grade level you completed in school?: associates degree  Interpreter Needed?: No  Information entered by :: Susa Engman, CMA  Activities of Daily Living     06/06/2024    3:03 PM 06/05/2024    4:44 PM  In your present state of health, do you have any difficulty performing the following activities:  Hearing? 1 1  Comment History of Retail banker and had progressive hearing loss from this. [facility].   Vision?  1 1  Comment 04/26/24 at [facility]   Difficulty concentrating or making decisions? 1 1  Walking or climbing stairs? 1 1  Comment ortho issues being treated   Dressing or bathing? 0 0  Doing errands, shopping? 0 0  Preparing Food and eating ? N N  Using the Toilet? N N  In the past six months, have you accidently leaked urine? Colie Dawes  Comment has intermittent urinary leaks if trying to hold urine.   Do you have problems with loss of bowel control? N N  Managing your Medications? N N  Managing your Finances? N N  Housekeeping or managing your Housekeeping? N N    Patient Care Team: Dorrene Gaucher, NP as PCP - General (Internal Medicine) Mulles, Corazon, MD as Referring Physician (Internal Medicine) Trudy Fusi, DO as Consulting Physician (Allergy)  I have updated your Care Teams any recent Medical Services you may have received from other providers in the past year.     Assessment:    This is a routine wellness examination for [place].  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   Goals Addressed               This Visit's Progress     Patient Stated (pt-stated)        He wants to get weight under 200 and maintain that weight.       Depression Screen     06/06/2024    3:21 PM 01/24/2024   10:03 AM 03/08/2018   11:10 AM 02/05/2017    4:13 PM  PHQ 2/9 Scores  PHQ - 2 Score 0 0 0 0  PHQ- 9 Score  0 1 9    Fall Risk     06/06/2024    3:25 PM 06/06/2024    3:24 PM 06/05/2024    4:44 PM 01/24/2024   10:03 AM  Fall Risk   Falls in the past year? 1 0 0 1  Number falls in past yr: 1  1 1   Injury with Fall? 0  1 1  Risk for fall due to : Impaired mobility;Other (Comment)   No Fall Risks  Risk for fall due to: Comment spondylolithesis     Follow up Education provided   Falls evaluation completed    MEDICARE RISK AT HOME:  Medicare Risk at Home Any  stairs in or around the home?: Yes If so, are there any without handrails?: No Home free of loose throw rugs in walkways, pet beds, electrical cords, etc?: No Adequate lighting in your home to reduce risk of falls?: Yes Life alert?: No Use of a cane, walker or w/c?: Yes Grab bars in the bathroom?: No Shower chair or bench in shower?: No Elevated toilet seat or a handicapped toilet?: Yes  TIMED UP AND GO:  Was the test performed?  Yes  Length of time to ambulate 10 feet: 6 sec Gait slow and steady without use of assistive device  Cognitive Function: 6CIT completed  06/06/2024    3:28 PM  6CIT Screen  What Year? 0 points  What month? 0 points  What time? 0 points  Count back from 20 0 points  Months in reverse 0 points  Repeat phrase 0 points  Total Score 0 points    Immunizations Immunization History  Administered Date(s) Administered   Influenza,inj,Quad PF,6+ Mos 12/10/2015, 08/28/2016, 09/08/2017, 09/27/2018, 12/23/2020   Influenza-Unspecified 12/28/2013   Moderna Sars-Covid-2 Vaccination 03/29/2020, 04/26/2020, 01/05/2021   PNEUMOCOCCAL CONJUGATE-20 05/31/2024   Pneumococcal Polysaccharide-23 06/08/2017   Pneumococcal-Unspecified 06/09/2006   Td 12/28/2008   Tdap 05/11/2023   Zoster Recombinant(Shingrix) 01/20/2021, 01/22/2021    Screening Tests Health Maintenance  Topic Date Due   OPHTHALMOLOGY EXAM  05/19/2019   Zoster Vaccines- Shingrix (2 of 2) 03/19/2021   FOOT EXAM  01/28/2022   COVID-19 Vaccine (4 - 2024-25 season) 08/29/2023   HEMOGLOBIN A1C  07/23/2024   INFLUENZA VACCINE  07/28/2024   Diabetic kidney evaluation - eGFR measurement  01/23/2025   Diabetic kidney evaluation - Urine ACR  01/23/2025   Medicare Annual Wellness (AWV)  06/06/2025   Colonoscopy  12/04/2025   DTaP/Tdap/Td (3 - Td or Tdap) 05/10/2033   Pneumococcal Vaccine 74-60 Years old  Completed   Hepatitis C Screening  Completed   HIV Screening  Completed   HPV VACCINES  Aged  Out   Meningococcal B Vaccine  Aged Out    Health Maintenance Completed shingrix and pneumovax. Completed foot exam June 2025 and eye exam 04/26/24 at [facility].  Health Maintenance Due  Topic Date Due   OPHTHALMOLOGY EXAM  05/19/2019   Zoster Vaccines- Shingrix (2 of 2) 03/19/2021   FOOT EXAM  01/28/2022   COVID-19 Vaccine (4 - 2024-25 season) 08/29/2023   Health Maintenance Items Addressed:    Additional Screening:  Vision Screening: Recommended annual ophthalmology exams for early detection of glaucoma and other disorders of the eye. Would you like a referral to an eye doctor? No    Dental Screening: Recommended annual dental exams for proper oral hygiene  Community Resource Referral / Chronic Care Management: CRR required this visit?  No   CCM required this visit?  No   Plan:    I have personally reviewed and noted the following in the patient's chart:   Medical and social history Use of alcohol, tobacco or illicit drugs  Current medications and supplements including opioid prescriptions. Patient is not currently taking opioid prescriptions. Functional ability and status Nutritional status Physical activity Advanced directives List of other physicians Hospitalizations, surgeries, and ER visits in previous 12 months Vitals Screenings to include cognitive, depression, and falls Referrals and appointments  In addition, I have reviewed and discussed with patient certain preventive protocols, quality metrics, and best practice recommendations. A written personalized care plan for preventive services as well as general preventive health recommendations were provided to patient.   Susa Engman, CMA   06/06/2024   After Visit Summary: (In Person-Printed) AVS printed and given to the patient  Notes: Please refer to Routing Comments.

## 2024-06-09 ENCOUNTER — Ambulatory Visit (INDEPENDENT_AMBULATORY_CARE_PROVIDER_SITE_OTHER): Admitting: *Deleted

## 2024-06-09 DIAGNOSIS — J309 Allergic rhinitis, unspecified: Secondary | ICD-10-CM

## 2024-06-19 NOTE — Assessment & Plan Note (Signed)
 Lab Results  Component Value Date   HGBA1C 9.7 (H) 01/24/2024   HGBA1C 8.1 (H) 02/14/2019   HGBA1C 8.2 (H) 02/05/2017   Lab Results  Component Value Date   MICROALBUR 53.8 (H) 01/24/2024   LDLCALC 79 01/24/2024   CREATININE 1.17 01/24/2024   Following with endocrinology at the [facility].  Has an appointment coming up.

## 2024-06-20 ENCOUNTER — Telehealth: Payer: Self-pay | Admitting: Family

## 2024-06-20 ENCOUNTER — Ambulatory Visit (INDEPENDENT_AMBULATORY_CARE_PROVIDER_SITE_OTHER): Admitting: Family

## 2024-06-20 VITALS — BP 120/79 | HR 83 | Temp 98.4°F | Resp 16 | Ht 64.0 in | Wt 211.0 lb

## 2024-06-20 DIAGNOSIS — G4733 Obstructive sleep apnea (adult) (pediatric): Secondary | ICD-10-CM

## 2024-06-20 DIAGNOSIS — K219 Gastro-esophageal reflux disease without esophagitis: Secondary | ICD-10-CM

## 2024-06-20 DIAGNOSIS — G6289 Other specified polyneuropathies: Secondary | ICD-10-CM

## 2024-06-20 DIAGNOSIS — E039 Hypothyroidism, unspecified: Secondary | ICD-10-CM | POA: Diagnosis not present

## 2024-06-20 DIAGNOSIS — G2581 Restless legs syndrome: Secondary | ICD-10-CM

## 2024-06-20 DIAGNOSIS — E119 Type 2 diabetes mellitus without complications: Secondary | ICD-10-CM

## 2024-06-20 DIAGNOSIS — Z905 Acquired absence of kidney: Secondary | ICD-10-CM

## 2024-06-20 DIAGNOSIS — E7800 Pure hypercholesterolemia, unspecified: Secondary | ICD-10-CM

## 2024-06-20 DIAGNOSIS — H1010 Acute atopic conjunctivitis, unspecified eye: Secondary | ICD-10-CM

## 2024-06-20 DIAGNOSIS — J3089 Other allergic rhinitis: Secondary | ICD-10-CM

## 2024-06-20 DIAGNOSIS — M8580 Other specified disorders of bone density and structure, unspecified site: Secondary | ICD-10-CM | POA: Insufficient documentation

## 2024-06-20 DIAGNOSIS — J4540 Moderate persistent asthma, uncomplicated: Secondary | ICD-10-CM | POA: Diagnosis not present

## 2024-06-20 DIAGNOSIS — E1149 Type 2 diabetes mellitus with other diabetic neurological complication: Secondary | ICD-10-CM

## 2024-06-20 DIAGNOSIS — I10 Essential (primary) hypertension: Secondary | ICD-10-CM

## 2024-06-20 DIAGNOSIS — J302 Other seasonal allergic rhinitis: Secondary | ICD-10-CM | POA: Diagnosis not present

## 2024-06-20 DIAGNOSIS — N529 Male erectile dysfunction, unspecified: Secondary | ICD-10-CM

## 2024-06-20 DIAGNOSIS — E291 Testicular hypofunction: Secondary | ICD-10-CM | POA: Diagnosis not present

## 2024-06-20 DIAGNOSIS — N400 Enlarged prostate without lower urinary tract symptoms: Secondary | ICD-10-CM

## 2024-06-20 LAB — VITAMIN D 25 HYDROXY (VIT D DEFICIENCY, FRACTURES): VITD: 38.31 ng/mL (ref 30.00–100.00)

## 2024-06-20 LAB — HEMOGLOBIN A1C: Hgb A1c MFr Bld: 7.3 % — ABNORMAL HIGH (ref 4.6–6.5)

## 2024-06-20 LAB — TSH: TSH: 1.33 u[IU]/mL (ref 0.35–5.50)

## 2024-06-20 MED ORDER — CALTRATE 600+D PLUS MINERALS 600-800 MG-UNIT PO TABS
ORAL_TABLET | ORAL | Status: AC
Start: 2024-06-20 — End: ?

## 2024-06-20 MED ORDER — OMEPRAZOLE 20 MG PO CPDR: 40.0000 mg | DELAYED_RELEASE_CAPSULE | Freq: Every day | ORAL | Status: AC

## 2024-06-20 MED ORDER — GABAPENTIN 300 MG PO CAPS: 600.0000 mg | ORAL_CAPSULE | Freq: Two times a day (BID) | ORAL | Status: AC

## 2024-06-20 NOTE — Telephone Encounter (Signed)
 Please request vision exam from the [place].

## 2024-06-20 NOTE — Telephone Encounter (Signed)
 Electronic request sent to [facility] clinic in [place]

## 2024-06-20 NOTE — Assessment & Plan Note (Signed)
 On testosterone therapy.  This is being managed by the [place].

## 2024-06-20 NOTE — Assessment & Plan Note (Signed)
 New finding. Check PTH, vit D and add calcium 600mg  bid.

## 2024-06-20 NOTE — Assessment & Plan Note (Signed)
Continue to avoid nsaids

## 2024-06-20 NOTE — Assessment & Plan Note (Signed)
 Uncontrolled on prilosec 20 mg.  GI recently increased to 40 mg.

## 2024-06-20 NOTE — Assessment & Plan Note (Signed)
 Lab Results  Component Value Date   PSA 0.52 01/24/2024   PSA 0.33 04/05/2018   On flomax , seeing Urology at the [facility].

## 2024-06-20 NOTE — Assessment & Plan Note (Addendum)
 Asthma is stable on advair, singulair .

## 2024-06-20 NOTE — Assessment & Plan Note (Signed)
 Improving with allergy shots.  Continues allegra  singulair  and zyrtec- per his allergist.

## 2024-06-20 NOTE — Assessment & Plan Note (Signed)
 Stable on mirapex. Continue same.

## 2024-06-20 NOTE — Assessment & Plan Note (Signed)
 On cialis:

## 2024-06-20 NOTE — Assessment & Plan Note (Signed)
 Wearing bipap.

## 2024-06-20 NOTE — Progress Notes (Signed)
 Subjective:     Patient ID: [name], male    DOB: 01-09-1961, 63 y.o.   MRN: [id number]  Chief Complaint  Patient presents with   Diabetes    Here for follow up   Hypothyroidism    Here for follow up    HPI  Discussed the use of AI scribe software for clinical note transcription with the patient, who gave verbal consent to proceed.  History of Present Illness [name] is a 63 year old male who presents with hand pain and follow-up on bone health.  Hand pain and orthopedic hardware complications - Hand pain after attempting to open a jar - Fractured screws in the hand from prior surgery in 2003 - Orthopedic evaluation scheduled at [facility]  Bone health and osteopenia - Recent bone scan and DXA show osteopenia - Recalls previous diagnosis of osteoporosis - Preparing for knee surgery on July 29th at [facility]  Allergic rhinitis and asthma - Allergies managed with allergy shots, Allegra , Singulair , and Zyrtec - Asthma controlled with Advair - Asthma flares in spring and winter  Gastrointestinal symptoms and gastroparesis - Omeprazole used for heartburn - Metoclopramide used for nausea - History of gastroparesis - Recent nausea and vomiting without diarrhea  Restless leg syndrome - Restless leg syndrome managed with Namirpex  Sleep apnea - Uses BiPAP machine for sleep apnea  Peripheral neuropathy - Gabapentin used for neuropathy pain in the left foot  Testosterone deficiency - On testosterone therapy  Diabetes mellitus - Diabetes managed with Novolog 70/30 - Improved blood glucose levels  Lower urinary tract symptoms - Flomax  used for urinary symptoms  Hypertension - Losartan used for blood pressure control  Mood and psychosocial stressors - Mood is stable - Experiences stress related to family dynamics      Health Maintenance Due  Topic Date Due   OPHTHALMOLOGY EXAM  05/19/2019   COVID-19 Vaccine (4 -  2024-25 season) 08/29/2023    Past Medical History:  Diagnosis Date   Arthritis    Asthma    Cancer of kidney (HCC)    s/p L nephrectomy (renal cell carcinoma)   Carpal tunnel syndrome    Compression fracture of lumbar vertebra (HCC)    Depression    Deviated septum    Diabetes (HCC)    Fatty liver 03/08/2017   Fibromyalgia    Hepatitis A 1980   History of kidney cancer    Removed left kidney    Hypercholesteremia    Hypertension    Kidney disease    Migraine    Morbid obesity (HCC) 01/06/2017   Obesity    S/P revision of total knee, 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   Sinus congestion    Sleep apnea    Thyroid  disease     Past Surgical History:  Procedure Laterality Date   ANKLE SURGERY     BACK SURGERY  2007   COLONOSCOPY WITH ESOPHAGOGASTRODUODENOSCOPY (EGD)  [facility]   HAND SURGERY Right    KNEE SURGERY Left    X2   NASAL SEPTUM SURGERY  02/22/2019   turbinate reduction   NEPHRECTOMY Left 2003   ROTATOR CUFF REPAIR Right     Family History  Problem Relation Age of Onset   Glaucoma Maternal Grandmother    Leukemia Maternal Grandfather    Heart attack Brother    Diabetes Brother    Allergies Brother    Diabetes Brother    Allergies  Sister    Asthma Sister    Heart disease Mother    Rheumatologic disease Mother    Lupus Mother    Angioedema Mother    Irritable bowel syndrome Mother    Diverticulosis Mother    Other Mother 64       Colon Resecction    Heart disease Father    Diabetes Father    Liver disease Father    Rheumatologic disease Sister    Cancer Paternal Grandfather        leukemia   Asthma Child    Allergic rhinitis Child    Esophageal cancer Maternal Aunt    Eczema Neg Hx    Urticaria Neg Hx     Social History   Socioeconomic History   Marital status: Married    Spouse name: Not on file   Number of children: 1   Years of education: Not on file   Highest education level: Associate degree:  occupational, Scientist, product/process development, or vocational program  Occupational History   Occupation: Retired  Retail banker   Tobacco Use   Smoking status: Some Days    Types: Cigars, Cigarettes    Start date: 1979    Passive exposure: Current   Smokeless tobacco: Never  Vaping Use   Vaping status: Never Used  Substance and Sexual Activity   Alcohol use: Yes    Alcohol/week: 0.0 standard drinks of alcohol    Comment: occasionally   Drug use: Never   Sexual activity: Yes  Other Topics Concern   Not on file  Social History Narrative   1 son born 2004 [place]   Married   On Disability from [place]- (back pain, knee pain, ankle pain, neuropathy)   Completed associates degree   Former Retail banker, 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87   Enjoys reading, Media planner, volunteering   Social Drivers of Corporate investment banker Strain: Medium Risk (06/05/2024)   Overall Financial Resource Strain (CARDIA)    Difficulty of Paying Living Expenses: Somewhat hard  Food Insecurity: Food Insecurity Present (06/05/2024)   Hunger Vital Sign    Worried About Running Out of Food in the Last Year: Sometimes true    Ran Out of Food in the Last Year: Never true  Transportation Needs: No Transportation Needs (06/05/2024)   PRAPARE - Administrator, Civil Service (Medical): No    Lack of Transportation (Non-Medical): No  Physical Activity: Insufficiently Active (06/05/2024)   Exercise Vital Sign    Days of Exercise per Week: 2 days    Minutes of Exercise per Session: 60 min  Stress: No Stress Concern Present (06/06/2024)   [employer] of Occupational Health - Occupational Stress Questionnaire    Feeling of Stress : Not at all  Social Connections: Moderately Integrated (06/05/2024)   Social Connection and Isolation Panel    Frequency of Communication with Friends and Family: More than three times a week    Frequency of Social Gatherings with Friends and Family: Once a week    Attends  Religious Services: 1 to 4 times per year    Active Member of [employer] or Organizations: No    Attends Banker Meetings: Not on file    Marital Status: Married  Catering manager Violence: Not At Risk (06/06/2024)   Humiliation, Afraid, Rape, and Kick questionnaire    Fear of Current or Ex-Partner: No    Emotionally Abused: No    Physically Abused: No  Sexually Abused: No    Outpatient Medications Prior to Visit  Medication Sig Dispense Refill   albuterol  (VENTOLIN  HFA) 108 (90 Base) MCG/ACT inhaler Inhale 2 puffs into the lungs every 6 (six) hours as needed for wheezing or shortness of breath. 8 g 2   atorvastatin (LIPITOR) 80 MG tablet Take 1 tablet by mouth daily.     cetirizine (ZYRTEC) 10 MG tablet Take 1 tablet by mouth at bedtime.     Cholecalciferol 50 MCG (2000 UT) TABS Take 2 tablets by mouth daily.     citalopram (CELEXA) 40 MG tablet Take 40 mg by mouth daily.     empagliflozin (JARDIANCE) 25 MG TABS tablet Take 25 mg by mouth daily.     EPINEPHrine  0.3 mg/0.3 mL IJ SOAJ injection Inject into the muscle.     fexofenadine  (ALLEGRA ) 180 MG tablet Take 1 tablet by mouth daily.     fluticasone  (FLONASE) 50 MCG/ACT nasal spray Administer 1 spray in each nostril daily as needed.     Fluticasone -Salmeterol (ADVAIR DISKUS) 250-50 MCG/DOSE AEPB Inhale 1 puff into the lungs 2 (two) times daily.  3   Glucagon 0.5 MG/0.1ML SOAJ Inject into the skin.     hydrochlorothiazide (HYDRODIURIL) 25 MG tablet Take 1 tablet by mouth daily.     hydrOXYzine (ATARAX) 10 MG tablet Take by mouth.     Insulin  Glargine (BASAGLAR  KWIKPEN) 100 UNIT/ML Inject 10 Units into the skin daily. 3 mL 2   Insulin  Pen Needle 31G X 5 MM MISC Use as directed. 100 each 4   ketotifen (ZADITOR) 0.025 % ophthalmic solution Place 1 drop into both eyes 2 (two)  times daily as needed.     Lactobacillus (ACIDOPHILUS) CAPS capsule Take 2 capsules by mouth daily.     levothyroxine  (SYNTHROID ) 75 MCG tablet Take 1  tablet (75 mcg total) by mouth daily. 30 tablet 3   Lidocaine HCl 4 % LIQD Apply 1 application topically daily as needed.     losartan (COZAAR) 100 MG tablet Take 1 tablet by mouth daily.     metFORMIN (GLUCOPHAGE-XR) 500 MG 24 hr tablet Take 1,000 mg by mouth 2 (two) times daily.      montelukast  (SINGULAIR ) 10 MG tablet Take 1 tablet by mouth at bedtime.     NON FORMULARY Allergen immunotherapy     olopatadine  (PATANOL) 0.1 % ophthalmic solution Apply to eye.     Omega-3 1000 MG CAPS Take by mouth.     ondansetron  (ZOFRAN -ODT) 4 MG disintegrating tablet DISSOLVE ONE TABLET MOUTH THREE TIMES A DAY AS NEEDED AS DIRECTED BY PROVIDER     pioglitazone (ACTOS) 30 MG tablet Take by mouth.     pramipexole (MIRAPEX) 0.5 MG tablet Take 0.5 mg by mouth 3 (three) times daily.     Simethicone LIQD      sodium chloride  (OCEAN) 0.65 % nasal spray      tadalafil (CIALIS) 20 MG tablet Take by mouth.     tamsulosin  (FLOMAX ) 0.4 MG CAPS capsule TAKE 1 CAPSULE (0.4 MG TOTAL) BY MOUTH DAILY. 30 capsule 5   testosterone cypionate (DEPOTESTOSTERONE CYPIONATE) 200 MG/ML injection Inject 200 mg into the muscle every 14 (fourteen) days.     urea (CARMOL) 10 % cream Apply topically as needed.     gabapentin (NEURONTIN) 600 MG tablet Take by mouth.     omeprazole (PRILOSEC) 20 MG capsule Take by mouth.     Semaglutide,0.25 or 0.5MG /DOS, 2 MG/1.5ML SOPN Inject 0.5 mg into the  skin once a week.     No facility-administered medications prior to visit.    Allergies  Allergen Reactions   Hydrocodone Itching   Niacin And Related Hives    ROS    See HPI Objective:    Physical Exam Constitutional:      General: He is not in acute distress.    Appearance: He is well-developed.  HENT:     Head: Normocephalic and atraumatic.   Cardiovascular:     Rate and Rhythm: Normal rate and regular rhythm.     Heart sounds: No murmur heard. Pulmonary:     Effort: Pulmonary effort is normal. No respiratory distress.      Breath sounds: Normal breath sounds. No wheezing or rales.   Skin:    General: Skin is warm and dry.   Neurological:     Mental Status: He is alert and oriented to person, place, and time.   Psychiatric:        Behavior: Behavior normal.        Thought Content: Thought content normal.      BP 120/79 (BP Location: Right Arm, Patient Position: Sitting, Cuff Size: Normal)   Pulse 83   Temp 98.4 F (36.9 C) (Oral)   Resp 16   Ht 5' 4 (1.626 m)   Wt 211 lb (95.7 kg)   SpO2 97%   BMI 36.22 kg/m  Wt Readings from Last 3 Encounters:  06/20/24 211 lb (95.7 kg)  06/06/24 206 lb (93.4 kg)  02/09/24 208 lb 12.8 oz (94.7 kg)       Assessment & Plan:   Problem List Items Addressed This Visit       Unprioritized   Type 2 diabetes mellitus with neurological complications (HCC) - Primary   Lab Results  Component Value Date   HGBA1C 9.7 (H) 01/24/2024   HGBA1C 8.1 (H) 02/14/2019   HGBA1C 8.2 (H) 02/05/2017   Lab Results  Component Value Date   MICROALBUR 53.8 (H) 01/24/2024   LDLCALC 79 01/24/2024   CREATININE 1.17 01/24/2024   Following with endocrinology at the [facility].  Has an appointment coming up.       Solitary kidney, acquired   Continue to avoid nsaids.       Seasonal and perennial allergic rhinoconjunctivitis   Improving with allergy shots.  Continues allegra  singulair  and zyrtec- per his allergist.      RLS (restless legs syndrome)   Stable on mirapex. Continue same.      Osteopenia   New finding. Check PTH, vit D and add calcium 600mg  bid.      Relevant Medications   Calcium Carbonate-Vit D-Min (CALTRATE 600+D PLUS MINERALS) 600-800 MG-UNIT TABS   Other Relevant Orders   Vitamin D (25 hydroxy)   PTH, intact (no Ca)   Obstructive sleep apnea with use of bilevel positive airway pressure (BPAP)   Wearing bipap.        Moderate persistent asthma without complication   Asthma is stable on advair, singulair .      Hypothyroidism   Lab Results   Component Value Date   TSH 2.52 09/06/2019   [facility]  [facility]      Relevant Orders   TSH   Hypogonadism in male   On testosterone therapy.  This is being managed by the [place].       Hypercholesterolemia   Lab Results  Component Value Date   CHOL 148 01/24/2024   HDL 41.00 01/24/2024   LDLCALC  79 01/24/2024   TRIG 141.0 01/24/2024   CHOLHDL 4 01/24/2024   Stable on lipitor, continue same.       Gastroesophageal reflux disease without esophagitis   Uncontrolled on prilosec 20 mg.  GI recently increased to 40 mg.       Relevant Medications   omeprazole (PRILOSEC) 20 MG capsule   Essential hypertension   BP Readings from Last 3 Encounters:  06/20/24 120/79  06/06/24 (!) 140/90  02/09/24 126/86   BP at goal. Continue losartan.       Erectile dysfunction   On cialis.       BPH (benign prostatic hyperplasia)   Lab Results  Component Value Date   PSA 0.52 01/24/2024   PSA 0.33 04/05/2018   On flomax , seeing Urology at the [facility].        Other Visit Diagnoses       Other polyneuropathy       Relevant Medications   gabapentin (NEURONTIN) 300 MG capsule       I have discontinued Torrin J. Purington Nando's Semaglutide(0.25 or 0.5MG /DOS) and gabapentin. I have also changed his omeprazole. Additionally, I am having him start on Caltrate 600+D Plus Minerals and gabapentin. Lastly, I am having him maintain his citalopram, metFORMIN, Lidocaine HCl, urea, empagliflozin, testosterone cypionate, tamsulosin , levothyroxine , NON FORMULARY, pramipexole, Fluticasone -Salmeterol, albuterol , atorvastatin, cetirizine, Cholecalciferol, EPINEPHrine , fluticasone , Glucagon, ketotifen, Acidophilus, losartan, fexofenadine , montelukast , Omega-3, ondansetron , sodium chloride , tadalafil, Simethicone, pioglitazone, hydrochlorothiazide, olopatadine , hydrOXYzine, Basaglar  KwikPen, and Insulin  Pen Needle.  Meds ordered this encounter  Medications   Calcium Carbonate-Vit  D-Min (CALTRATE 600+D PLUS MINERALS) 600-800 MG-UNIT TABS    Sig: One tab twice daily    Supervising Provider:   DOMENICA BLACKBIRD A [[id number]]   omeprazole (PRILOSEC) 20 MG capsule    Sig: Take 2 capsules (40 mg total) by mouth daily.    Supervising Provider:   DOMENICA BLACKBIRD A [[id number]]   gabapentin (NEURONTIN) 300 MG capsule    Sig: Take 2 capsules (600 mg total) by mouth 2 (two) times daily.    Supervising Provider:   DOMENICA BLACKBIRD A [id number]

## 2024-06-20 NOTE — Assessment & Plan Note (Signed)
 Lab Results  Component Value Date   CHOL 148 01/24/2024   HDL 41.00 01/24/2024   LDLCALC 79 01/24/2024   TRIG 141.0 01/24/2024   CHOLHDL 4 01/24/2024   Stable on lipitor, continue same.

## 2024-06-20 NOTE — Assessment & Plan Note (Signed)
 BP Readings from Last 3 Encounters:  06/20/24 120/79  06/06/24 (!) 140/90  02/09/24 126/86   BP at goal. Continue losartan.

## 2024-06-20 NOTE — Assessment & Plan Note (Addendum)
 Lab Results  Component Value Date   TSH 2.52 09/06/2019   [facility].  [facility] .

## 2024-06-21 ENCOUNTER — Ambulatory Visit: Payer: Self-pay | Admitting: Family

## 2024-06-21 LAB — PARATHYROID HORMONE, INTACT (NO CA): PTH: 45 pg/mL (ref 16–77)

## 2024-06-22 ENCOUNTER — Ambulatory Visit: Payer: Self-pay

## 2024-07-07 ENCOUNTER — Ambulatory Visit (INDEPENDENT_AMBULATORY_CARE_PROVIDER_SITE_OTHER)

## 2024-07-07 DIAGNOSIS — J309 Allergic rhinitis, unspecified: Secondary | ICD-10-CM

## 2024-07-26 NOTE — Discharge Summary (Signed)
 Orthopedic Surgery Discharge Summary  Patient ID: [name] [id number] 63 y.o. 09-24-1961  Admit date: 07/25/2024 Admitting Physician: Bryan Vinie Haus, MD Admission Diagnoses:  Painful total knee replacement, subsequent encounter [T84.84XD, Z96.659] Status post revision of total knee, left [Z96.652] S/P total knee arthroplasty, left [Z96.652]     Discharge date: 07/26/2024   Discharge Physician: Bryan Vinie [facility]* Discharge Diagnoses:  Principal Problem:   Painful total knee replacement, subsequent encounter Active Problems:   Low testosterone   Benign prostatic hyperplasia   OSA (obstructive sleep apnea)   Status post revision of total knee, left   End-stage renal disease (ESRD)    (CMD)   S/P total knee arthroplasty, left Resolved Problems:   * No resolved hospital problems. *  Operative Procedures:  Revision left total knee arthroplasty (synovectomy/liner exchange);  [id number]   Indications for Operative Procedures: per operative report: [name] is a 63 y.o. year old male with a history of knee pain. Having failed conservative management, the patient elected to proceed with a revision total knee arthroplasty.  We have reviewed the risk and benefits of the surgery and they elected to proceed after voicing understanding. [name] is aware that the risk include but are not limited to bleeding, damage to surrounding structures including nerves and vessels, blood clots, pulmonary embolism, hardware failure, leg length inequality, fracture, stiffness, stroke, heart attack, death and the failure to meet desired results.   Hospital Course:  63 y.o. male presented to the OR on the above named dates for the above named procedures.   [name]  was able to tolerate the procedure without difficulty and was transferred to the Orthopedic surgery service for postoperative management, pain control, therapy, and discharge  planning.    The patient's pain was well controlled with the assistance of the Acute Pain Service.  On the day of discharge, the patient is verbalizing adequate relief with the use of the prescribed pain regimen.  Therapy was initiated and the patient was able to meet all discharge therapy goals for a safe discharge home.  [name] is weight bearing as tolerated to his LEFT LOWER EXTREMITY. The patient has been able to void without difficulty.  The patient has been able to tolerate their diet without complaints of nausea, vomiting, or diarrhea.  The patient is with an AQUACEL dressing to their LEFT LOWER extremity that is clean, dry, and intact.  This AQUACEL dressing is to remain intact until Friday, August 8th, 2025. DVT prophylaxis initiated with ASA 81 mg twice per day.   At the time of discharge, the patient was afebrile, vital signs were stable, in no acute distress.  The patient meets all goals necessary for discharge from a medical, surgical, and therapy standpoint, and thus the patient is stable and safe for discharge.  New discharge medications discussed in detail and the patient stated understanding of use and administration.  The patient is verbalizing understanding of all discharge instructions and therefore was released.     Surgical operations performed during hospitalization: Procedure(s) (LRB): ARTHROPLASTY KNEE TOTAL REVISION (Left) SYNOVECTOMY KNEE (Left)   Consults: General Internal Medicine, Anesthesia Acute Pain Team   Disposition:  HOME   Patient Instructions:    Home Medications After Discharge  Scheduled . acetaminophen  (TYLENOL ) 500 mg tablet, Take 1,000 mg by mouth every 8 (eight) hours. SABRA aspirin 81 mg EC tablet, Take 1 tablet (81 mg total) by mouth 2 (two) times a day for 6 weeks, END DATE:  09/06/2024. RESUME once daily regimen on 09/07/2024 . atorvastatin (LIPITOR) 80 mg tablet, Take 80 mg by mouth daily. . cephALEXin (KEFLEX) 500 mg capsule, Take 1  capsule (500 mg total) by mouth 2 (two) times a day for 7 days. . cetirizine (ZyrTEC) 10 mg tablet, Take 10 mg by mouth nightly. . cholecalciferol (VITAMIN D3) 2,000 unit tablet, Take 2,000 Units by mouth daily. . citalopram (CeleXA) 40 mg tablet, Take 40 mg by mouth daily. . cyanocobalamin (VITAMIN B12) 1,000 mcg tablet, Take 1,000 mcg by mouth daily. SABRA docusate sodium (COLACE) 100 mg capsule, Take 1 capsule (100 mg total) by mouth 2 (two) times a day for 10 days. While taking narcotic pain medication for the prevention of constipation. HOLD for diarrhea. . empagliflozin (Jardiance) 25 mg tab, Take 25 mg by mouth Once Daily. . fexofenadine  (ALLEGRA ) 180 mg tablet, Take 180 mg by mouth daily. . fluticasone  propion-salmeteroL (ADVAIR DISKUS) 250-50 mcg/dose diskus inhaler, Inhale 1 puff 2 (two) times a day. . gabapentin  (NEURONTIN ) 600 mg tablet, Take 1,200 mg by mouth 2 (two) times a day. . hydroCHLOROthiazide (HYDRODIURIL) 25 mg tablet, Take 25 mg by mouth daily. . levothyroxine  (SYNTHROID ) 50 mcg tablet, Take 50 mcg by mouth daily. SABRA losartan (COZAAR) 100 mg tablet, Take 100 mg by mouth daily. . magnesium oxide 400 mg (241 mg magnesium) tab, Take 400 mg by mouth daily. . metFORMIN (GLUCOPHAGE-XR) 500 mg 24 hr tablet, Take 1,000 mg by mouth 2 (two) times a day. . montelukast  (SINGULAIR ) 10 mg tablet, Take 1 tablet by mouth nightly. . multivitamin (THERAGRAN) tab tablet, Take 1 tablet by mouth daily. . omeprazole  (PriLOSEC) 40 mg DR capsule, Take 40 mg by mouth daily. . pramipexole (MIRAPEX) 0.5 mg tablet, Take 0.5 mg by mouth nightly. . semaglutide (Ozempic) 2 mg/dose (8 mg/3 mL) subcutaneous pen injector, Inject 2 mg under the skin every 7 days. . tamsulosin  (FLOMAX ) 0.4 mg cap, Take 0.4 mg by mouth at bedtime. SABRA testosterone cypionate (DEPO-TESTOSTERONE) 200 mg/mL injection, Inject 200 mg into the muscle every 14 (fourteen) days.  PRN . albuterol  HFA (PROVENTIL  HFA;VENTOLIN  HFA;PROAIR  HFA)  90 mcg/actuation inhaler, Inhale 2 puffs every 6 (six) hours as needed for wheezing. . EPINEPHrine  (EPIPEN ) 0.3 mg/0.3 mL injection syringe, Inject 0.3 mg into the thigh once as needed for anaphylaxis. SABRA glucagon (Gvoke HypoPen 2-Pack) 0.5 mg/0.1 mL atIn, Inject 0.3 mg under the skin as needed. . lanolin alcohol-mo-w.pet-ceres (EUCERIN) cream, Apply topically as needed for dry skin. . methocarbamoL (ROBAXIN) 500 mg tablet, Take 1 tablet (500 mg total) by mouth 4 (four) times a day as needed for muscle spasms. . metoclopramide (REGLAN) 5 mg tablet, Take 2.5 mg by mouth nightly as needed (nausea). . naloxone (Narcan) 4 mg/actuation spry nasal spray, Administer 1 spray into affected nostril(s) as needed for opioid reversal. Spray the contents of 1 device into 1 nostril. Call 911. May repeat with 2nd device in alternate nostril if no response in 2-3 minutes. . olopatadine  (PATANOL) 0.1 % ophthalmic solution, Administer 1 drop into both eyes daily as needed for allergies. . oxyCODONE  (ROXICODONE ) 5 mg immediate release tablet, Take 1-2 tablets (5-10 mg total) by mouth every 4 (four) hours as needed for moderate pain (4-6) or severe pain (7-10) (1 tab: 5 mg: mod pain, 2 tabs: 10 mg: sev pain). . propylene glycoL (SYSTANE BALANCE) 0.6 % drop ophthalmic solution, Administer 1 drop into both eyes 4 (four) times a day as needed for dry eyes.  No Frequency . clotrimazole (LOTRIMIN)  1 % soln, Apply small amount to affected area twice a day for fungal toenails. . terbinafine (LamISIL) 1 % cream, Apply small amount to affected area twice a day for tinea pedis.         Medication List     START taking these medications    cephALEXin 500 mg capsule Commonly known as: KEFLEX Take 1 capsule (500 mg total) by mouth 2 (two) times a day for 7 days.   docusate sodium 100 mg capsule Commonly known as: COLACE Take 1 capsule (100 mg total) by mouth 2 (two) times a day for 10 days. While taking narcotic pain  medication for the prevention of constipation. HOLD for diarrhea.   methocarbamoL 500 mg tablet Commonly known as: ROBAXIN Take 1 tablet (500 mg total) by mouth 4 (four) times a day as needed for muscle spasms.   naloxone 4 mg/actuation Spry nasal spray Commonly known as: Narcan Administer 1 spray into affected nostril(s) as needed for opioid reversal. Spray the contents of 1 device into 1 nostril. Call 911. May repeat with 2nd device in alternate nostril if no response in 2-3 minutes.   oxyCODONE  5 mg immediate release tablet Commonly known as: ROXICODONE  Take 1-2 tablets (5-10 mg total) by mouth every 4 (four) hours as needed for moderate pain (4-6) or severe pain (7-10) (1 tab: 5 mg: mod pain, 2 tabs: 10 mg: sev pain).       CHANGE how you take these medications    aspirin 81 mg EC tablet Take 1 tablet (81 mg total) by mouth 2 (two) times a day for 6 weeks, END DATE: 09/06/2024. RESUME once daily regimen on 09/07/2024 What changed: when to take this       CONTINUE taking these medications    albuterol  HFA 90 mcg/actuation inhaler Commonly known as: PROVENTIL  HFA;VENTOLIN  HFA;PROAIR  HFA Inhale 2 puffs every 6 (six) hours as needed for wheezing.   atorvastatin 80 mg tablet Commonly known as: LIPITOR Take 80 mg by mouth daily.   cetirizine 10 mg tablet Commonly known as: ZyrTEC Take 10 mg by mouth nightly.   cholecalciferol 2,000 unit tablet Commonly known as: VITAMIN D3 Take 2,000 Units by mouth daily.   citalopram 40 mg tablet Commonly known as: CeleXA Take 40 mg by mouth daily.   clotrimazole 1 % Soln Commonly known as: LOTRIMIN Apply small amount to affected area twice a day for fungal toenails.   cyanocobalamin 1,000 mcg tablet Commonly known as: VITAMIN B12 Take 1,000 mcg by mouth daily.   EPINEPHrine  0.3 mg/0.3 mL injection syringe Commonly known as: EPIPEN  Inject 0.3 mg into the thigh once as needed for anaphylaxis.   fexofenadine  180 mg  tablet Commonly known as: ALLEGRA  Take 180 mg by mouth daily.   fluticasone  propion-salmeteroL 250-50 mcg/dose diskus inhaler Commonly known as: ADVAIR DISKUS Inhale 1 puff 2 (two) times a day.   gabapentin  600 mg tablet Commonly known as: NEURONTIN  Take 1,200 mg by mouth 2 (two) times a day.   Gvoke HypoPen 2-Pack 0.5 mg/0.1 mL Atin Generic drug: glucagon Inject 0.3 mg under the skin as needed.   hydroCHLOROthiazide 25 mg tablet Commonly known as: HYDRODIURIL Take 25 mg by mouth daily.   Jardiance 25 mg Tab Generic drug: empagliflozin Take 25 mg by mouth Once Daily.   lanolin alcohol-mo-w.pet-ceres cream Commonly known as: EUCERIN Apply topically as needed for dry skin.   levothyroxine  50 mcg tablet Commonly known as: SYNTHROID  Take 50 mcg by mouth daily.   losartan  100 mg tablet Commonly known as: COZAAR Take 100 mg by mouth daily.   magnesium oxide 400 mg (241 mg magnesium) Tab Take 400 mg by mouth daily.   metFORMIN 500 mg 24 hr tablet Commonly known as: GLUCOPHAGE-XR Take 1,000 mg by mouth 2 (two) times a day.   metoclopramide 5 mg tablet Commonly known as: REGLAN Take 2.5 mg by mouth nightly as needed (nausea).   montelukast  10 mg tablet Commonly known as: SINGULAIR  Take 1 tablet by mouth nightly.   multivitamin Tab tablet Commonly known as: THERAGRAN Take 1 tablet by mouth daily.   olopatadine  0.1 % ophthalmic solution Commonly known as: PATANOL Administer 1 drop into both eyes daily as needed for allergies.   omeprazole  40 mg DR capsule Commonly known as: PriLOSEC Take 40 mg by mouth daily.   Ozempic 2 mg/dose (8 mg/3 mL) subcutaneous pen injector Generic drug: semaglutide Inject 2 mg under the skin every 7 days.   pramipexole 0.5 mg tablet Commonly known as: MIRAPEX Take 0.5 mg by mouth nightly.   propylene glycoL 0.6 % Drop ophthalmic solution Commonly known as: SYSTANE BALANCE Administer 1 drop into both eyes 4 (four) times a day as  needed for dry eyes.   tamsulosin  0.4 mg Cap Commonly known as: FLOMAX  Take 0.4 mg by mouth at bedtime.   terbinafine 1 % cream Commonly known as: LamISIL Apply small amount to affected area twice a day for tinea pedis.   testosterone cypionate 200 mg/mL injection Commonly known as: DEPO-TESTOSTERONE Inject 200 mg into the muscle every 14 (fourteen) days.       STOP taking these medications    sennosides-docusate sodium 8.6-50 mg per tablet Commonly known as: PERICOLACE       ASK your doctor about these medications    acetaminophen  500 mg tablet Commonly known as: TYLENOL  Take 1,000 mg by mouth every 8 (eight) hours.         Where to Get Your Medications     These medications were sent to [facility] [place] [id number]    Hours: Open Monday 12am to Friday 11:59pm; Sat-Sun: Closed; Holidays: Closed Thanksgiving Phone: [phone]  aspirin 81 mg EC tablet cephALEXin 500 mg capsule docusate sodium 100 mg capsule methocarbamoL 500 mg tablet naloxone 4 mg/actuation Spry nasal spray oxyCODONE  5 mg immediate release tablet      Activity: activity as tolerated and no heavy lifting, pushing, pulling with the implant side for 2 months Diet: diabetic diet Wound Care: keep current aquacel dressing clean, dry, and intact. This dressing may be removed on Friday, August 8th, 2025.  Weight Bearing Status: weight bearing as tolerated  DVT prophylaxis: ASA 81 mg twice per day x 6 weeks, END DATE: 09/06/2024  The   Controlled Substance Reporting System was reviewed for Erla Baller Byrom prior to providing Erla Baller Riel with discharge narcotic prescription.  Please contact your doctor or seek medical assistance if you experience any of the following:  1. Fever greater than 101.5 F. 2. Decrease in urinary output. 3. Increased warmth, swelling, redness, or pain at your incision/wound site. 4. Increased  drainage or bad odor at your incision/wound site.  5. Nausea/vomiting that does not stop.  6. Numbness, tingling, or discoloration of extremity.  7. Unable to drink fluids.  8. Uncontrollable pain.  9. Any other concerning symptoms.   Please, call 911 or go to your nearest emergency room if you experience any of the following:  1. Change in speech, vision, or ability to walk 2. Chest pain.  3. Difficulty breathing or shortness of breath.  Follow-up:  Future Appointments  Date Time Provider Department Center  07/27/2024 10:00 AM Arthea Garnette Brandy, PT [facility] PT PRE [facility]  08/01/2024  2:45 PM Arthea Garnette Brandy, PT [facility] PT PRE [facility]  08/03/2024 12:30 PM Arthea Garnette Brandy, PT [facility] PT PRE [facility]  08/07/2024  3:00 PM Arthea Garnette Brandy, PT [facility] PT PRE [facility]  08/08/2024  1:20 PM [facility]  08/10/2024  2:00 PM Arthea Garnette Brandy, PT [facility] PT PRE [facility]  08/14/2024  3:00 PM Arthea Garnette Brandy, PT [facility] PT PRE [facility]  08/17/2024 12:30 PM Arthea Garnette Brandy, PT [facility] PT PRE [facility]  08/21/2024  3:00 PM Arthea Garnette Brandy, PT [facility] PT PRE [facility]  08/23/2024  4:30 PM Arthea Garnette Brandy, PT [facility] PT PRE [facility]  08/30/2024  1:30 PM Arthea Garnette Brandy, PT [facility] PT PRE [facility]  09/01/2024  1:15 PM Arthea Garnette Brandy, PT [facility] PT PRE [facility]  09/04/2024  3:40 PM Bryan Vinie Haus, MD [facility] MSK JNT [facility] [place]  09/05/2024  2:45 PM Arthea Garnette Brandy, PT [facility] PT PRE [facility]  09/07/2024  2:00 PM Arthea Garnette Brandy, PT [facility] PT PRE [facility]  09/11/2024  3:00 PM Arthea Garnette Brandy, PT [facility] PT PRE [facility]  09/14/2024  2:00 PM Arthea Garnette Brandy, PT [facility] PT PRE [facility]  12/04/2024  2:40 PM Fairy Lamar Lee, PA-C [facility] MSK JNT [facility] Nest Mavis Brothers, NP  Time Spent on Discharge: 30 minutes

## 2024-07-27 ENCOUNTER — Telehealth: Payer: Self-pay

## 2024-07-27 NOTE — Transitions of Care (Post Inpatient/ED Visit) (Signed)
   07/27/2024  Name: [name] MRN: [id number] DOB: 28-Apr-1961  Today's TOC FU Call Status: Today's TOC FU Call Status:: Unsuccessful Call (1st Attempt) Unsuccessful Call (1st Attempt) Date: 07/27/24  Attempted to reach the patient regarding the most recent Inpatient/ED visit.  Follow Up Plan: Additional outreach attempts will be made to reach the patient to complete the Transitions of Care (Post Inpatient/ED visit) call.   Medford Balboa, BSN, RN [place]  [facility]BCI - [employer] [facility] RN Care Manager [phone]

## 2024-07-28 ENCOUNTER — Telehealth: Payer: Self-pay

## 2024-07-28 NOTE — Patient Instructions (Signed)
 Visit Information  Thank you for taking time to visit with me today. Please don't hesitate to contact me if I can be of assistance to you before our next scheduled telephone appointment.  Our next appointment is by telephone on Thursday August 7th at 3:30pm  Following is a copy of your care plan:   Goals Addressed             This Visit's Progress    VBCI Transitions of Care (TOC) Care Plan       Problems:  Recent Hospitalization for treatment of Total Knee Revision   Goal:  Over the next 30 days, the patient will not experience hospital readmission  Interventions:   Surgery (07/26/24): Evaluation of current treatment plan related to Total Knee Revision surgery assessed patient/caregiver understanding of surgical procedure   reviewed post-operative instructions with patient/caregiver addressed questions about post - surgical incision care  reviewed medications with patient and addressed questions reviewed scheduled provider appointments with patient   09/04/24 with the surgeon  Patient Self Care Activities:  Attend all scheduled provider appointments Call pharmacy for medication refills 3-7 days in advance of running out of medications Call provider office for new concerns or questions  Notify RN Care Manager of [facility] call rescheduling needs Participate in Transition of Care Program/Attend [facility] scheduled calls Perform all self care activities independently  Take medications as prescribed    Plan:  Telephone follow up appointment with care management team member scheduled for:  August 7th at 3:30pm        Patient verbalizes understanding of instructions and care plan provided today and agrees to view in MyChart. Active MyChart status and patient understanding of how to access instructions and care plan via MyChart confirmed with patient.     The patient has been provided with contact information for the care management team and has been advised to call with any health  related questions or concerns.   Please call the care guide team at [phone] if you need to cancel or reschedule your appointment.   Please call the Suicide and Crisis Lifeline: 988 call the [place]  National Suicide Prevention Lifeline: 409-605-0655 or TTY: 386-753-5740 TTY 712-114-2401) to talk to a trained counselor if you are experiencing a Mental Health or Behavioral Health Crisis or need someone to talk to.  Medford Balboa, BSN, RN [place]  VBCI - [employer] [facility] RN Care Manager [phone]

## 2024-07-28 NOTE — Transitions of Care (Post Inpatient/ED Visit) (Signed)
 07/28/2024  Name: [name] MRN: [id number] DOB: [birth year]  Today's TOC FU Call Status: Today's TOC FU Call Status:: Successful TOC FU Call Completed TOC FU Call Complete Date: 07/28/24 Patient's Name and Date of Birth confirmed.  Transition Care Management Follow-up Telephone Call Date of Discharge: 07/26/24 Discharge Facility: Other ([facility]) Name of Other ([facility]) Discharge Facility: [facility] Type of Discharge: Inpatient Admission Primary Inpatient Discharge Diagnosis:: Total Knee Revision How have you been since you were released from the hospital?: Better Any questions or concerns?: No  Items Reviewed: Did you receive and understand the discharge instructions provided?: Yes Medications obtained,verified, and reconciled?: Yes (Medications Reviewed) Any new allergies since your discharge?: No Dietary orders reviewed?: Yes Type of Diet Ordered:: Diabetic Do you have support at home?: Yes People in Home [RPT]: spouse Name of Support/Comfort Primary Source: Vernell Ore  Medications Reviewed Today: Medications Reviewed Today     Reviewed by Moises Reusing, RN (Case Manager) on 07/28/24 at 1534  Med List Status: <None>   Medication Order Taking? Sig Documenting Provider Last Dose Status Informant  albuterol  (VENTOLIN  HFA) 108 (90 Base) MCG/ACT inhaler [id number] Yes Inhale 2 puffs into the lungs every 6 (six) hours as needed for wheezing or shortness of breath. Marinda Rocky SAILOR, MD  Active   aspirin EC 81 MG tablet [id number] Yes Take 81 mg by mouth daily. Swallow whole. [provider]  Active   atorvastatin (LIPITOR) 80 MG tablet [id number] Yes Take 1 tablet by mouth daily. [provider]  Active   Calcium Carbonate-Vit D-Min (CALTRATE 600+D PLUS MINERALS) 600-800 MG-UNIT TABS [id number] Yes One tab twice daily O'Sullivan, Melissa, NP  Active   cephALEXin (KEFLEX) 500 MG capsule [id number] Yes Take 500 mg by mouth 2 (two) times daily.  [provider]  Active   cetirizine (ZYRTEC) 10 MG tablet [id number] Yes Take 1 tablet by mouth at bedtime. [provider]  Active   Cholecalciferol 50 MCG (2000 UT) TABS [id number] Yes Take 2 tablets by mouth daily. [provider]  Active   citalopram (CELEXA) 40 MG tablet [id number] Yes Take 40 mg by mouth daily. [provider]  Active   docusate sodium (COLACE) 100 MG capsule [id number] Yes Take 100 mg by mouth 2 (two) times daily. [provider]  Active   empagliflozin (JARDIANCE) 25 MG TABS tablet [id number] Yes Take 25 mg by mouth daily. [provider]  Active   EPINEPHrine  0.3 mg/0.3 mL IJ SOAJ injection [id number] Yes Inject into the muscle. [provider]  Active   fexofenadine  (ALLEGRA ) 180 MG tablet [id number] Yes Take 1 tablet by mouth daily. [provider]  Active   fluticasone  (FLONASE) 50 MCG/ACT nasal spray [id number]  Administer 1 spray in each nostril daily as needed. [provider]  Active   Fluticasone -Salmeterol (ADVAIR DISKUS) 250-50 MCG/DOSE AEPB [id number] Yes Inhale 1 puff into the lungs 2 (two) times daily. Daryl Setter, NP  Active   gabapentin  (NEURONTIN ) 300 MG capsule [id number] Yes Take 2 capsules (600 mg total) by mouth 2 (two) times daily. Daryl Setter, NP  Active   Glucagon 0.5 MG/0.1ML EMMANUEL [id number] Yes Inject into the skin. [provider]  Active   hydrochlorothiazide (HYDRODIURIL) 25 MG tablet [id number] Yes Take 1 tablet by mouth daily. [provider]  Active   hydrOXYzine (ATARAX) 10 MG tablet [id number]  Take by mouth.  Patient not taking: Reported on 07/28/2024   [provider]  Active   Insulin  Glargine (BASAGLAR   KWIKPEN) 100 UNIT/ML [id number]  Inject 10 Units into the skin daily.  Patient not taking: Reported on 07/28/2024   Daryl Setter, NP  Active   Insulin  Pen Needle 31G X 5 MM MISC [id number]  Use as directed. O'Sullivan, Melissa,  NP  Active   ketotifen (ZADITOR) 0.025 % ophthalmic solution [id number] Yes Place 1 drop into both eyes 2 (two)  times daily as needed. [provider]  Active   Lactobacillus (ACIDOPHILUS) CAPS capsule [id number]  Take 2 capsules by mouth daily.  Patient not taking: Reported on 07/28/2024   [provider]  Active   levothyroxine  (SYNTHROID ) 75 MCG tablet [id number] Yes Take 1 tablet (75 mcg total) by mouth daily. O'Sullivan, Melissa, NP  Active   Lidocaine HCl 4 % LIQD [id number]  Apply 1 application topically daily as needed. [provider]  Active   losartan (COZAAR) 100 MG tablet [id number] Yes Take 1 tablet by mouth daily. [provider]  Active   metFORMIN (GLUCOPHAGE-XR) 500 MG 24 hr tablet [id number] Yes Take 1,000 mg by mouth 2 (two) times daily.  [provider]  Active   metoCLOPramide (REGLAN) 5 MG tablet [id number] Yes Take 2.5 mg by mouth at bedtime as needed for nausea. [provider]  Active   montelukast  (SINGULAIR ) 10 MG tablet [id number] Yes Take 1 tablet by mouth at bedtime. [provider]  Active   naloxone [facility]) nasal spray 4 mg/0.1 mL [id number] Yes Place 1 spray into the nose once. Administer 1 spray into affected nostril(s) as needed for opioid reversal. Spray the contents of 1 device into 1 nostril. Call 911. May repeat with 2nd device in alternate nostril if no response in 2-3 minutes. [provider]  Active   JESSE SCHLOSSMAN [id number]  Allergen immunotherapy [provider]  Active Self  olopatadine  (PATANOL) 0.1 % ophthalmic solution [id number] Yes Apply to eye. [provider]  Active   Omega-3 1000 MG CAPS [id number] Yes Take by mouth. [provider]  Active   omeprazole  (PRILOSEC) 20 MG capsule [id number] Yes Take 2 capsules (40 mg total) by mouth daily. Daryl Setter, NP  Active   ondansetron  (ZOFRAN -ODT) 4 MG disintegrating tablet [id number] Yes DISSOLVE ONE TABLET MOUTH  THREE TIMES A DAY AS NEEDED AS DIRECTED BY PROVIDER [provider]  Active   oxycodone  (OXY-IR) 5 MG capsule [id number] Yes Take 5 mg by mouth every 4 (four) hours as needed. [provider]  Active   pioglitazone (ACTOS) 30 MG tablet [id number] Yes Take by mouth. [provider]  Active   pramipexole (MIRAPEX) 0.5 MG tablet [id number] Yes Take 0.5 mg by mouth 3 (three) times daily. [provider]  Active   Propylene Glycol (SYSTANE COMPLETE) 0.6 % SOLN [id number] Yes Apply 1 drop to eye 4 (four) times daily as needed. [provider]  Active   Simethicone BERNICE [id number] Yes  [provider]  Active   sodium chloride  (OCEAN) 0.65 % nasal spray [id number]   [provider]  Active   tadalafil (CIALIS) 20 MG tablet [id number] Yes Take by mouth. [provider]  Active   tamsulosin  (FLOMAX ) 0.4 MG CAPS capsule [id number] Yes TAKE 1 CAPSULE (0.4 MG TOTAL) BY MOUTH DAILY. Daryl Setter, NP  Active   testosterone cypionate (DEPOTESTOSTERONE CYPIONATE) 200 MG/ML injection [id number] Yes Inject 200 mg into the muscle every 14 (fourteen) days. [provider]  Active   urea (CARMOL) 10 % cream [id number]  Apply topically as needed. [provider]  Active             Home Care and Equipment/Supplies: Were Home Health Services Ordered?: No Any new equipment or medical supplies ordered?: No  Functional Questionnaire: Do you need assistance with bathing/showering or dressing?: No Do you need assistance with meal preparation?: No Do you need assistance with eating?: No Do you have difficulty maintaining continence: No Do you need assistance with getting out of bed/getting out of a chair/moving?: No Do you have difficulty managing or taking your medications?: No  Follow up appointments reviewed: PCP Follow-up appointment confirmed?: Yes Date of PCP follow-up appointment?: 08/04/24 Follow-up Provider: Eleanor Ponto Specialist [facility] Follow-up appointment confirmed?: Yes Date of Specialist follow-up appointment?: 09/04/24 Follow-Up Specialty Provider:: Dr. Leah Do you need transportation to your follow-up appointment?: No Do you understand care options if your condition(s) worsen?: Yes-patient verbalized understanding  SDOH Interventions Today    Flowsheet Row Most Recent Value  SDOH Interventions   Food Insecurity Interventions Intervention Not Indicated  Housing Interventions Intervention Not Indicated  Transportation Interventions Intervention Not Indicated  Utilities Interventions Intervention Not Indicated    Goals Addressed             This Visit's Progress    VBCI Transitions of Care (TOC) Care Plan       Problems:  Recent Hospitalization for treatment of Total Knee Revision   Goal:  Over the next 30 days, the patient will not experience hospital readmission  Interventions:   Surgery (07/26/24): Evaluation of current treatment plan related to Total Knee Revision surgery assessed patient/caregiver understanding of surgical procedure   reviewed post-operative instructions with patient/caregiver addressed questions about post - surgical incision care  reviewed medications with patient and addressed questions reviewed scheduled provider appointments with patient   09/04/24 with the surgeon  Patient Self Care Activities:  Attend all scheduled provider appointments Call pharmacy for medication refills 3-7 days in advance of running out of medications Call provider office for new concerns or questions  Notify RN Care Manager of [facility] call rescheduling needs Participate in Transition of Care Program/Attend [facility] scheduled calls Perform all self care activities independently  Take medications as prescribed    Plan:  Telephone follow up appointment with care management team member scheduled for:  August 7th at 3:30pm        Medford Balboa, BSN, RN [place]  VBCI  - [facility] RN Care Manager [phone]

## 2024-08-03 ENCOUNTER — Other Ambulatory Visit: Payer: Self-pay

## 2024-08-03 DIAGNOSIS — Z96652 Presence of left artificial knee joint: Secondary | ICD-10-CM

## 2024-08-03 NOTE — Progress Notes (Signed)
 Physical Therapy Visit - Daily Note   Payor: [employer] / Plan: [facility] / Product Type: Other /   Visit Count: 4   Rehabilitation Precautions/Restrictions:   Precautions/Restrictions Precautions: Hx of falls, DM, HTN, osteopenia, hx of kidney cancer        Referring Diagnosis: Painful total knee replacement, subsequent encounter (T84.84XD, Z96.659)   SUBJECTIVE Patient Report:   Patient reports he was quite nauseous on Tuesday night but is feeling much better today.   Pain:  Pain Assessment Pain Assessment: 0-10 Pain Score  : 7 Pain Location: Knee Pain Orientation: Left Pain Descriptors: Aching, Dull Clinical Progression: Gradually improving  OBJECTIVE  General Observation/Objective Measures: Patient ambulates into clinic using RW, accompanied by spouse, and is in NAD.  L Knee AAROM: 0-90 degrees with strap  Interventions:  Therapeutic Exercises: Recumbent bike rocks x 6 min for knee flexion ROM Slant board calf stretch with LE internal rotation 4 x 30 seconds on L Hamstring stretch at 4 step with internal rotation 3 x 30 seconds on L Prone hamstring MET 5 x 5 holds on left Supine heel slides with slide board and strap assist 3 x 10 on L with PT supporting LE into tibial internal rotation Supine passive knee flexion with PT supporting LE into tibial internal rotation HEP review and discussion  Ice pack to L knee x 5 min post-treatment   Education: Yes, as described in interventions   ASSESSMENT Patient continues to report intermittent sharp posterolateral left knee pain when extending knee out of deep flexion, and exhibits guarding of the hamstrings when this occurs. This was much less pronounced and did not occur when the PT passively extends the knee at a slow pace. Patient remains a candidate for PT to improve functional mobility and strength to allow full recovery from surgery and safe return to PLOF activities.    Therapy  Diagnosis:     ICD-10-CM   1. Painful total knee replacement, subsequent encounter  T84.84XD, Z96.659   2. Decreased range of motion of left knee  M25.662   3. Decreased functional activity tolerance  R68.89   4. Impaired mobility and ADLs  Z74.09, Z78.9   5. Impaired ambulation  R26.2     Progress Towards Goals:     Goals Addressed             This Visit's Progress   . PT Goal       Short Term Goals (STG) - Time Frame 8 visits             STG 1: Patient will be independent and compliant with all HEP     7/31: MET and ongoing              STG 2: Patient will demonstrate L Knee AROM 0-105 degrees in order to allow progression toward LTG.              7/31: Ongoing, L Knee AAROM 0-80 degrees              STG 3: Patient will report 1/10 or less L Knee pain at rest in order to demonstrate improved tolerance to all therapeutic exercises and ambulation.  7/31: Ongoing, 7/10 pain at rest               Long Term Goals (LTG) - Time Frame 15 visits             LTG 1: Patient will improve LEFS Disability Score to 25%  or less in order to demonstrate statistically significant improvement in patient perceived function.  7/31: Ongoing, 83.75% disability on LEFS                          LTG 2: Patient will improve KOOS JR. Interval Score to 70.0 or greater.  7/31: Ongoing, 31.31 on KOOS JR. Interval Score               LTG 3: Patient will demonstrate L Knee AROM of 0-120 degrees or greater to allow patient to safely ascend/descend stairs.  7/31: Ongoing, see objective measures                         LTG 4: Patient will safely ascend and descend 1 flight of stairs without AD and reciprocal gait pattern in order to demonstrate ability to safely navigate stairs at home and in the community.  7/31: Ongoing, not assessed   LTG 5: Patient will perform 14 or more repetitions on 30 second chair rise test to demonstrate decreased risk of falls.   7/31: Ongoing, not assessed          PLAN Treatment Frequency and Duration:  PT Frequency: 2x/week PT Duration: Other (comment) (15 visits) Treatment Plan Details: Up to 2x/week for up to 15 visits  Recommended PT Treatment/Interventions: Dry needling (1-2 muscles) (79439); Dry Needling (3+ muscles) (79438); Electrical stimulation-attended (02967); Electrical stimulation-unattended (02985); Manual therapy (97140); Neuromuscular re-education [phone]); Self-care/home management [phone] Therapeutic activity (97530); Therapeutic exercise (97110); Ultrasound (02964); Vasopneumatic compression (02983)   Recommended Consults:  None currently  Development of Plan of Care:  No change in POC.  Total Treatment Time (Time & Untimed): Total Treatment Time: 38 Total Time in Timed Codes: Time in Timed Codes: 38     Treatment/Procedures Therapeutic Exercises minutes: 38            The patient has been instructed to contact our clinic if any questions or problems should arise.

## 2024-08-03 NOTE — Patient Instructions (Signed)
 Visit Information  Thank you for taking time to visit with me today. Please don't hesitate to contact me if I can be of assistance to you before our next scheduled telephone appointment.  Our next appointment is by telephone on Wednesday August 13th at 3:30pm  Following is a copy of your care plan:   Goals Addressed             This Visit's Progress    VBCI Transitions of Care (TOC) Care Plan       Problems: (reviewed 08/03/24) Recent Hospitalization for treatment of Total Knee Revision, Nausea, Vomiting   Goal: (reviewed 08/03/24) Over the next 30 days, the patient will not experience hospital readmission  Interventions: (reviewed 08/03/24)  Surgery (07/26/24): Evaluation of current treatment plan related to Total Knee Revision surgery assessed patient/caregiver understanding of surgical procedure   reviewed post-operative instructions with patient/caregiver addressed questions about post - surgical incision care  reviewed medications with patient and addressed questions reviewed scheduled provider appointments with patient   09/04/24 with the surgeon Outpatient PT at [facility] twice a week Pharmacy referral for Polypharmacy Education regarding Nausea, vomiting and pain medication  Patient Self Care Activities:  Attend all scheduled provider appointments Call pharmacy for medication refills 3-7 days in advance of running out of medications Call provider office for new concerns or questions  Notify RN Care Manager of [facility] call rescheduling needs Participate in Transition of Care Program/Attend [facility] scheduled calls Perform all self care activities independently  Take medications as prescribed    Plan:  Telephone follow up appointment with care management team member scheduled for:  August 13th at 3:30pm        Patient verbalizes understanding of instructions and care plan provided today and agrees to view in MyChart. Active MyChart status and patient understanding of how to access  instructions and care plan via MyChart confirmed with patient.     The patient has been provided with contact information for the care management team and has been advised to call with any health related questions or concerns.   Please call the care guide team at [phone] if you need to cancel or reschedule your appointment.   Please call the Suicide and Crisis Lifeline: 988 call the [place]  National Suicide Prevention Lifeline: 224 442 1149 or TTY: (619) 243-7499 TTY (517)074-5158) to talk to a trained counselor if you are experiencing a Mental Health or Behavioral Health Crisis or need someone to talk to.  Medford Balboa, BSN, RN [place]  VBCI - [employer] [facility] RN Care Manager [phone]

## 2024-08-03 NOTE — Transitions of Care (Post Inpatient/ED Visit) (Signed)
 Transition of Care week 2  Visit Note  08/03/2024  Name: [name] MRN: [id number]          DOB: 07/04/1961  Situation: Patient enrolled in [facility] 30-day program. Visit completed with Nando Marseille by telephone.   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   Sinus congestion    Sleep apnea    Thyroid  disease     Assessment: Patient Reported Symptoms: Cognitive Cognitive Status: Alert and oriented to person, place, and time      Neurological Neurological Review of Symptoms: No symptoms reported    HEENT HEENT Symptoms Reported: No symptoms reported      Cardiovascular Cardiovascular Symptoms Reported: No symptoms reported    Respiratory Respiratory Symptoms Reported: No symptoms reported    Endocrine Endocrine Symptoms Reported: No symptoms reported Is patient diabetic?: Yes Is patient checking blood sugars at home?: Yes List most recent blood sugar readings, include date and time of day: 119 AM    Gastrointestinal Gastrointestinal Symptoms Reported: Nausea, Vomiting Gastrointestinal Management Strategies: Medication therapy, Coping strategies Gastrointestinal Comment: The patient states he called [facility] who has called in some different medication for his nausea. He also believes that this [facility] provider has called in some different pain medication. Recommended taking  Reglan at night    Genitourinary Genitourinary Symptoms Reported: No symptoms reported     Integumentary Integumentary Symptoms Reported: Incision Skin Comment: Covered with a bandage until follow up appointment  Musculoskeletal Musculoskelatal Symptoms Reviewed: Joint pain Additional Musculoskeletal Details: Left knee Musculoskeletal Management Strategies: Routine screening, Exercise, Medical device, Medication therapy Musculoskeletal Comment: Attending Outpatient PT twice a week at the [place].      Psychosocial Psychosocial Symptoms Reported: No symptoms reported         There were no vitals filed for this visit.  Medications Reviewed Today     Reviewed by Moises Reusing, RN (Case Manager) on 08/03/24 at 1548  Med List Status: <None>   Medication Order Taking? Sig Documenting Provider Last Dose Status Informant  albuterol  (VENTOLIN  HFA) 108 (90 Base) MCG/ACT inhaler [id number]  Inhale 2 puffs into the lungs every 6 (six) hours as needed for wheezing or shortness of breath. Marinda Rocky SAILOR, MD  Active   aspirin EC 81 MG tablet [id number]  Take 81 mg by mouth daily. Swallow whole. [provider]  Active   atorvastatin (LIPITOR) 80 MG tablet [id number]  Take 1 tablet by mouth daily. [provider]  Active   Calcium Carbonate-Vit D-Min (CALTRATE 600+D PLUS MINERALS) 600-800 MG-UNIT TABS [id number]  One tab twice daily O'Sullivan, Melissa, NP  Active   cephALEXin (KEFLEX) 500 MG capsule [id number]  Take 500 mg by mouth 2 (two) times daily. [provider]  Active   cetirizine (ZYRTEC) 10 MG tablet [id number]  Take 1 tablet  by mouth at bedtime. [provider]  Active   Cholecalciferol 50 MCG (2000 UT) TABS [id number]  Take 2 tablets by mouth daily. [provider]  Active   citalopram (CELEXA) 40 MG tablet [id number]  Take 40 mg by mouth daily. [provider]  Active   docusate sodium (COLACE) 100 MG capsule [id number]  Take 100 mg by mouth 2 (two) times daily. [provider]  Active   empagliflozin (JARDIANCE) 25 MG TABS  tablet [id number]  Take 25 mg by mouth daily. [provider]  Active   EPINEPHrine  0.3 mg/0.3 mL IJ SOAJ injection [id number]  Inject into the muscle. [provider]  Active   fexofenadine  (ALLEGRA ) 180 MG tablet [id number]  Take 1 tablet by mouth daily. [provider]  Active   fluticasone  (FLONASE) 50 MCG/ACT nasal spray [id number]  Administer 1 spray in each nostril daily as needed. [provider]  Active   Fluticasone -Salmeterol (ADVAIR DISKUS) 250-50 MCG/DOSE AEPB [id number]  Inhale 1 puff into the lungs 2 (two) times daily. Daryl Setter, NP  Active   gabapentin  (NEURONTIN ) 300 MG capsule [id number]  Take 2 capsules (600 mg total) by mouth 2 (two) times daily. Daryl Setter, NP  Active   Glucagon 0.5 MG/0.1ML EMMANUEL [id number]  Inject into the skin. [provider]  Active   hydrochlorothiazide (HYDRODIURIL) 25 MG tablet [id number]  Take 1 tablet by mouth daily. [provider]  Active   hydrOXYzine (ATARAX) 10 MG tablet [id number]  Take by mouth.  Patient not taking: Reported on 07/28/2024   [provider]  Active   Insulin  Glargine (BASAGLAR  KWIKPEN) 100 UNIT/ML [id number]  Inject 10 Units into the skin daily.  Patient not taking: Reported on 07/28/2024   Daryl Setter, NP  Active   Insulin  Pen Needle 31G X 5 MM MISC [id number]  Use as directed. O'Sullivan, Melissa, NP  Active   ketotifen (ZADITOR) 0.025 % ophthalmic solution [id number]  Place 1 drop into both eyes 2 (two)  times daily as needed. [provider]  Active   Lactobacillus (ACIDOPHILUS) CAPS capsule [id number]  Take 2 capsules by mouth daily.  Patient not taking: Reported on 07/28/2024   [provider]  Active   levothyroxine  (SYNTHROID ) 75 MCG tablet [id number]  Take 1 tablet (75 mcg total) by mouth daily. O'Sullivan, Melissa, NP  Active   Lidocaine HCl 4 % LIQD [id number]  Apply 1 application topically daily as needed. [provider]   Active   losartan (COZAAR) 100 MG tablet [id number]  Take 1 tablet by mouth daily. [provider]  Active   metFORMIN (GLUCOPHAGE-XR) 500 MG 24 hr tablet [id number]  Take 1,000 mg by mouth 2 (two) times daily.  [provider]  Active   metoCLOPramide (REGLAN) 5 MG tablet [id number]  Take 2.5 mg by mouth at bedtime as needed for nausea. [provider]  Active   montelukast  (SINGULAIR ) 10 MG tablet [id number]  Take 1 tablet by mouth at bedtime. [provider]  Active   naloxone [facility]) nasal spray 4 mg/0.1 mL [id number]  Place 1 spray into the nose once. Administer 1 spray into affected nostril(s) as needed for opioid reversal. Spray the contents of 1 device into 1 nostril. Call 911. May repeat with 2nd device in alternate nostril if no response in 2-3 minutes. [provider]  Active   JESSE SCHLOSSMAN [id number]  Allergen immunotherapy [provider]  Active Self  olopatadine  (PATANOL) 0.1 % ophthalmic solution [id number]  Apply to eye. [provider]  Active   Omega-3 1000 MG CAPS [id number]  Take by mouth. [provider]  Active   omeprazole  (PRILOSEC) 20 MG capsule [id number]  Take 2 capsules (40 mg total) by mouth daily. Daryl Setter, NP  Active   ondansetron  (ZOFRAN -ODT) 4 MG disintegrating tablet [id number]  DISSOLVE ONE TABLET MOUTH THREE TIMES A DAY AS NEEDED AS DIRECTED BY PROVIDER [provider]  Active   oxycodone  (OXY-IR) 5 MG capsule [id number]  Take 5 mg by mouth every 4 (four) hours as needed. [provider]  Active   pioglitazone (ACTOS) 30 MG tablet [id number]  Take by mouth. [provider]  Active   pramipexole (MIRAPEX) 0.5 MG tablet [id number]  Take 0.5 mg by mouth 3 (three) times daily. [provider]  Active   Propylene Glycol (SYSTANE COMPLETE) 0.6 % SOLN [id number]  Apply 1 drop to eye 4 (four) times daily as needed. [provider]  Active   Simethicone BERNICE  [id number]   [provider]  Active   sodium chloride  (OCEAN) 0.65 % nasal spray [id number]   [provider]  Active   tadalafil (CIALIS) 20 MG tablet [id number]  Take by mouth. [provider]  Active   tamsulosin  (FLOMAX ) 0.4 MG CAPS capsule [id number]  TAKE 1 CAPSULE (0.4 MG TOTAL) BY MOUTH DAILY. Daryl Setter, NP  Active   testosterone cypionate (DEPOTESTOSTERONE CYPIONATE) 200 MG/ML injection [id number]  Inject 200 mg into the muscle every 14 (fourteen) days. [provider]  Active   urea (CARMOL) 10 % cream [id number]  Apply topically as needed. [provider]  Active             Recommendation:   Continue Current Plan of Care  Follow Up Plan:   Telephone follow-up in 1 week  Medford Balboa, BSN, RN [place]  [facility] RN Care Manager [phone]

## 2024-08-04 ENCOUNTER — Ambulatory Visit: Admitting: Family

## 2024-08-04 VITALS — BP 135/76 | HR 92 | Temp 97.5°F | Resp 16 | Ht 64.0 in | Wt 201.0 lb

## 2024-08-04 DIAGNOSIS — E6609 Other obesity due to excess calories: Secondary | ICD-10-CM | POA: Diagnosis not present

## 2024-08-04 DIAGNOSIS — Z7984 Long term (current) use of oral hypoglycemic drugs: Secondary | ICD-10-CM

## 2024-08-04 DIAGNOSIS — E66811 Obesity, class 1: Secondary | ICD-10-CM | POA: Diagnosis not present

## 2024-08-04 DIAGNOSIS — E1149 Type 2 diabetes mellitus with other diabetic neurological complication: Secondary | ICD-10-CM

## 2024-08-04 DIAGNOSIS — Z7985 Long-term (current) use of injectable non-insulin antidiabetic drugs: Secondary | ICD-10-CM

## 2024-08-04 DIAGNOSIS — Z96652 Presence of left artificial knee joint: Secondary | ICD-10-CM

## 2024-08-04 DIAGNOSIS — C642 Malignant neoplasm of left kidney, except renal pelvis: Secondary | ICD-10-CM

## 2024-08-04 DIAGNOSIS — Z6834 Body mass index (BMI) 34.0-34.9, adult: Secondary | ICD-10-CM | POA: Diagnosis not present

## 2024-08-04 DIAGNOSIS — Z85528 Personal history of other malignant neoplasm of kidney: Secondary | ICD-10-CM | POA: Diagnosis not present

## 2024-08-04 DIAGNOSIS — K3184 Gastroparesis: Secondary | ICD-10-CM

## 2024-08-04 MED ORDER — OZEMPIC (2 MG/DOSE) 8 MG/3ML ~~LOC~~ SOPN: 2.0000 mg | PEN_INJECTOR | SUBCUTANEOUS | Status: AC

## 2024-08-04 NOTE — Assessment & Plan Note (Addendum)
 Nausea likely worsened by GLP-1 and narcotic pain treatment.  Hopefully the [facility] will take him off of the GLP-1. He has rx for reglan that is being prescribed by ortho. Continue same.

## 2024-08-04 NOTE — Assessment & Plan Note (Signed)
  Type 2 diabetes managed with Ozempic  (prescribed by [facility]), Jardiance, and Actos. A1c at 6.9. Concerns about Ozempic  worsening gastroparesis. - He will discuss possibility of stopping Ozempic  at his follow up visit with Endo in early October and insulin  restart with [facility].  - Monitor glucose levels and A1c. - Coordinate medication adjustments with [facility].

## 2024-08-04 NOTE — Assessment & Plan Note (Signed)
 Had left sided nephrectomy 2003.  Continues to follow with urology.

## 2024-08-04 NOTE — Progress Notes (Addendum)
 Subjective:     Patient ID: [name], male    DOB: 1961-07-29, 63 y.o.   MRN: [id number]  Chief Complaint  Patient presents with   Follow-up    Here for follow up after left knee surgery     HPI  Discussed the use of AI scribe software for clinical note transcription with the patient, who gave verbal consent to proceed.  History of Present Illness  [name] is a 63 year old male with gastroparesis who presents with post-operative nausea and pain following left knee replacement surgery. He is accompanied by his wife today.  He underwent left knee replacement surgery on July 25, 2024, and has been experiencing significant pain, nausea, and vomiting since then. He is engaged in physical therapy and has a post-operative appointment scheduled for August 08, 2024. He uses an ice machine for his knee and has been prescribed oxycodone  for pain, which he tries to minimize due to constipation, managed with Dulcolax. Severe nausea and vomiting occurred on the night of August 01, 2024, into the early morning of August 02, 2024.  He has gastroparesis, which he states was confirmed by a nuclear test  at the [place] showing minimal gastric movement, and is on 2 mg of Ozempic , which he believes worsens his nausea, especially with pain medications. Nausea peaks two to three days after his weekly Ozempic  injection. He uses Reglan for nausea, initially prescribed as needed.  He has a history of a previous knee replacement with a tibial prosthesis that rotated outward, causing a 25-degree outward foot shift, corrected after two years. He is concerned about similar misalignment with the current prosthesis, as his foot shifts outward when walking.  He is on Jardiance 25 mg, Ozempic  2mg  and Actos 30 mg for diabetes management, with a last A1c of 6.9. He states that he was taken off of basaglar  insulin  by the [facility] months ago.     Health Maintenance Due  Topic Date Due   Diabetic  kidney evaluation - Urine ACR  Never done   OPHTHALMOLOGY EXAM  05/19/2019   COVID-19 Vaccine (4 - 2024-25 season) 08/29/2023   INFLUENZA VACCINE  07/28/2024    Past Medical History:  Diagnosis Date   Arthritis    Asthma    Cancer of kidney (HCC)    s/p L nephrectomy (renal cell carcinoma)   Carpal tunnel syndrome    Compression fracture of lumbar vertebra (HCC)    Depression    Deviated septum    Diabetes (HCC)    Fatty liver 03/08/2017   Fibromyalgia    Hepatitis A 1980   History of kidney cancer    Removed left kidney    Hypercholesteremia    Hypertension    Kidney disease    Migraine    Morbid obesity (HCC) 01/06/2017   Obesity    S/P revision of total knee, 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   Sinus congestion    Sleep apnea    Thyroid  disease     Past Surgical History:  Procedure Laterality Date   ANKLE SURGERY     BACK SURGERY  2007   COLONOSCOPY WITH ESOPHAGOGASTRODUODENOSCOPY (EGD)  2015   [facility] Found a hernia in the diaphragm   HAND SURGERY Right    KNEE SURGERY Left    X2   NASAL SEPTUM SURGERY  02/22/2019   turbinate reduction   NEPHRECTOMY Left 2003   ROTATOR CUFF REPAIR Right     Family History  Problem Relation Age  of Onset   Glaucoma Maternal Grandmother    Leukemia Maternal Grandfather    Heart attack Brother    Diabetes Brother    Allergies Brother    Diabetes Brother    Allergies Sister    Asthma Sister    Heart disease Mother    Rheumatologic disease Mother    Lupus Mother    Angioedema Mother    Irritable bowel syndrome Mother    Diverticulosis Mother    Other Mother 72       Colon Resecction    Heart disease Father    Diabetes Father    Liver disease Father    Rheumatologic disease Sister    Cancer Paternal Grandfather        leukemia   Asthma Child    Allergic rhinitis Child    Esophageal cancer Maternal Aunt    Eczema Neg Hx    Urticaria Neg Hx     Social History   Socioeconomic History   Marital status: Married    Spouse  name: Not on file   Number of children: 1   Years of education: Not on file   Highest education level: Associate degree: occupational, Scientist, product/process development, or vocational program  Occupational History   Occupation: Retired  Retail banker   Tobacco Use   Smoking status: Some Days    Types: Cigars, Cigarettes    Start date: 1979    Passive exposure: Current   Smokeless tobacco: Never  Vaping Use   Vaping status: Never Used  Substance and Sexual Activity   Alcohol use: Yes    Alcohol/week: 0.0 standard drinks of alcohol    Comment: occasionally   Drug use: Never   Sexual activity: Yes  Other Topics Concern   Not on file  Social History Narrative   1 son born 2004 [place]   Married   On Disability from [place]- (back pain, knee pain, ankle pain, neuropathy)   Completed associates degree   Former Retail banker, 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56   Enjoys reading, Media planner, volunteering   Social Drivers of Corporate investment banker Strain: Medium Risk (06/05/2024)   Overall Financial Resource Strain (CARDIA)    Difficulty of Paying Living Expenses: Somewhat hard  Food Insecurity: No Food Insecurity (07/28/2024)   Hunger Vital Sign    Worried About Running Out of Food in the Last Year: Never true    Ran Out of Food in the Last Year: Never true  Recent Concern: Food Insecurity - Food Insecurity Present (06/05/2024)   Hunger Vital Sign    Worried About Running Out of Food in the Last Year: Sometimes true    Ran Out of Food in the Last Year: Never true  Transportation Needs: No Transportation Needs (07/28/2024)   PRAPARE - Administrator, Civil Service (Medical): No    Lack of Transportation (Non-Medical): No  Physical Activity: Insufficiently Active (06/05/2024)   Exercise Vital Sign    Days of Exercise per Week: 2 days    Minutes of Exercise per Session: 60 min  Stress: No Stress Concern Present (06/06/2024)   [employer] of Occupational Health - Occupational  Stress Questionnaire    Feeling of Stress : Not at all  Social Connections: Moderately Integrated (06/05/2024)   Social Connection and Isolation Panel    Frequency of Communication with Friends and Family: More than three times a week    Frequency of Social Gatherings with Friends and Family: Once  a week    Attends Religious Services: 1 to 4 times per year    Active Member of Clubs or Organizations: No    Attends Banker Meetings: Not on file    Marital Status: Married  Catering manager Violence: Not At Risk (07/28/2024)   Humiliation, Afraid, Rape, and Kick questionnaire    Fear of Current or Ex-Partner: No    Emotionally Abused: No    Physically Abused: No    Sexually Abused: No    Outpatient Medications Prior to Visit  Medication Sig Dispense Refill   albuterol  (VENTOLIN  HFA) 108 (90 Base) MCG/ACT inhaler Inhale 2 puffs into the lungs every 6 (six) hours as needed for wheezing or shortness of breath. 8 g 2   aspirin EC 81 MG tablet Take 81 mg by mouth daily. Swallow whole.     atorvastatin (LIPITOR) 80 MG tablet Take 1 tablet by mouth daily.     Calcium Carbonate-Vit D-Min (CALTRATE 600+D PLUS MINERALS) 600-800 MG-UNIT TABS One tab twice daily     cephALEXin (KEFLEX) 500 MG capsule Take 500 mg by mouth 2 (two) times daily.     cetirizine (ZYRTEC) 10 MG tablet Take 1 tablet by mouth at bedtime.     Cholecalciferol 50 MCG (2000 UT) TABS Take 2 tablets by mouth daily.     citalopram (CELEXA) 40 MG tablet Take 40 mg by mouth daily.     docusate sodium (COLACE) 100 MG capsule Take 100 mg by mouth 2 (two) times daily.     empagliflozin (JARDIANCE) 25 MG TABS tablet Take 25 mg by mouth daily.     EPINEPHrine  0.3 mg/0.3 mL IJ SOAJ injection Inject into the muscle.     fexofenadine  (ALLEGRA ) 180 MG tablet Take 1 tablet by mouth daily.     fluticasone  (FLONASE) 50 MCG/ACT nasal spray Administer 1 spray in each nostril daily as needed.     Fluticasone -Salmeterol (ADVAIR DISKUS)  250-50 MCG/DOSE AEPB Inhale 1 puff into the lungs 2 (two) times daily.  3   gabapentin  (NEURONTIN ) 300 MG capsule Take 2 capsules (600 mg total) by mouth 2 (two) times daily.     Glucagon 0.5 MG/0.1ML SOAJ Inject into the skin.     hydrochlorothiazide (HYDRODIURIL) 25 MG tablet Take 1 tablet by mouth daily.     hydrOXYzine (ATARAX) 10 MG tablet Take by mouth.     Insulin  Pen Needle 31G X 5 MM MISC Use as directed. 100 each 4   ketotifen (ZADITOR) 0.025 % ophthalmic solution Place 1 drop into both eyes 2 (two)  times daily as needed.     Lactobacillus (ACIDOPHILUS) CAPS capsule Take 2 capsules by mouth daily.     levothyroxine  (SYNTHROID ) 75 MCG tablet Take 1 tablet (75 mcg total) by mouth daily. 30 tablet 3   Lidocaine HCl 4 % LIQD Apply 1 application topically daily as needed.     losartan (COZAAR) 100 MG tablet Take 1 tablet by mouth daily.     metFORMIN (GLUCOPHAGE-XR) 500 MG 24 hr tablet Take 1,000 mg by mouth 2 (two) times daily.      metoCLOPramide (REGLAN) 5 MG tablet Take 2.5 mg by mouth at bedtime as needed for nausea.     montelukast  (SINGULAIR ) 10 MG tablet Take 1 tablet by mouth at bedtime.     naloxone (NARCAN) nasal spray 4 mg/0.1 mL Place 1 spray into the nose once. Administer 1 spray into affected nostril(s) as needed for opioid reversal. Spray the contents of 1  device into 1 nostril. Call 911. May repeat with 2nd device in alternate nostril if no response in 2-3 minutes.     NON FORMULARY Allergen immunotherapy     olopatadine  (PATANOL) 0.1 % ophthalmic solution Apply to eye.     Omega-3 1000 MG CAPS Take by mouth.     omeprazole  (PRILOSEC) 20 MG capsule Take 2 capsules (40 mg total) by mouth daily.     ondansetron  (ZOFRAN -ODT) 4 MG disintegrating tablet DISSOLVE ONE TABLET MOUTH THREE TIMES A DAY AS NEEDED AS DIRECTED BY PROVIDER     oxycodone  (OXY-IR) 5 MG capsule Take 5 mg by mouth every 4 (four) hours as needed.     pioglitazone (ACTOS) 30 MG tablet Take by mouth.      pramipexole (MIRAPEX) 0.5 MG tablet Take 0.5 mg by mouth 3 (three) times daily.     Propylene Glycol (SYSTANE COMPLETE) 0.6 % SOLN Apply 1 drop to eye 4 (four) times daily as needed.     Simethicone LIQD      sodium chloride  (OCEAN) 0.65 % nasal spray      tadalafil (CIALIS) 20 MG tablet Take by mouth.     tamsulosin  (FLOMAX ) 0.4 MG CAPS capsule TAKE 1 CAPSULE (0.4 MG TOTAL) BY MOUTH DAILY. 30 capsule 5   testosterone cypionate (DEPOTESTOSTERONE CYPIONATE) 200 MG/ML injection Inject 200 mg into the muscle every 14 (fourteen) days.     urea (CARMOL) 10 % cream Apply topically as needed.     Insulin  Glargine (BASAGLAR  KWIKPEN) 100 UNIT/ML Inject 10 Units into the skin daily. 3 mL 2   No facility-administered medications prior to visit.    Allergies  Allergen Reactions   Hydrocodone Itching   Niacin And Related Hives    ROS See HPI    Objective:    Physical Exam Constitutional:      Appearance: Normal appearance.  Cardiovascular:     Rate and Rhythm: Normal rate.  Pulmonary:     Effort: Pulmonary effort is normal.  Musculoskeletal:     Comments: swelling noted left knee without warmth or redness  Skin:    Comments: Left knee incision is clean/dry/intact with appropriate scabbing  Neurological:     Mental Status: He is alert.  Psychiatric:        Attention and Perception: Attention normal.        Mood and Affect: Mood normal.        Speech: Speech normal.        Behavior: Behavior normal.      BP 135/76 (BP Location: Right Arm, Patient Position: Sitting, Cuff Size: Normal)   Pulse 92   Temp (!) 97.5 F (36.4 C) (Oral)   Resp 16   Ht 5' 4 (1.626 m)   Wt 201 lb (91.2 kg)   SpO2 98%   BMI 34.50 kg/m  Wt Readings from Last 3 Encounters:  08/04/24 201 lb (91.2 kg)  06/20/24 211 lb (95.7 kg)  06/06/24 206 lb (93.4 kg)       Assessment & Plan:   Problem List Items Addressed This Visit       Unprioritized   Type 2 diabetes mellitus with neurological  complications (HCC) - Primary    Type 2 diabetes managed with Ozempic  (prescribed by [facility]), Jardiance, and Actos. A1c at 6.9. Concerns about Ozempic  worsening gastroparesis. - He will discuss possibility of stopping Ozempic  at his follow up visit with Endo in early October and [facility] with [facility].  - Monitor glucose levels and  A1c. - Co[facility] medication adjustments with [facility].      Relevant Medications   Semaglutide , 2 MG/DOSE, (OZEMPIC , 2 MG/DOSE,) 8 MG/3ML SOPN   S/P total knee arthroplasty   He is having a good bit of pain laterally which he attributes to his left foot supination of 25 degrees.  He has follow up scheduled with orthopedics to discuss.  In the meantime he is using oxycodone  which is being provided by Ortho for pain management.      Morbid obesity (HCC)   Relevant Medications   Semaglutide , 2 MG/DOSE, (OZEMPIC , 2 MG/DOSE,) 8 MG/3ML SOPN   History of renal cell carcinoma   Had left sided nephrectomy 2003.  Continues to follow with urology.      Gastroparesis   Nausea likely worsened by GLP-1 and narcotic pain treatment.  [facility]fully the [facility] will take him off of the GLP-1. He has rx for reglan that is being prescribed by ortho. Continue same.        I have discontinued Madyx J. Michaelsen Nando's Basaglar  KwikPen. I am also having him start on Ozempic  (2 MG/DOSE). Additionally, I am having him maintain his citalopram, metFORMIN, Lidocaine HCl, urea, empagliflozin, testosterone cypionate, tamsulosin , levothyroxine , NON FORMULARY, pramipexole, Fluticasone -Salmeterol, albuterol , atorvastatin, cetirizine, Cholecalciferol, EPINEPHrine , fluticasone , Glucagon, ketotifen, Acidophilus, losartan, fexofenadine , montelukast , Omega-3, ondansetron , sodium chloride , tadalafil, Simethicone, pioglitazone, hydrochlorothiazide, olopatadine , hydrOXYzine, Insulin  Pen Needle, Caltrate 600+D Plus Minerals, omeprazole , gabapentin , oxycodone , docusate sodium, aspirin EC, cephALEXin, metoCLOPramide,  naloxone, and Systane Complete.  Meds ordered this encounter  Medications   Semaglutide , 2 MG/DOSE, (OZEMPIC , 2 MG/DOSE,) 8 MG/3ML SOPN    Sig: Inject 2 mg into the skin once a week.    Supervising Provider:   DOMENICA BLACKBIRD A [phone]

## 2024-08-04 NOTE — Assessment & Plan Note (Signed)
 He is having a good bit of pain laterally which he attributes to his left foot supination of 25 degrees.  He has follow up scheduled with orthopedics to discuss.  In the meantime he is using oxycodone  which is being provided by Ortho for pain management.

## 2024-08-07 ENCOUNTER — Telehealth: Payer: Self-pay

## 2024-08-07 ENCOUNTER — Ambulatory Visit: Payer: No Typology Code available for payment source | Admitting: Internal Medicine

## 2024-08-07 NOTE — Progress Notes (Signed)
 The patient presented for physical therapy visit and was warming up on the recumbent bike when he reported onset of dizziness and lightheadedness secondary to pain. Patient was taken to rest in a chair and his blood pressure was taken.   The patient's blood pressure reading was 80/49 and the patient was provided with apple juice while resting. Due to low blood pressure reading, it was decided to hold off on physical therapy for the day. The patient has a post-operative visit with MD tomorrow and will follow up with PT afterwards to make up this visit, patient verbalized understanding.

## 2024-08-07 NOTE — Progress Notes (Unsigned)
 Care Guide Pharmacy Note  08/07/2024 Name: [name] MRN: [id number] DOB: Feb 14, 1961  Referred By: Daryl Setter, NP Reason for referral: Complex Care Management (Outreach to schedule with Pharm d )   [name] is a 63 y.o. year old male who is a primary care patient of Daryl Setter, NP.  [name] was referred to the pharmacist for assistance related to: polypharmacy   An unsuccessful telephone outreach was attempted today to contact the patient who was referred to the pharmacy team for assistance with medication management. Additional attempts will be made to contact the patient.  [name] , RMA     [facility] Health  [facility], [facility] Guide  Direct Dial: [phone]  Website: [email or web address]

## 2024-08-09 ENCOUNTER — Other Ambulatory Visit: Payer: Self-pay

## 2024-08-09 NOTE — Patient Instructions (Signed)
 Visit Information  Thank you for taking time to visit with me today. Please don't hesitate to contact me if I can be of assistance to you before our next scheduled telephone appointment.  Our next appointment is by telephone on Wednesday August 20th at 3:00pm  Following is a copy of your care plan:   Goals Addressed             This Visit's Progress    VBCI Transitions of Care (TOC) Care Plan       Problems: (reviewed 08/09/24) Recent Hospitalization for treatment of Total Knee Revision, Nausea, Vomiting   Goal: (reviewed 08/09/24) Over the next 30 days, the patient will not experience hospital readmission  Interventions: (reviewed 08/09/24)  Surgery (07/26/24): Evaluation of current treatment plan related to Total Knee Revision surgery assessed patient/caregiver understanding of surgical procedure   reviewed post-operative instructions with patient/caregiver addressed questions about post - surgical incision care  reviewed medications with patient and addressed questions reviewed scheduled provider appointments with patient   09/04/24 with the surgeon Outpatient PT at the [facility] twice a week Pharmacy referral for Polypharmacy Education regarding Nausea, vomiting and pain medication  Patient Self Care Activities:  Attend all scheduled provider appointments Call pharmacy for medication refills 3-7 days in advance of running out of medications Call provider office for new concerns or questions  Notify RN Care Manager of [facility] call rescheduling needs Participate in Transition of Care Program/Attend TOC scheduled calls Perform all self care activities independently  Take medications as prescribed   PCP Follow Up - 08/07/24 - Completed. Started on Ozempic  8mg /33ml - Take 2mg    Plan:  Telephone follow up appointment with care management team member scheduled for:  August 13th at 3:30pm        Patient verbalizes understanding of instructions and care plan provided today and agrees to  view in MyChart. Active MyChart status and patient understanding of how to access instructions and care plan via MyChart confirmed with patient.     The patient has been provided with contact information for the care management team and has been advised to call with any health related questions or concerns.   Please call the care guide team at [phone] if you need to cancel or reschedule your appointment.   Please call the Suicide and Crisis Lifeline: 988 call the [place]  National Suicide Prevention Lifeline: (618)218-7416 or TTY: 682-187-1530 TTY (618)426-0069) to talk to a trained counselor if you are experiencing a Mental Health or Behavioral Health Crisis or need someone to talk to.  Medford Balboa, BSN, RN [place]  VBCI - [employer] [facility] RN Care Manager [phone]

## 2024-08-09 NOTE — Transitions of Care (Post Inpatient/ED Visit) (Signed)
 Transition of Care week 3  Visit Note  08/09/2024  Name: [name] MRN: [id number]          DOB: [birth year]  Situation: Patient enrolled in [facility] 30-day program. Visit completed with Nando Manygoats by telephone.   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   Sinus congestion    Sleep apnea    Thyroid  disease     Assessment: Patient Reported Symptoms: Cognitive Cognitive Status: Alert and oriented to person, place, and time      Neurological Neurological Review of Symptoms: No symptoms reported    HEENT HEENT Symptoms Reported: No symptoms reported      Cardiovascular Cardiovascular Symptoms Reported: No symptoms reported Weight: 199 lb (90.3 kg)  Respiratory Respiratory Symptoms Reported: No symptoms reported    Endocrine Endocrine Symptoms Reported: No symptoms reported Is patient checking blood sugars at home?: No    Gastrointestinal Gastrointestinal Symptoms Reported: Nausea Gastrointestinal Management Strategies: Medication therapy, Coping strategies Gastrointestinal Comment: The patient is now taking Phenergan  before he takes his Oxycodone  and goes to PT. He is not taking the Reglan right now.    Genitourinary Genitourinary Symptoms Reported: No symptoms reported    Integumentary Integumentary Symptoms Reported: Incision Skin Comment: The bandage has been taken off and the incision is without signs and symptoms of infections  Musculoskeletal  Musculoskelatal Symptoms Reviewed: Joint pain, Difficulty walking, Limited mobility Additional Musculoskeletal Details: Total Left Knee Revisison Musculoskeletal Management Strategies: Routine screening, Medication therapy, Medical device, Adequate rest, Exercise Musculoskeletal Comment: Attends Outpatient PT twice a week. He is improving in his ROM and his pain is decreasing.      Psychosocial Psychosocial Symptoms Reported: No symptoms reported         There were no vitals filed for this visit.  Medications Reviewed Today     Reviewed by Moises Reusing, RN (Case Manager) on 08/09/24 at 1538  Med List Status: <None>   Medication Order Taking? Sig Documenting Provider Last Dose Status Informant  albuterol  (VENTOLIN  HFA) 108 (90 Base) MCG/ACT inhaler [id number]  Inhale 2 puffs into the lungs every 6 (six) hours as needed for wheezing or shortness of breath. Marinda Rocky SAILOR, MD  Active   aspirin EC 81 MG tablet [id number]  Take 81 mg by mouth daily. Swallow whole. [provider]  Active   atorvastatin (LIPITOR) 80 MG tablet [id number]  Take 1 tablet by mouth daily. [provider]  Active   Calcium Carbonate-Vit D-Min (CALTRATE 600+D PLUS MINERALS) 600-800 MG-UNIT TABS [id number]  One tab twice daily O'Sullivan, Melissa, NP  Active   cephALEXin (KEFLEX) 500 MG capsule [id number]  Take 500 mg by mouth 2 (two) times daily. [provider]  Active   cetirizine (ZYRTEC) 10 MG tablet [id number]  Take 1 tablet by mouth at bedtime. [provider]  Active   Cholecalciferol 50 MCG (2000 UT) TABS [id number]  Take 2 tablets by mouth daily. [provider]  Active   citalopram (CELEXA) 40 MG tablet [id number]  Take 40 mg by mouth daily. [provider]  Active   docusate sodium (COLACE) 100 MG capsule [id number]  Take 100 mg by mouth 2 (two) times daily. [provider]  Active   empagliflozin (JARDIANCE) 25 MG TABS tablet [id number]  Take 25 mg  by mouth daily. [provider]  Active   EPINEPHrine  0.3 mg/0.3 mL IJ SOAJ injection [id number]  Inject into the muscle. [provider]  Active   fexofenadine  (ALLEGRA ) 180 MG tablet [id number]  Take 1 tablet by mouth daily. [provider]  Active   fluticasone  (FLONASE) 50 MCG/ACT nasal spray [id number]  Administer 1 spray in each nostril daily as needed. [provider]  Active   Fluticasone -Salmeterol (ADVAIR DISKUS) 250-50 MCG/DOSE AEPB [id number]  Inhale 1 puff into the lungs 2 (two) times daily. Daryl Setter, NP  Active   gabapentin  (NEURONTIN ) 300 MG capsule [id number]  Take 2 capsules (600 mg total) by mouth 2 (two) times daily. Daryl Setter, NP  Active   Glucagon 0.5 MG/0.1ML EMMANUEL [id number]  Inject into the skin. [provider]  Active   hydrochlorothiazide (HYDRODIURIL) 25 MG tablet [id number]  Take 1 tablet by mouth daily. [provider]  Active   hydrOXYzine (ATARAX) 10 MG tablet [id number]  Take by mouth. [provider]  Active   Insulin  Pen Needle 31G X 5 MM MISC [id number]  Use as directed. O'Sullivan, Melissa, NP  Active   ketotifen (ZADITOR) 0.025 % ophthalmic solution [id number]  Place 1 drop into both eyes 2 (two)  times daily as needed. [provider]  Active   Lactobacillus (ACIDOPHILUS) CAPS capsule [id number]  Take 2 capsules by mouth daily. [provider]  Active   levothyroxine  (SYNTHROID ) 75 MCG tablet [id number]  Take 1 tablet (75 mcg total) by mouth daily. O'Sullivan, Melissa, NP  Active   Lidocaine HCl 4 % LIQD [id number]  Apply 1 application topically daily as needed. [provider]  Active   losartan (COZAAR) 100 MG tablet [id number]  Take 1 tablet by mouth daily. [provider]  Active   metFORMIN (GLUCOPHAGE-XR) 500 MG 24 hr tablet [id number]  Take 1,000 mg by mouth 2 (two) times daily.  [provider]  Active   metoCLOPramide (REGLAN) 5 MG tablet  [id number]  Take 2.5 mg by mouth at bedtime as needed for nausea. [provider]  Active   montelukast  (SINGULAIR ) 10 MG tablet [id number]  Take 1 tablet by mouth at bedtime. [provider]  Active   naloxone [facility]) nasal spray 4 mg/0.1 mL [id number]  Place 1 spray into the nose once. Administer 1 spray into affected nostril(s) as needed for opioid reversal. Spray the contents of 1 device into 1 nostril. Call 911. May repeat with 2nd device in alternate nostril if no response in 2-3 minutes. [provider]  Active   JESSE SCHLOSSMAN [id number]  Allergen immunotherapy [provider]  Active Self  olopatadine  (PATANOL) 0.1 % ophthalmic solution [id number]  Apply to eye. [provider]  Active   Omega-3 1000 MG CAPS [id number]  Take by mouth. [provider]  Active   omeprazole  (PRILOSEC) 20 MG capsule [id number]  Take 2 capsules (40 mg total) by mouth daily. Daryl Setter, NP  Active   ondansetron  (ZOFRAN -ODT) 4 MG disintegrating tablet [id number]  DISSOLVE ONE TABLET MOUTH THREE TIMES A DAY AS NEEDED AS DIRECTED BY PROVIDER [provider]  Active   oxycodone  (OXY-IR) 5 MG capsule [id number]  Take 5 mg by mouth every 4 (four) hours as needed. [provider]  Active   pioglitazone (ACTOS) 30 MG tablet [id number]  Take by mouth. [provider]  Active   pramipexole (MIRAPEX) 0.5 MG tablet [id number]  Take 0.5 mg by mouth 3 (three) times daily. [provider]  Active   Propylene Glycol (SYSTANE COMPLETE) 0.6 % SOLN [id number]  Apply 1 drop to eye 4 (four) times daily as needed. [provider]  Active   Semaglutide , 2 MG/DOSE, (OZEMPIC , 2 MG/DOSE,) 8 MG/3ML SOPN [id number]  Inject 2 mg into the skin once a week. Daryl Setter, NP  Active   Simethicone BERNICE [id number]   [provider]  Active   sodium chloride  (OCEAN) 0.65 % nasal spray [id number]   [provider]  Active   tadalafil  (CIALIS) 20 MG tablet [id number]  Take by mouth. [provider]  Active   tamsulosin  (FLOMAX ) 0.4 MG CAPS capsule [id number]  TAKE 1 CAPSULE (0.4 MG TOTAL) BY MOUTH DAILY. Daryl Setter, NP  Active   testosterone cypionate (DEPOTESTOSTERONE CYPIONATE) 200 MG/ML injection [id number]  Inject 200 mg into the muscle every 14 (fourteen) days. [provider]  Active   urea (CARMOL) 10 % cream [id number]  Apply topically as needed. [provider]  Active             Recommendation:   Continue Current Plan of Care  Follow Up Plan:   Telephone follow-up in 1 week  Medford Balboa, BSN, RN [place]  VBCI - [facility] Health RN Care Manager [phone]

## 2024-08-14 NOTE — Progress Notes (Signed)
 Care Guide Pharmacy Note  08/14/2024 Name: [name] MRN: [id number] DOB: [birth year]  Referred By: Daryl Setter, NP Reason for referral: Complex Care Management (Outreach to schedule with Pharm d )   [name] is a 63 y.o. year old male who is a primary care patient of Daryl Setter, NP.  [name] was referred to the pharmacist for assistance related to: polypharmacy   Successful contact was made with the patient to discuss pharmacy services including being ready for the pharmacist to call at least 5 minutes before the scheduled appointment time and to have medication bottles and any blood pressure readings ready for review. The patient agreed to meet with the pharmacist via telephone visit on (date/time).08/21/2024  Jeoffrey Buffalo , RMA     [place]  [facility], [facility] Guide  Direct Dial: [phone]  Website: [email or web address]

## 2024-08-16 ENCOUNTER — Telehealth: Payer: Self-pay

## 2024-08-17 NOTE — Progress Notes (Signed)
 Physical Therapy Progress Note Report- Plan of Care  Payor: [employer] / Plan: [facility] / Product Type: Other /   Demographics:  Age: 63 y.o.  Gender: male  Referring Diagnosis: Painful total knee replacement, subsequent encounter (U15.15KI, Z96.659)  Referring Clinician:  Leah Bryan Foster*  Initial Evaluation Date: 07/25/24  Reporting Period:  07/27/2024 to 08/17/2024 Number of Visits to Date: 8 Number of Missed Visits: No data recorded   Therapy Diagnosis:     ICD-10-CM   1. Painful total knee replacement, subsequent encounter  T84.84XD, Z96.659   2. Decreased range of motion of left knee  M25.662   3. Decreased functional activity tolerance  R68.89   4. Impaired mobility and ADLs  Z74.09, Z78.9   5. Impaired ambulation  R26.2    Rehabilitation Precautions/Restrictions:   Precautions/Restrictions Precautions: Hx of falls, DM, HTN, osteopenia, hx of kidney cancer    Problem List: Activity tolerance; Endurance; Functional limitations; Gait; Mobility; Range of motion; Pain; Strength; Fall risk    SUBJECTIVE Patient reports his knee is feeling quite stiff today, reports he went to the store and grilled out the other day which may have contributed to this. Patient reports he continues to experience sharp posterolateral left knee pain, especially extension after deep knee flexion, and reports he has not been sleeping well. Pain:  Pain Assessment Pain Assessment: 0-10 Pain Score  : 4 Pain Type: Surgical pain Pain Location: Knee Pain Orientation: Left Pain Descriptors: Aching, Dull, Sharp, Sore, Tightness, Discomfort Clinical Progression: Gradually improving  OBJECTIVE  General Observation:  Patient ambulates into clinic using SPC and is in NAD.     L Knee AAROM: 0-95 degrees with strap assistance  L LE Strength Hip Flexion: 4/5 with pain Knee Extension: 5-/5 Knee Flexion: 5/5   Outcomes:  PT Outcome Measures    Flowsheet Row  Value  Lower Extremity Functional Scale (LEFS)   Any of your usual work, housework, or school activities 2  Your usual hobbies, recreational, or sporting activities 0  Getting into or out of the bath 2  Walking between rooms 3  Putting on your shoes or socks 2  Squatting 1  Lifting an object, like a bag of groceries from the floor 4  Performing light activities around your home 3  Performing heavy activities around your home 1  Getting into or out of car 2  Walking 2 blocks 1  Walking a mile 0  Going up or down 10 stairs (about 1 flight of stairs) 0  Standing for 1 hour 0  Sitting for 1 hour 4  Running on even ground 0  Running on uneven ground 0  Making sharp turns while running fast 0  Hopping 0  Rolling over in bed 4  LEFS Raw Score 29 /80  Lower Extremity Functional Scale (LEFS) Score 36.25 /100  LE Functional Disability Score 63.75  KOOS JR-  Stiffness-Amount of joint stiffness experienced during the last week in your knee   How severe is your knee stiffness after first wakening in the morning? 2  KOOS JR - Pain-Amount of knee pain experienced last week during the following actiivities   Twisting/pivoting on your knee 3  Straightening knee fully 1  Going up or down stairs 2  Standing upright 2  KOOS JR - Function/Daily Living-Degree of difficulty experienced in the last week due to your knee (ability to move around and look after yourself)   Rising from sitting 1  Bending to floor/pick up an  object 1  KOOS JR - KOOS Scoring   KOOS Raw Score 12  KOOS Interval Score 57.14   Interventions:  Therapeutic Exercises: Recumbent bike x 6 min warm up for knee flexion ROM Hamstring stretch at 4 step 3 x 30 seconds on L Step ups at 4 step 3 x 10 on L Standing hip abduction 3 x 10 each leg on foam with lime green TB Supine passive left knee flexion stretching with overpressure to patient tolerance Supine heel slides with slide board and strap assist 3 x 10 with 5 holds ROM  measurements taken HEP review and discussion   Ice pack to L knee x 5 min post-treatment     Education: Yes, as described in interventions    ASSESSMENT Patient presents to PT 3 weeks s/p L revision TKA on 07/25/2024, and has made some progress with physical therapy thus far. Patient demonstrates improved functional mobility of right knee, but continues to report increased sharp pain with end-range knee flexion on lateral aspect of left knee. Patient requires frequent cueing to prevent external rotation of left lower extremity during ambulation. Patient had some difficulty ascending 6 steps but was able to perform step ups at the lower steps while requiring cues to prevent knee valgus on the left. Patient would continue to benefit from skilled PT to improve functional mobility and strength to allow full recovery from surgery and return to PLOF.  Therapy Program to Date: In summary, the therapy program has included: Therapeutic Exercises, Therapeutic Activities, Manual Therapy, and Modalities  Responses to Treatment Interventions: Additional improvement anticipated within reasonable timeframe  Progress Towards Goals:   Goals Addressed             This Visit's Progress   . PT Goal       Short Term Goals (STG) - Time Frame 8 visits             STG 1: Patient will be independent and compliant with all HEP     7/31: MET and ongoing  8/21: MET and ongoing              STG 2: Patient will demonstrate L Knee AROM 0-105 degrees in order to allow progression toward LTG.              7/31: Ongoing, L Knee AAROM 0-80 degrees  8/21: Ongoing, L Knee AAROM: 0-95 degrees              STG 3: Patient will report 1/10 or less L Knee pain at rest in order to demonstrate improved tolerance to all therapeutic exercises and ambulation.  7/31: Ongoing, 7/10 pain at rest               8/21: Ongoing, 4/10 pain at rest  Long Term Goals (LTG) - Time Frame 30 visits             LTG 1: Patient will improve  LEFS Disability Score to 25% or less in order to demonstrate statistically significant improvement in patient perceived function.  7/31: Ongoing, 83.75% disability on LEFS 8/21: Ongoing, 63.75% disability on LEFS                         LTG 2: Patient will improve KOOS JR. Interval Score to 70.0 or greater.  7/31: Ongoing, 31.31 on KOOS JR. Interval Score  8/21: Ongoing, 57.14 on KOOS JR. Interval Score  LTG 3: Patient will demonstrate L Knee AROM of 0-120 degrees or greater to allow patient to safely ascend/descend stairs.  7/31: Ongoing, see objective measures 8/21: Ongoing, see objective measures                         LTG 4: Patient will safely ascend and descend 1 flight of stairs without AD and reciprocal gait pattern in order to demonstrate ability to safely navigate stairs at home and in the community.  7/31: Ongoing, not assessed  8/21: Ongoing, not assessed today   LTG 5: Patient will perform 14 or more repetitions on 30 second chair rise test to demonstrate decreased risk of falls.   7/31: Ongoing, not assessed  8/21: Ongoing, not assessed today        PLAN Treatment Frequency and Duration:  PT Frequency: 2x/week PT Duration: Other (comment) (30 visits) Treatment Plan Details: Up to 2x/week for up to 30 visits  Recommended PT Treatment/Interventions: Dry needling (1-2 muscles) (79439); Dry Needling (3+ muscles) (79438); Electrical stimulation-attended (02967); Electrical stimulation-unattended (02985); Manual therapy (97140); Neuromuscular re-education [phone]); Self-care/home management [phone]); Therapeutic activity (97530); Therapeutic exercise (97110); Ultrasound (02964); Vasopneumatic compression (02983)   Recommended Consults:  None currently  Development of Plan of Care:  Patient participated in plan of care development.  Total Treatment Time (Time & Untimed): Total Treatment Time: 38 Total Time in Timed Codes: Time in Timed Codes: 38      Treatment/Procedures Therapeutic Exercises minutes: 38             The patient has been instructed to contact our clinic if any questions or problems should arise.

## 2024-08-18 ENCOUNTER — Other Ambulatory Visit: Payer: Self-pay

## 2024-08-18 ENCOUNTER — Encounter: Payer: Self-pay | Admitting: Student

## 2024-08-18 NOTE — Transitions of Care (Post Inpatient/ED Visit) (Signed)
 Transition of Care week 4  Visit Note  08/18/2024  Name: [name] MRN: [id number]          DOB: 1961/08/14  Situation: Patient enrolled in [facility] 30-day program. Visit completed with Nando Galloway by telephone.   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   Sinus congestion    Sleep apnea    Thyroid  disease     Assessment: Patient Reported Symptoms: Cognitive Cognitive Status: Alert and oriented to person, place, and time      Neurological Neurological Review of Symptoms: No symptoms reported    HEENT HEENT Symptoms Reported: No symptoms reported      Cardiovascular Cardiovascular Symptoms Reported: No symptoms reported    Respiratory Respiratory Symptoms Reported: No symptoms reported    Endocrine Endocrine Symptoms Reported: No symptoms reported Is patient diabetic?: Yes Is patient checking blood sugars at home?: Yes List most recent blood sugar readings, include date and time of day: 136 this am    Gastrointestinal Gastrointestinal Symptoms Reported: Nausea Gastrointestinal Comment: The nausea has improved with the Phenergan . He is taking his Oxycodone  but is not as nauseated    Genitourinary Genitourinary Symptoms Reported: No symptoms reported    Integumentary Integumentary Symptoms Reported: Incision Skin Comment: The incision has scabbed over  Musculoskeletal Musculoskelatal Symptoms Reviewed: Difficulty walking, Limited mobility, Joint  pain Additional Musculoskeletal Details: Total knee revision Musculoskeletal Management Strategies: Routine screening, Medication therapy, Medical device, Adequate rest, Exercise Musculoskeletal Comment: Continues with twice weekly Outpatient therapy      Psychosocial Psychosocial Symptoms Reported: No symptoms reported         There were no vitals filed for this visit.  Medications Reviewed Today     Reviewed by Moises Reusing, RN (Case Manager) on 08/18/24 at 1459  Med List Status: <None>   Medication Order Taking? Sig Documenting Provider Last Dose Status Informant  albuterol  (VENTOLIN  HFA) 108 (90 Base) MCG/ACT inhaler [id number]  Inhale 2 puffs into the lungs every 6 (six) hours as needed for wheezing or shortness of breath. Marinda Rocky SAILOR, MD  Active   aspirin EC 81 MG tablet [id number]  Take 81 mg by mouth daily. Swallow whole. [provider]  Active   atorvastatin (LIPITOR) 80 MG tablet [id number]  Take 1 tablet by mouth daily. [provider]  Active   Calcium Carbonate-Vit D-Min (CALTRATE 600+D PLUS MINERALS) 600-800 MG-UNIT TABS [id number]  One tab twice daily O'Sullivan, Melissa, NP  Active   cephALEXin (KEFLEX) 500 MG capsule [id number]  Take 500 mg by mouth 2 (two) times daily. [provider]  Active   cetirizine (ZYRTEC) 10 MG tablet [id number]  Take 1 tablet by mouth at bedtime. [provider]  Active   Cholecalciferol 50 MCG (2000 UT) TABS [id number]  Take 2 tablets by mouth daily. [provider]  Active   citalopram (CELEXA) 40 MG tablet [id number]  Take 40 mg by mouth daily. [provider]  Active   docusate sodium (COLACE) 100 MG capsule [id number]  Take 100 mg by mouth 2 (two) times daily. [provider]  Active   empagliflozin (JARDIANCE) 25 MG TABS tablet [id number]  Take 25 mg by mouth daily. [provider]  Active   EPINEPHrine  0.3 mg/0.3 mL IJ SOAJ injection [id number]  Inject into the muscle.  [provider]  Active   fexofenadine  (ALLEGRA ) 180 MG tablet [id number]  Take 1 tablet by mouth daily. [provider]  Active   fluticasone  (FLONASE) 50 MCG/ACT nasal spray [id number]  Administer 1 spray in each nostril daily as needed. [provider]  Active   Fluticasone -Salmeterol (ADVAIR DISKUS) 250-50 MCG/DOSE AEPB [id number]  Inhale 1 puff into the lungs 2 (two) times daily. Daryl Setter, NP  Active   gabapentin  (NEURONTIN ) 300 MG capsule [id number]  Take 2 capsules (600 mg total) by mouth 2 (two) times daily. Daryl Setter, NP  Active   Glucagon 0.5 MG/0.1ML EMMANUEL [id number]  Inject into the skin. [provider]  Active   hydrochlorothiazide (HYDRODIURIL) 25 MG tablet [id number]  Take 1 tablet by mouth daily. [provider]  Active   hydrOXYzine (ATARAX) 10 MG tablet [id number]  Take by mouth. [provider]  Active   Insulin  Pen Needle 31G X 5 MM MISC [id number]  Use as directed. O'Sullivan, Melissa, NP  Active   ketotifen (ZADITOR) 0.025 % ophthalmic solution [id number]  Place 1 drop into both eyes 2 (two)  times daily as needed. [provider]  Active   Lactobacillus (ACIDOPHILUS) CAPS capsule [id number]  Take 2 capsules by mouth daily. [provider]  Active   levothyroxine  (SYNTHROID ) 75 MCG tablet [id number]  Take 1 tablet (75 mcg total) by mouth daily. O'Sullivan, Melissa, NP  Active   Lidocaine HCl 4 % LIQD [id number]  Apply 1 application topically daily as needed. [provider]  Active   losartan (COZAAR) 100 MG tablet [id number]  Take 1 tablet by mouth daily. [provider]  Active   metFORMIN (GLUCOPHAGE-XR) 500 MG 24 hr tablet [id number]  Take 1,000 mg by mouth 2 (two) times daily.  [provider]  Active   metoCLOPramide (REGLAN) 5 MG tablet [id number]  Take 2.5 mg by mouth at bedtime as needed for nausea. [provider]  Active   montelukast  (SINGULAIR ) 10 MG  tablet [id number]  Take 1 tablet by mouth at bedtime. [provider]  Active   naloxone [facility]) nasal spray 4 mg/0.1 mL [id number]  Place 1 spray into the nose once. Administer 1 spray into affected nostril(s) as needed for opioid reversal. Spray the contents of 1 device into 1 nostril. Call 911. May repeat with 2nd device in alternate nostril if no response in 2-3 minutes. [provider]  Active   JESSE SCHLOSSMAN [id number]  Allergen immunotherapy [provider]  Active Self  olopatadine  (PATANOL) 0.1 % ophthalmic solution [id number]  Apply to eye. [provider]  Active   Omega-3 1000 MG CAPS [id number]  Take by mouth. [provider]  Active   omeprazole  (PRILOSEC) 20 MG capsule [id number]  Take 2 capsules (40 mg total) by mouth daily. Daryl Setter, NP  Active   ondansetron  (ZOFRAN -ODT) 4 MG disintegrating tablet [id number]  DISSOLVE ONE TABLET MOUTH THREE TIMES A DAY AS NEEDED AS DIRECTED BY PROVIDER  Patient not taking: Reported on 08/18/2024  [provider]  Active   oxycodone  (OXY-IR) 5 MG capsule [id number]  Take 5 mg by mouth every 4 (four) hours as needed. [provider]  Active   pioglitazone (ACTOS) 30 MG tablet [id number]  Take by mouth. [provider]  Active   pramipexole (MIRAPEX) 0.5 MG tablet [id number]  Take 0.5 mg by mouth 3 (three) times daily. [provider]  Active   promethazine  (PHENERGAN ) 25 MG tablet [id number]  Take 25 mg by mouth every 6 (six) hours as needed for nausea or vomiting. [provider]  Active   Propylene Glycol (SYSTANE COMPLETE) 0.6 % SOLN [id number]  Apply 1 drop to eye 4 (four) times daily as needed. [provider]  Active   Semaglutide , 2 MG/DOSE, (OZEMPIC , 2 MG/DOSE,) 8 MG/3ML SOPN [id number]  Inject 2 mg into the skin once a week. O'Sullivan, Melissa, NP  Active   Simethicone BERNICE [id number]   [provider]  Active   sodium chloride  (OCEAN)  0.65 % nasal spray [id number]   [provider]  Active   tadalafil (CIALIS) 20 MG tablet [id number]  Take by mouth. [provider]  Active   tamsulosin  (FLOMAX ) 0.4 MG CAPS capsule [id number]  TAKE 1 CAPSULE (0.4 MG TOTAL) BY MOUTH DAILY. Daryl Setter, NP  Active   testosterone cypionate (DEPOTESTOSTERONE CYPIONATE) 200 MG/ML injection [id number]  Inject 200 mg into the muscle every 14 (fourteen) days. [provider]  Active   urea (CARMOL) 10 % cream [id number]  Apply topically as needed. [provider]  Active             Recommendation:   Continue Current Plan of Care  Follow Up Plan:   Telephone follow-up in 1 week  Medford Balboa, BSN, RN [place]  VBCI - [facility] RN Care Manager [phone]

## 2024-08-18 NOTE — Patient Instructions (Signed)
 Visit Information  Thank you for taking time to visit with me today. Please don't hesitate to contact me if I can be of assistance to you before our next scheduled telephone appointment.  Our next appointment is by telephone on Friday September 5th at 3:00pm  Following is a copy of your care plan:   Goals Addressed             This Visit's Progress    VBCI Transitions of Care (TOC) Care Plan       Problems: (reviewed 08/18/24) Recent Hospitalization for treatment of Total Knee Revision, Nausea, Vomiting   Goal: (reviewed 08/18/24) Over the next 30 days, the patient will not experience hospital readmission  Interventions: (reviewed 08/18/24)  Surgery (07/26/24): Evaluation of current treatment plan related to Total Knee Revision surgery assessed patient/caregiver understanding of surgical procedure   reviewed post-operative instructions with patient/caregiver addressed questions about post - surgical incision care  reviewed medications with patient and addressed questions reviewed scheduled provider appointments with patient   09/04/24 with the surgeon Outpatient PT at the [facility] twice a week Pharmacy referral for Polypharmacy Education regarding Nausea, vomiting and pain medication  Patient Self Care Activities: (reviewed 08/18/24) Attend all scheduled provider appointments Call pharmacy for medication refills 3-7 days in advance of running out of medications Call provider office for new concerns or questions  Notify RN Care Manager of [facility] call rescheduling needs Participate in Transition of Care Program/Attend [facility] scheduled calls Perform all self care activities independently  Take medications as prescribed   PCP Follow Up - 08/07/24 - Completed. Started on Ozempic  8mg /26ml - Take 2mg    Plan:  Telephone follow up appointment with care management team member scheduled for:  September 5th at 3:00pm        Patient verbalizes understanding of instructions and care plan provided  today and agrees to view in MyChart. Active MyChart status and patient understanding of how to access instructions and care plan via MyChart confirmed with patient.     The patient has been provided with contact information for the care management team and has been advised to call with any health related questions or concerns.   Please call the care guide team at [phone] if you need to cancel or reschedule your appointment.   Please call the Suicide and Crisis Lifeline: 988 call the [place]  National Suicide Prevention Lifeline: (509)848-4894 or TTY: 905-281-8063 TTY (431)389-9738) to talk to a trained counselor if you are experiencing a Mental Health or Behavioral Health Crisis or need someone to talk to.  Medford Balboa, BSN, RN [place]  VBCI - [facility] RN Care Manager [phone]

## 2024-08-21 ENCOUNTER — Other Ambulatory Visit (INDEPENDENT_AMBULATORY_CARE_PROVIDER_SITE_OTHER): Admitting: Pharmacist

## 2024-08-21 ENCOUNTER — Encounter: Payer: Self-pay | Admitting: Family

## 2024-08-21 DIAGNOSIS — E1149 Type 2 diabetes mellitus with other diabetic neurological complication: Secondary | ICD-10-CM

## 2024-08-21 DIAGNOSIS — E785 Hyperlipidemia, unspecified: Secondary | ICD-10-CM

## 2024-08-21 NOTE — Progress Notes (Signed)
 08/21/2024 Name: [name] MRN: [id number] DOB: 05/20/1961  Chief Complaint  Patient presents with   Medication Management   Diabetes    [name] is a 63 y.o. year old male who presented for a telephone visit.   They were referred to the pharmacist by their Case Management Team  for assistance in managing diabetes and complex medication management.    Subjective:  Care Team: Primary Care Provider: Daryl Setter, NP ; Next Scheduled Visit: 10/10/2024 and physical 12/22/2024 Sees the following specialists at the [facility] - endocrinologist, urologist, pulmonologist and gastroenterologist.   Medication Access/Adherence  Current Pharmacy:  [facility] [place] [facility] [address] [place] [address] Phone: [phone] Fax: [phone]  [facility] - [place], [place] - [address] [place] [address] Phone: [phone] Fax: [phone]  [facility] [place], [place] - [address] [place] [address] Phone: [phone] Fax: [phone]  [facility] - [place], [place] - [address] [place] [address] Phone: [phone] Fax: [phone]  [id number] - [place], [place] - [address] [facility] [address] [place] [address] Phone: [phone] Fax: [phone]   Patient reports affordability concerns with their medications: No  Patient reports access/transportation concerns to their pharmacy: No  Patient reports adherence concerns with their medications:  No      Diabetes: Managed by endocrinologist at [facility].  Current medications: Ozempic  2mg  weekly, metformin 1000mg  twice a day per med list but per patient has been changed to 2 tabs once a day at the [place], Jardiance 25mg   and pioglitazone 30mg  daily   Medications tried in the past: Lantus   Diabetes related complications: gastroparesis, neuropathy, retinopathy  Patient has been having some nausea which is reports sometimes occurs after meals and sometimes at physical therapy / related to pain. He has taken metoclopramide in past for gastroparesis but he did not see improvement. He is currently taking promethazine  12.5mg  as needed for nausea and states he has helped a lot.   Wt Readings from Last 3 Encounters:  08/09/24 199 lb (90.3 kg)  08/04/24 201 lb (91.2 kg)  06/20/24 211 lb (95.7 kg)    Current glucose readings: 118 to 150 testing 1 or 2 times daily   Hyperlipidemia/ASCVD Risk Reduction  Current lipid lowering medications: atorvastatin 80mg  daily Medications tried in the past:   Antiplatelet regimen: aspirin 81mg  daily (taking 2 tabs for about 1 more week due to recent surgery)    Objective:  Lab Results  Component Value Date   HGBA1C 7.3 (H) 06/20/2024    Lab Results  Component Value Date   CREATININE 1.17 01/24/2024   BUN 19 01/24/2024   NA 136 01/24/2024   K 3.6 01/24/2024   CL 101 01/24/2024   CO2 25 01/24/2024    Lab Results  Component Value Date   CHOL 148 01/24/2024   HDL 41.00 01/24/2024   LDLCALC 79 01/24/2024   TRIG 141.0 01/24/2024   CHOLHDL 4 01/24/2024    Medications Reviewed Today     Reviewed by Carla Milling, RPH-CPP (Pharmacist) on 08/21/24 at 1419  Med List Status: <None>   Medication Order Taking? Sig Documenting Provider Last Dose Status Informant  albuterol  (VENTOLIN  HFA) 108 (90 Base) MCG/ACT inhaler [id number] Yes  Inhale 2 puffs into the lungs every 6 (six) hours as needed for wheezing or shortness of breath. Marinda Rocky SAILOR, MD  Active   aspirin EC 81 MG tablet [id number] Yes Take 81 mg by mouth daily. Swallow whole.  Patient taking differently: Take 162 mg by mouth daily. For 1 more week, then change back to 1 tablet daily   [provider]   Active   atorvastatin (LIPITOR) 80 MG tablet [id number] Yes Take 1 tablet by mouth daily. [provider]  Active   Calcium Carbonate-Vit D-Min (CALTRATE 600+D PLUS MINERALS) 600-800 MG-UNIT TABS [id number]  One tab twice daily  Patient not taking: Reported on 08/21/2024   Daryl Setter, NP  Active     Discontinued 08/21/24 1411 (Completed Course)   cetirizine (ZYRTEC) 10 MG tablet [id number] Yes Take 1 tablet by mouth at bedtime. [provider]  Active   Cholecalciferol 50 MCG (2000 UT) TABS [id number] Yes Take 2 tablets by mouth daily. [provider]  Active   citalopram (CELEXA) 40 MG tablet [id number] Yes Take 40 mg by mouth daily. [provider]  Active    Patient not taking:   Discontinued 08/21/24 1411 (Completed Course)   empagliflozin (JARDIANCE) 25 MG TABS tablet [id number] Yes Take 25 mg by mouth daily. [provider]  Active   EPINEPHrine  0.3 mg/0.3 mL IJ SOAJ injection [id number] Yes Inject into the muscle.  Patient taking differently: Inject 0.3 mg into the muscle as needed.   [provider]  Active   fexofenadine  (ALLEGRA ) 180 MG tablet [id number] Yes Take 1 tablet by mouth daily. [provider]  Active   fluticasone  (FLONASE) 50 MCG/ACT nasal spray [id number]  Administer 1 spray in each nostril daily as needed. [provider]  Active     Discontinued 08/21/24 1412 (Dose change)   fluticasone -salmeterol (ADVAIR) 500-50 MCG/ACT AEPB [id number] Yes Inhale 1 puff into the lungs in the morning and at bedtime. [provider]  Active   gabapentin  (NEURONTIN ) 300 MG capsule [id number] Yes Take 2 capsules (600 mg total) by mouth 2 (two) times daily.  Patient taking differently: Take 600 mg by mouth 2 (two) times daily. And 2 capsules at bedtime   O'Sullivan, Melissa, NP  Active   Glucagon 0.5 MG/0.1ML EMMANUEL [id number] Yes Inject into the skin. [provider]  Active   hydrochlorothiazide  (HYDRODIURIL) 25 MG tablet [id number] Yes Take 1 tablet by mouth daily. [provider]  Active   hydrOXYzine (ATARAX) 10 MG tablet [id number]  Take by mouth. [provider]  Active   Insulin  Pen Needle 31G X 5 MM MISC [id number] Yes Use as directed. Daryl Setter, NP  Active    Patient not taking:   Discontinued 08/21/24 1414 (Completed Course)   Lactobacillus (ACIDOPHILUS) CAPS capsule [id number] Yes Take 2 capsules by mouth daily. [provider]  Active   levothyroxine  (SYNTHROID ) 75 MCG tablet [id number] Yes Take 1 tablet (75 mcg total) by mouth daily. Daryl Setter, NP  Active    Patient not taking:   Discontinued 08/21/24 1415 (Completed Course)   losartan (COZAAR) 100 MG tablet [id number] Yes Take 1 tablet by mouth daily. [provider]  Active   Patient taking differently:   Discontinued 08/21/24 1405 (Change in therapy)   metFORMIN (GLUCOPHAGE-XR) 500 MG 24 hr tablet [id number] Yes Take 1,000 mg by mouth daily with breakfast. [provider]  Active   metoCLOPramide (REGLAN) 5 MG tablet [id number]  Take 2.5 mg by mouth  at bedtime as needed for nausea.  Patient not taking: Reported on 08/21/2024   [provider]  Active   montelukast  (SINGULAIR ) 10 MG tablet [id number] Yes Take 1 tablet by mouth at bedtime. [provider]  Active   Multiple Vitamins-Minerals (MENS 50+ MULTI VITAMIN/MIN PO) [id number] Yes Take 1 tablet by mouth daily. [provider]  Active    Patient not taking:   Discontinued 08/21/24 1415 (No longer needed (for PRN medications))   NON FORMULARY [id number] Yes Allergen immunotherapy [provider]  Active Self  olopatadine  (PATANOL) 0.1 % ophthalmic solution [id number] Yes Apply to eye. [provider]  Active   Omega-3 1000 MG CAPS [id number] Yes Take by mouth.  Patient taking differently: Take 1 capsule by mouth daily.   [provider]  Active   omeprazole   (PRILOSEC) 20 MG capsule [id number] Yes Take 2 capsules (40 mg total) by mouth daily. O'Sullivan, Melissa, NP  Active   ondansetron  (ZOFRAN -ODT) 4 MG disintegrating tablet [id number]  DISSOLVE ONE TABLET MOUTH THREE TIMES A DAY AS NEEDED AS DIRECTED BY PROVIDER  Patient not taking: Reported on 08/21/2024   [provider]  Active   oxycodone  (OXY-IR) 5 MG capsule [id number] Yes Take 5 mg by mouth every 4 (four) hours as needed. [provider]  Active   pioglitazone (ACTOS) 30 MG tablet [id number] Yes Take by mouth. [provider]  Active   pramipexole (MIRAPEX) 0.5 MG tablet [id number] Yes Take 0.5 mg by mouth 3 (three) times daily. [provider]  Active   promethazine  (PHENERGAN ) 25 MG tablet [id number] Yes Take 25 mg by mouth every 6 (six) hours as needed for nausea or vomiting. [provider]  Active   Propylene Glycol (SYSTANE COMPLETE) 0.6 % SOLN [id number] Yes Apply 1 drop to eye 4 (four) times daily as needed. [provider]  Active   Semaglutide , 2 MG/DOSE, (OZEMPIC , 2 MG/DOSE,) 8 MG/3ML SOPN [id number] Yes Inject 2 mg into the skin once a week. O'Sullivan, Melissa, NP  Active   Simethicone BERNICE [id number]    Patient not taking: Reported on 08/21/2024   [provider]  Active   sodium chloride  (OCEAN) 0.65 % nasal spray [id number] Yes  [provider]  Active   tadalafil (CIALIS) 20 MG tablet [id number] Yes Take by mouth. [provider]  Active   tamsulosin  (FLOMAX ) 0.4 MG CAPS capsule [id number] Yes TAKE 1 CAPSULE (0.4 MG TOTAL) BY MOUTH DAILY. Daryl Setter, NP  Active   testosterone cypionate (DEPOTESTOSTERONE CYPIONATE) 200 MG/ML injection [id number] Yes Inject 200 mg into the muscle every 14 (fourteen) days. [provider]  Active    Patient not taking:   Discontinued 08/21/24 1418 (No longer needed (for PRN medications))               Assessment/Plan:   Diabetes: Last A1c at [facility] was  lowest he has had in awhile - was 6.9% - Discussed that Ozempic  and other incretin mimetic medications slow gastric emptying and could be worsening nausea and gastroparesis symptoms. Recommended he discuss with this endocrinologist about lowering dose of Ozempic , taking a break to see if nausea improves or could try low dose Moujaro to see if nausea improves.  - Continue metformin ER 500mg  - take 2 tablets daily. Updated our medication records after reviewing list from [place].  - Continue pioglitazone and Jardiance - Recommend to check glucose 1 to 2 times per day.   Hyperlipidemia/ASCVD Risk Reduction: Last LDL was 79 -  Recommend to continue atorvastatin 80mg  daily   - If next LDL > 70 consider adding ezetimibe 10mg  daily.   Medication Management: - Reviewed medication list in EPIC and compared to list from [place]. Updated our list.  - Discussed medications, reasons for taking and potential side effects.    Health Maintenance - Patient had diabetic eye exam at the [facility] 03/2024 - provided copy of eye exam to Luke Callas, RN to abstract results to chart.  Follow Up Plan: patient will continue following with nurse care manager. Will contact Clinical Pharmacist Practitioner as needed for future medication related concerns.   Madelin Ray, PharmD Clinical Pharmacist [facility]

## 2024-08-23 NOTE — Progress Notes (Signed)
 Physical Therapy Visit - Daily Note   Payor: [employer] / Plan: [facility] / Product Type: Other /   Visit Count: 10  Rehabilitation Precautions/Restrictions:   Precautions/Restrictions Precautions: Hx of falls, DM, HTN, osteopenia, hx of kidney cancer       Referring Diagnosis: T84.84XD, Z96.659 - Painful total knee replacement, subsequent encounter   SUBJECTIVE Patient Report:   Patient reports his knee is doing well today with moderate stiffness.   Pain:  Pain Assessment Pain Assessment: 0-10 Pain Score  : 4 Pain Location: Knee Pain Orientation: Left Clinical Progression: Gradually improving  OBJECTIVE  General Observation/Objective Measures: Patient ambulates into clinic independently and is in NAD.    Interventions:  Therapeutic Exercises: Recumbent bike rocks x 6 min warm up for knee flexion ROM Hamstring stretch at 4 step 4 x 30 seconds on L Slant board calf stretch 3 x 30 seconds on L Step ups on 6 step 3 x 10 on L Standing hip abduction on foam 3 x 10 each leg with blue TB SL Hamstring curls 15# 3 x 12 on L Sit to stand from 20 box 3 x 12   Ice pack to L knee x 5 min post-treatment  Education: Yes, as described in interventions   ASSESSMENT Patient continues to make gradual progress with functional LE strengthening exercises, was able to increase resistance on hamstring curls and performed sit to stands with less difficulty, but continues to require frequent cueing to prevent ER of L LE during ambulation and squats. Patient remains a candidate for PT to improve functional mobility and strength to allow full recovery from surgery and return to PLOF activities.    Therapy Diagnosis:     ICD-10-CM   1. Painful total knee replacement, subsequent encounter  T84.84XD, Z96.659   2. Decreased range of motion of left knee  M25.662   3. Decreased functional activity tolerance  R68.89   4. Impaired mobility and ADLs  Z74.09, Z78.9    5. Impaired ambulation  R26.2     Progress Towards Goals:     Goals Addressed             This Visit's Progress   . PT Goal       Short Term Goals (STG) - Time Frame 8 visits             STG 1: Patient will be independent and compliant with all HEP     7/31: MET and ongoing  8/21: MET and ongoing              STG 2: Patient will demonstrate L Knee AROM 0-105 degrees in order to allow progression toward LTG.              7/31: Ongoing, L Knee AAROM 0-80 degrees  8/21: Ongoing, L Knee AAROM: 0-95 degrees              STG 3: Patient will report 1/10 or less L Knee pain at rest in order to demonstrate improved tolerance to all therapeutic exercises and ambulation.  7/31: Ongoing, 7/10 pain at rest               8/21: Ongoing, 4/10 pain at rest  Long Term Goals (LTG) - Time Frame 30 visits             LTG 1: Patient will improve LEFS Disability Score to 25% or less in order to demonstrate statistically significant improvement in patient perceived function.  7/31: Ongoing, 83.75% disability on LEFS 8/21: Ongoing, 63.75% disability on LEFS                         LTG 2: Patient will improve KOOS JR. Interval Score to 70.0 or greater.  7/31: Ongoing, 31.31 on KOOS JR. Interval Score  8/21: Ongoing, 57.14 on KOOS JR. Interval Score              LTG 3: Patient will demonstrate L Knee AROM of 0-120 degrees or greater to allow patient to safely ascend/descend stairs.  7/31: Ongoing, see objective measures 8/21: Ongoing, see objective measures                         LTG 4: Patient will safely ascend and descend 1 flight of stairs without AD and reciprocal gait pattern in order to demonstrate ability to safely navigate stairs at home and in the community.  7/31: Ongoing, not assessed  8/21: Ongoing, not assessed today   LTG 5: Patient will perform 14 or more repetitions on 30 second chair rise test to demonstrate decreased risk of falls.   7/31: Ongoing, not assessed  8/21: Ongoing,  not assessed today         PLAN Treatment Frequency and Duration:  PT Frequency: 2x/week PT Duration: Other (comment) (30 visits) Treatment Plan Details: Up to 2x/week for up to 30 visits  Recommended PT Treatment/Interventions: Dry needling (1-2 muscles) (79439); Dry Needling (3+ muscles) (79438); Electrical stimulation-attended (02967); Electrical stimulation-unattended (02985); Manual therapy (97140); Neuromuscular re-education [phone]); Self-care/home management [phone]); Therapeutic activity (97530); Therapeutic exercise (97110); Ultrasound (02964); Vasopneumatic compression (02983)   Recommended Consults:  None currently  Development of Plan of Care:  No change in POC.  Total Treatment Time (Time & Untimed): Total Treatment Time: 38 Total Time in Timed Codes: Time in Timed Codes: 38     Treatment/Procedures Therapeutic Exercises minutes: 38             The patient has been instructed to contact our clinic if any questions or problems should arise.

## 2024-08-30 NOTE — Progress Notes (Signed)
 Physical Therapy Visit - Daily Note   Payor: [employer] / Plan: [facility] / Product Type: Other /   Visit Count: 11  Rehabilitation Precautions/Restrictions:   Precautions/Restrictions Precautions: Hx of falls, DM, HTN, osteopenia, hx of kidney cancer        Referring Diagnosis: T84.84XD, Z96.659 - Painful total knee replacement, subsequent encounter   SUBJECTIVE Patient Report:   Patient reports he was up on his feet all weekend and his knee is quite stiff as a result.   Pain:  Pain Assessment Pain Assessment: 0-10 Pain Score  : 6 Pain Location: Knee Pain Orientation: Left Clinical Progression: Gradually improving  OBJECTIVE  General Observation/Objective Measures: Patient ambulates into clinic using SPC and is in NAD.  L Knee flexion AROM: 91 degrees  Interventions:  Therapeutic Exercises: Recumbent bike rocks x 6 min warm up for knee flexion ROM Hamstring stretch at 4 step 3 x 30 seconds on L Slant board calf stretch 3 x 30 seconds on L Step ups on 6 step 3 x 10 on L with single UE support Sit to stand from 19 bench x 10, 2 x 10 with foam pad under R LE Supine SLR 3 x 15 on L  Manual Therapy: IASTM to L knee incision scar using graston tool to improve scar tissue mobility.  Medial/lateral L patella mobilizations  Ice pack to L knee x 5 min post-treatment   Education: Yes, as described in interventions   ASSESSMENT Patient continues to make gradual progress with functional LE strengthening exercises, demonstrates improved mechanics during closed-chain knee flexion exercises with improved ability to minimize valgus on the left knee. Patient tolerated IASTM to L knee incision scar but continues to be limited with knee flexion secondary to tightness and pain. Patient remains a candidate for PT to improve functional LE strength and stability to allow full recovery from surgery and return to PLOF activities.    Therapy Diagnosis:      ICD-10-CM   1. Painful total knee replacement, subsequent encounter  T84.84XD, Z96.659   2. Decreased range of motion of left knee  M25.662   3. Decreased functional activity tolerance  R68.89   4. Impaired mobility and ADLs  Z74.09, Z78.9   5. Impaired ambulation  R26.2     Progress Towards Goals:     Goals Addressed             This Visit's Progress   . PT Goal       Short Term Goals (STG) - Time Frame 8 visits             STG 1: Patient will be independent and compliant with all HEP     7/31: MET and ongoing  8/21: MET and ongoing              STG 2: Patient will demonstrate L Knee AROM 0-105 degrees in order to allow progression toward LTG.              7/31: Ongoing, L Knee AAROM 0-80 degrees  8/21: Ongoing, L Knee AAROM: 0-95 degrees              STG 3: Patient will report 1/10 or less L Knee pain at rest in order to demonstrate improved tolerance to all therapeutic exercises and ambulation.  7/31: Ongoing, 7/10 pain at rest               8/21: Ongoing, 4/10 pain at rest  Long Term Goals (  LTG) - Time Frame 30 visits             LTG 1: Patient will improve LEFS Disability Score to 25% or less in order to demonstrate statistically significant improvement in patient perceived function.  7/31: Ongoing, 83.75% disability on LEFS 8/21: Ongoing, 63.75% disability on LEFS                         LTG 2: Patient will improve KOOS JR. Interval Score to 70.0 or greater.  7/31: Ongoing, 31.31 on KOOS JR. Interval Score  8/21: Ongoing, 57.14 on KOOS JR. Interval Score              LTG 3: Patient will demonstrate L Knee AROM of 0-120 degrees or greater to allow patient to safely ascend/descend stairs.  7/31: Ongoing, see objective measures 8/21: Ongoing, see objective measures                         LTG 4: Patient will safely ascend and descend 1 flight of stairs without AD and reciprocal gait pattern in order to demonstrate ability to safely navigate stairs at home and in the  community.  7/31: Ongoing, not assessed  8/21: Ongoing, not assessed today   LTG 5: Patient will perform 14 or more repetitions on 30 second chair rise test to demonstrate decreased risk of falls.   7/31: Ongoing, not assessed  8/21: Ongoing, not assessed today         PLAN Treatment Frequency and Duration:  PT Frequency: 2x/week PT Duration: Other (comment) (30 visits) Treatment Plan Details: Up to 2x/week for up to 30 visits  Recommended PT Treatment/Interventions: Dry needling (1-2 muscles) (79439); Dry Needling (3+ muscles) (79438); Electrical stimulation-attended (02967); Electrical stimulation-unattended (02985); Manual therapy (97140); Neuromuscular re-education [phone]); Self-care/home management [phone]); Therapeutic activity (97530); Therapeutic exercise (97110); Ultrasound (02964); Vasopneumatic compression (02983)   Recommended Consults:  None currently  Development of Plan of Care:  No change in POC.  Total Treatment Time (Time & Untimed): Total Treatment Time: 38 Total Time in Timed Codes: Time in Timed Codes: 38     Treatment/Procedures Manual Therapy Tech, 1 + regions minutes: 13 Therapeutic Exercises minutes: 25             The patient has been instructed to contact our clinic if any questions or problems should arise.

## 2024-09-01 ENCOUNTER — Telehealth: Payer: Self-pay

## 2024-09-01 NOTE — Progress Notes (Signed)
 Physical Therapy Visit - Daily Note   Payor: [employer] / Plan: [facility] / Product Type: Other /   Visit Count: 12  Rehabilitation Precautions/Restrictions:   Precautions/Restrictions Precautions: Hx of falls, DM, HTN, osteopenia, hx of kidney cancer       Referring Diagnosis: T84.84XD, Z96.659 - Painful total knee replacement, subsequent encounter   SUBJECTIVE Patient Report:   Patient reports his knee was quite sore after last session, but reports the stiffness improved following scar tissue massage.   Pain:  Pain Assessment Pain Assessment: 0-10 Pain Score  : 4 Pain Location: Knee Pain Orientation: Left Clinical Progression: Gradually improving  OBJECTIVE  General Observation/Objective Measures: Patient ambulates into clinic using SPC and is in NAD.   L Knee flexion AROM: 95 degrees following manual therapy.   Interventions:  Therapeutic Exercises: Recumbent bike rocks x 6 min warm up for knee flexion ROM Hamstring stretch at 4 step 3 x 30 seconds on L Slant board calf stretch 3 x 30 seconds on L Supine SLR 3 x 15 on L Sidelying hip abduction 3 x 12 on L Step ups on 6 step 3 x 10 on L with single UE support Sit to stand from 19 bench 3 x 15 with foam pad under R LE, holding 5kg ball  Manual Therapy: IASTM to L knee incision scar using graston tool to improve scar tissue mobility.  Medial/lateral L patella mobilizations   Ice pack to L knee x 5 min post-treatment  Education: Yes, as described in interventions   ASSESSMENT Patient continues to make gradual progress with functional LE strengthening exercises, but continues to have difficulty with multiple sets of straight leg raises and hip abduction secondary to quadriceps and gluteus medius weakness respectively. Patient continues to respond well to scar tissue massage using graston tool and reports improved tightness following this intervention. Patient remains a candidate for PT  to improve functional mobility and strength to allow full recovery from surgery and return to PLOF activities.    Therapy Diagnosis:     ICD-10-CM   1. Painful total knee replacement, subsequent encounter  T84.84XD, Z96.659   2. Decreased range of motion of left knee  M25.662   3. Decreased functional activity tolerance  R68.89   4. Impaired mobility and ADLs  Z74.09, Z78.9   5. Impaired ambulation  R26.2     Progress Towards Goals:     Goals Addressed             This Visit's Progress   . PT Goal       Short Term Goals (STG) - Time Frame 8 visits             STG 1: Patient will be independent and compliant with all HEP     7/31: MET and ongoing  8/21: MET and ongoing              STG 2: Patient will demonstrate L Knee AROM 0-105 degrees in order to allow progression toward LTG.              7/31: Ongoing, L Knee AAROM 0-80 degrees  8/21: Ongoing, L Knee AAROM: 0-95 degrees              STG 3: Patient will report 1/10 or less L Knee pain at rest in order to demonstrate improved tolerance to all therapeutic exercises and ambulation.  7/31: Ongoing, 7/10 pain at rest  8/21: Ongoing, 4/10 pain at rest  Long Term Goals (LTG) - Time Frame 30 visits             LTG 1: Patient will improve LEFS Disability Score to 25% or less in order to demonstrate statistically significant improvement in patient perceived function.  7/31: Ongoing, 83.75% disability on LEFS 8/21: Ongoing, 63.75% disability on LEFS                         LTG 2: Patient will improve KOOS JR. Interval Score to 70.0 or greater.  7/31: Ongoing, 31.31 on KOOS JR. Interval Score  8/21: Ongoing, 57.14 on KOOS JR. Interval Score              LTG 3: Patient will demonstrate L Knee AROM of 0-120 degrees or greater to allow patient to safely ascend/descend stairs.  7/31: Ongoing, see objective measures 8/21: Ongoing, see objective measures                         LTG 4: Patient will safely ascend and  descend 1 flight of stairs without AD and reciprocal gait pattern in order to demonstrate ability to safely navigate stairs at home and in the community.  7/31: Ongoing, not assessed  8/21: Ongoing, not assessed today   LTG 5: Patient will perform 14 or more repetitions on 30 second chair rise test to demonstrate decreased risk of falls.   7/31: Ongoing, not assessed  8/21: Ongoing, not assessed today         PLAN Treatment Frequency and Duration:  PT Frequency: 2x/week PT Duration: Other (comment) (30 visits) Treatment Plan Details: Up to 2x/week for up to 30 visits  Recommended PT Treatment/Interventions: Dry needling (1-2 muscles) (79439); Dry Needling (3+ muscles) (79438); Electrical stimulation-attended (02967); Electrical stimulation-unattended (02985); Manual therapy (97140); Neuromuscular re-education [phone]); Self-care/home management [phone]); Therapeutic activity (97530); Therapeutic exercise (97110); Ultrasound (02964); Vasopneumatic compression (02983)   Recommended Consults:  None currently  Development of Plan of Care:  No change in POC.  Total Treatment Time (Time & Untimed): Total Treatment Time: 38 Total Time in Timed Codes: Time in Timed Codes: 38     Treatment/Procedures Manual Therapy Tech, 1 + regions minutes: 13 Therapeutic Exercises minutes: 25             The patient has been instructed to contact our clinic if any questions or problems should arise.

## 2024-10-10 ENCOUNTER — Ambulatory Visit: Admitting: Family

## 2024-10-10 VITALS — BP 102/74 | HR 95 | Temp 98.4°F | Resp 16 | Ht 64.0 in | Wt 207.0 lb

## 2024-10-10 DIAGNOSIS — Z23 Encounter for immunization: Secondary | ICD-10-CM | POA: Diagnosis not present

## 2024-10-10 DIAGNOSIS — I10 Essential (primary) hypertension: Secondary | ICD-10-CM

## 2024-10-10 DIAGNOSIS — M8580 Other specified disorders of bone density and structure, unspecified site: Secondary | ICD-10-CM

## 2024-10-10 DIAGNOSIS — E7800 Pure hypercholesterolemia, unspecified: Secondary | ICD-10-CM

## 2024-10-10 DIAGNOSIS — H1010 Acute atopic conjunctivitis, unspecified eye: Secondary | ICD-10-CM

## 2024-10-10 DIAGNOSIS — F419 Anxiety disorder, unspecified: Secondary | ICD-10-CM | POA: Diagnosis not present

## 2024-10-10 DIAGNOSIS — E1122 Type 2 diabetes mellitus with diabetic chronic kidney disease: Secondary | ICD-10-CM | POA: Insufficient documentation

## 2024-10-10 DIAGNOSIS — E039 Hypothyroidism, unspecified: Secondary | ICD-10-CM | POA: Diagnosis not present

## 2024-10-10 DIAGNOSIS — G2581 Restless legs syndrome: Secondary | ICD-10-CM | POA: Diagnosis not present

## 2024-10-10 DIAGNOSIS — J4540 Moderate persistent asthma, uncomplicated: Secondary | ICD-10-CM

## 2024-10-10 DIAGNOSIS — J302 Other seasonal allergic rhinitis: Secondary | ICD-10-CM

## 2024-10-10 DIAGNOSIS — F32A Depression, unspecified: Secondary | ICD-10-CM

## 2024-10-10 DIAGNOSIS — J3089 Other allergic rhinitis: Secondary | ICD-10-CM

## 2024-10-10 DIAGNOSIS — E1149 Type 2 diabetes mellitus with other diabetic neurological complication: Secondary | ICD-10-CM | POA: Diagnosis not present

## 2024-10-10 DIAGNOSIS — G4700 Insomnia, unspecified: Secondary | ICD-10-CM | POA: Diagnosis not present

## 2024-10-10 DIAGNOSIS — E11311 Type 2 diabetes mellitus with unspecified diabetic retinopathy with macular edema: Secondary | ICD-10-CM | POA: Insufficient documentation

## 2024-10-10 DIAGNOSIS — G4733 Obstructive sleep apnea (adult) (pediatric): Secondary | ICD-10-CM

## 2024-10-10 DIAGNOSIS — Z96652 Presence of left artificial knee joint: Secondary | ICD-10-CM

## 2024-10-10 DIAGNOSIS — Z905 Acquired absence of kidney: Secondary | ICD-10-CM

## 2024-10-10 NOTE — Assessment & Plan Note (Signed)
Stable on mirapex.  

## 2024-10-10 NOTE — Assessment & Plan Note (Signed)
 BP Readings from Last 3 Encounters:  10/10/24 102/74  08/04/24 135/76  06/20/24 120/79   BP stable on hydrochlorothiazide and losartan.

## 2024-10-10 NOTE — Assessment & Plan Note (Signed)
 Stable- has not needed albuterol  since 5/25.

## 2024-10-10 NOTE — Assessment & Plan Note (Signed)
 Lab Results  Component Value Date   CREATININE 1.17 01/24/2024   Last Cr is normal.

## 2024-10-10 NOTE — Assessment & Plan Note (Signed)
 This is being followed by ophthalmology at the [facility].

## 2024-10-10 NOTE — Assessment & Plan Note (Signed)
 05/08/24- osteopenia dexa performed at the [facility].

## 2024-10-10 NOTE — Assessment & Plan Note (Signed)
 Having increased anxiety.  Continues citalopram.  Recommended hydroxyzine prn.  He is not working with a Paramedic.  Recommended he consider through the [facility].

## 2024-10-10 NOTE — Assessment & Plan Note (Signed)
 Will follow up with his ortho- continuing PT of his left knee.

## 2024-10-10 NOTE — Assessment & Plan Note (Signed)
 Lab Results  Component Value Date   TSH 1.33 06/20/2024   Update synthroid .

## 2024-10-10 NOTE — Assessment & Plan Note (Addendum)
 Notes increased allergies recently due to seasonal triggers.  Using zyrtec/allegra , singulair , patanol, prn flonase.

## 2024-10-10 NOTE — Assessment & Plan Note (Addendum)
 Fair control on citalopram.  Recommended that he start counseling at the [facility].

## 2024-10-10 NOTE — Assessment & Plan Note (Addendum)
 Lab Results  Component Value Date   HGBA1C 7.3 (H) 06/20/2024   HGBA1C 9.7 (H) 01/24/2024   HGBA1C 8.1 (H) 02/14/2019   Lab Results  Component Value Date   LDLCALC 79 01/24/2024   CREATININE 1.17 01/24/2024   Update A1C.  Maintained on jardiance, actos, metformin and Ozempic .

## 2024-10-10 NOTE — Patient Instructions (Signed)
 VISIT SUMMARY:  Today, we discussed your ongoing knee pain and instability following your knee replacement surgery, as well as your diabetes management, depression, anxiety, insomnia, sleep apnea, restless legs syndrome, asthma, allergies, and osteopenia. We also reviewed your general health maintenance, including vaccinations.  YOUR PLAN:  KNEE PAIN AND INSTABILITY: You have persistent knee pain and instability after your knee replacement surgery, with decreased knee flexion and a catching sensation. -We will refer you to an orthopedic surgeon for further evaluation and management.  TYPE 2 DIABETES MELLITUS WITH DIABETIC RETINOPATHY: Your blood sugar levels have been fluctuating, and your recent A1c was 6.9%. You also have worsening diabetic retinopathy. -We will order an A1c test, a kidney function test, and a urine test. -It is important to control your blood sugar to manage your retinopathy. -You have an upcoming endocrinology appointment at the [facility].  DEPRESSION AND ANXIETY: You are experiencing anxiety and depressive symptoms despite taking citalopram. -We will consider referring you to a therapist at the [facility] for further evaluation and management.  INSOMNIA: You have difficulty falling asleep, possibly related to anxiety. -Consider using hydroxyzine at bedtime for sleep and as needed during the day for anxiety.  OBSTRUCTIVE SLEEP APNEA: You are managing this condition with regular use of a BiPAP machine. -Continue using your BiPAP machine as prescribed.  RESTLESS LEGS SYNDROME: This condition is managed with Mirapex (pramipexole). -Continue taking Mirapex as prescribed.  ASTHMA: Your asthma is managed with Advair and albuterol  as needed. -Continue using Advair for asthma management.  ALLERGIC RHINITIS AND ALLERGIC CONJUNCTIVITIS: You have recent exacerbation of symptoms with itchy eyes and nasal congestion. -Continue using olopatadine , Zyrtec, Singulair , and Allegra . -Consider using  Flonase as needed for nasal congestion.  OSTEOPENIA: This condition was confirmed by a bone density scan. -We will refer you to a bone specialist for further evaluation.  GENERAL HEALTH MAINTENANCE: We discussed the importance of vaccinations, especially due to your diabetes. -We administered your flu shot today. -We encourage you to get a COVID booster, which is available at the [facility] or local pharmacy.

## 2024-10-10 NOTE — Assessment & Plan Note (Signed)
 Lab Results  Component Value Date   CHOL 148 01/24/2024   HDL 41.00 01/24/2024   LDLCALC 79 01/24/2024   TRIG 141.0 01/24/2024   CHOLHDL 4 01/24/2024   Lipids stable on lipitor. Continue same.

## 2024-10-10 NOTE — Progress Notes (Signed)
 Subjective:     Patient ID: [name], male    DOB: November 02, 1961, 63 y.o.   MRN: [id number]  Chief Complaint  Patient presents with   Diabetes    Here for follow up   Hypothyroidism    Diabetes    Discussed the use of AI scribe software for clinical note transcription with the patient, who gave verbal consent to proceed.  History of Present Illness [name] is a 63 year old male with a history of knee surgeries who presents with ongoing knee pain and instability.  He experiences persistent knee pain and instability following knee replacement surgery on July 27, 2024. Instability and 'wobbliness' when walking lead to frequent falls, including a fall resulting in a fractured kneecap. Post-surgery, he attends physical therapy, which is painful, and notes a regression in knee flexion from 101 degrees to 86 degrees. A catching sensation occurs when straightening his leg.  He manages diabetes with Jardiance, metformin, Actos, and Ozempic , with fluctuating blood sugar levels and a recent reading of 222 mg/dL. His last A1c was 6.9. He has Novolog 70/30 insulin  but has not used it. Increased urination, especially at night, is noted. He is reducing soda intake and increasing water consumption.  He experiences seasonal allergies with itchy eyes, using Patanol, Zyrtec, Singulair , and Allegra . He has not used Flonase recently.  He has osteopenia and a recent bone density scan. Diabetic retinopathy is present with recent eye exams showing hemorrhaging. He uses a BiPAP machine and has not needed albuterol  since May.  He takes hydroxyzine and citalopram for anxiety and sleep issues, with difficulty sleeping, anxiety, and episodes of crying. He is considering returning to therapy. He also takes Mirapex for restless leg syndrome.     10/10/2024    2:05 PM 06/06/2024    3:21 PM 01/24/2024   10:03 AM 03/08/2018   11:10 AM 02/05/2017    4:13 PM  Depression screen PHQ 2/9   Decreased Interest 2 0 0 0 0  Down, Depressed, Hopeless 1 0 0 0 0  PHQ - 2 Score 3 0 0 0 0  Altered sleeping 2  0 0 3  Tired, decreased energy 2  0 1 3  Change in appetite 0  0 0 0  Feeling bad or failure about yourself  0  0 0 0  Trouble concentrating 0  0 0 3  Moving slowly or fidgety/restless 0  0 0 0  Suicidal thoughts 0  0 0 0   PHQ-9 Score 7  0 1 9  Difficult doing work/chores   Not difficult at all Not difficult at all      Data saved with a previous flowsheet row definition       Health Maintenance Due  Topic Date Due   Diabetic kidney evaluation - Urine ACR  Never done   COVID-19 Vaccine (4 - 2025-26 season) 08/28/2024    Past Medical History:  Diagnosis Date   Arthritis    Asthma    Cancer of kidney (HCC)    s/p L nephrectomy (renal cell carcinoma)   Carpal tunnel syndrome    Compression fracture of lumbar vertebra (HCC)    Depression    Deviated septum    Diabetes (HCC)    Fatty liver 03/08/2017   Fibromyalgia    Hepatitis A 1980   History of kidney cancer    Removed left kidney    Hypercholesteremia    Hypertension    Kidney disease  Migraine    Morbid obesity (HCC) 01/06/2017   Obesity    S/P revision of total knee, 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   Sinus congestion    Sleep apnea    Thyroid  disease     Past Surgical History:  Procedure Laterality Date   ANKLE SURGERY     BACK SURGERY  2007   COLONOSCOPY WITH ESOPHAGOGASTRODUODENOSCOPY (EGD)  [facility]   HAND SURGERY Right    KNEE SURGERY Left    X2   NASAL SEPTUM SURGERY  02/22/2019   turbinate reduction   NEPHRECTOMY Left 2003   ROTATOR CUFF REPAIR Right     Family History  Problem Relation Age of Onset   Glaucoma Maternal Grandmother    Leukemia Maternal Grandfather    Heart attack Brother    Diabetes Brother    Allergies Brother    Diabetes Brother    Allergies Sister    Asthma Sister    Heart disease Mother    Rheumatologic disease Mother     Lupus Mother    Angioedema Mother    Irritable bowel syndrome Mother    Diverticulosis Mother    Other Mother 44       Colon Resecction    Heart disease Father    Diabetes Father    Liver disease Father    Rheumatologic disease Sister    Cancer Paternal Grandfather        leukemia   Asthma Child    Allergic rhinitis Child    Esophageal cancer Maternal Aunt    Eczema Neg Hx    Urticaria Neg Hx     Social History   Socioeconomic History   Marital status: Married    Spouse name: Not on file   Number of children: 1   Years of education: Not on file   Highest education level: Associate degree: occupational, Scientist, product/process development, or vocational program  Occupational History   Occupation: Retired  Retail banker   Tobacco Use   Smoking status: Some Days    Types: Cigars, Cigarettes    Start date: 1979    Passive exposure: Current   Smokeless tobacco: Never  Vaping Use   Vaping status: Never Used  Substance and Sexual Activity   Alcohol use: Yes    Alcohol/week: 0.0 standard drinks of alcohol    Comment: occasionally   Drug use: Never   Sexual activity: Yes  Other Topics Concern   Not on file  Social History Narrative   1 son born 2004 [place]   Married   On Disability from [place]- (back pain, knee pain, ankle pain, neuropathy)   Completed associates degree   Former Retail banker, 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50   Enjoys reading, Media planner, volunteering   Social Drivers of Corporate investment banker Strain: Medium Risk (06/05/2024)   Overall Financial Resource Strain (CARDIA)    Difficulty of Paying Living Expenses: Somewhat hard  Food Insecurity: No Food Insecurity (07/28/2024)   Hunger Vital Sign    Worried About Running Out of Food in the Last Year: Never true    Ran Out of Food in the Last Year: Never true  Recent Concern: Food Insecurity - Food Insecurity Present (06/05/2024)   Hunger Vital Sign    Worried About Running Out of Food in the Last Year:  Sometimes true    Ran Out of Food in the Last Year: Never true  Transportation Needs: No Transportation  Needs (07/28/2024)   PRAPARE - Administrator, Civil Service (Medical): No    Lack of Transportation (Non-Medical): No  Physical Activity: Insufficiently Active (06/05/2024)   Exercise Vital Sign    Days of Exercise per Week: 2 days    Minutes of Exercise per Session: 60 min  Stress: No Stress Concern Present (06/06/2024)   [employer] of Occupational Health - Occupational Stress Questionnaire    Feeling of Stress : Not at all  Social Connections: Moderately Integrated (06/05/2024)   Social Connection and Isolation Panel    Frequency of Communication with Friends and Family: More than three times a week    Frequency of Social Gatherings with Friends and Family: Once a week    Attends Religious Services: 1 to 4 times per year    Active Member of [employer] or Organizations: No    Attends Engineer, structural: Not on file    Marital Status: Married  Catering manager Violence: Not At Risk (07/28/2024)   Humiliation, Afraid, Rape, and Kick questionnaire    Fear of Current or Ex-Partner: No    Emotionally Abused: No    Physically Abused: No    Sexually Abused: No    Outpatient Medications Prior to Visit  Medication Sig Dispense Refill   albuterol  (VENTOLIN  HFA) 108 (90 Base) MCG/ACT inhaler Inhale 2 puffs into the lungs every 6 (six) hours as needed for wheezing or shortness of breath. 8 g 2   aspirin EC 81 MG tablet Take 81 mg by mouth daily. Swallow whole. (Patient taking differently: Take 162 mg by mouth daily. For 1 more week, then change back to 1 tablet daily)     atorvastatin (LIPITOR) 80 MG tablet Take 1 tablet by mouth daily.     Calcium Carbonate-Vit D-Min (CALTRATE 600+D PLUS MINERALS) 600-800 MG-UNIT TABS One tab twice daily     cetirizine (ZYRTEC) 10 MG tablet Take 1 tablet by mouth at bedtime.     Cholecalciferol 50 MCG (2000 UT) TABS Take 2 tablets by mouth  daily.     ciclopirox (LOPROX) 0.77 % SUSP Apply topically 2 (two) times daily.     citalopram (CELEXA) 40 MG tablet Take 40 mg by mouth daily.     clotrimazole (LOTRIMIN) 1 % external solution Apply 1 Application topically 2 (two) times daily.     cyclobenzaprine (FLEXERIL) 10 MG tablet Take 10 mg by mouth 3 (three) times daily as needed for muscle spasms.     empagliflozin (JARDIANCE) 25 MG TABS tablet Take 25 mg by mouth daily.     EPINEPHrine  0.3 mg/0.3 mL IJ SOAJ injection Inject into the muscle. (Patient taking differently: Inject 0.3 mg into the muscle as needed.)     fexofenadine  (ALLEGRA ) 180 MG tablet Take 1 tablet by mouth daily.     fluticasone  (FLONASE) 50 MCG/ACT nasal spray Administer 1 spray in each nostril daily as needed.     fluticasone -salmeterol (ADVAIR) 500-50 MCG/ACT AEPB Inhale 1 puff into the lungs in the morning and at bedtime.     gabapentin  (NEURONTIN ) 300 MG capsule Take 2 capsules (600 mg total) by mouth 2 (two) times daily. (Patient taking differently: Take 600 mg by mouth 2 (two) times daily. And 2 capsules at bedtime)     Glucagon 0.5 MG/0.1ML SOAJ Inject into the skin.     hydrochlorothiazide (HYDRODIURIL) 25 MG tablet Take 1 tablet by mouth daily.     hydrOXYzine (ATARAX) 10 MG tablet Take by mouth.  Insulin  Pen Needle 31G X 5 MM MISC Use as directed. 100 each 4   Lactobacillus (ACIDOPHILUS) CAPS capsule Take 2 capsules by mouth daily.     Lanolin Alcohol WAX Apply topically.     levothyroxine  (SYNTHROID ) 75 MCG tablet Take 1 tablet (75 mcg total) by mouth daily. 30 tablet 3   losartan (COZAAR) 100 MG tablet Take 1 tablet by mouth daily.     metFORMIN (GLUCOPHAGE-XR) 500 MG 24 hr tablet Take 1,000 mg by mouth daily with breakfast.     metoCLOPramide (REGLAN) 5 MG tablet Take 2.5 mg by mouth at bedtime as needed for nausea.     montelukast  (SINGULAIR ) 10 MG tablet Take 1 tablet by mouth at bedtime.     Multiple Vitamins-Minerals (MENS 50+ MULTI VITAMIN/MIN  PO) Take 1 tablet by mouth daily.     NON FORMULARY Allergen immunotherapy     olopatadine  (PATANOL) 0.1 % ophthalmic solution Apply to eye.     Omega-3 1000 MG CAPS Take by mouth. (Patient taking differently: Take 1 capsule by mouth daily.)     omeprazole  (PRILOSEC) 20 MG capsule Take 2 capsules (40 mg total) by mouth daily.     ondansetron  (ZOFRAN -ODT) 4 MG disintegrating tablet DISSOLVE ONE TABLET MOUTH THREE TIMES A DAY AS NEEDED AS DIRECTED BY PROVIDER     oxycodone  (OXY-IR) 5 MG capsule Take 5 mg by mouth every 4 (four) hours as needed.     pioglitazone (ACTOS) 30 MG tablet Take by mouth.     pramipexole (MIRAPEX) 0.5 MG tablet Take 0.5 mg by mouth 3 (three) times daily.     promethazine  (PHENERGAN ) 25 MG tablet Take 25 mg by mouth every 6 (six) hours as needed for nausea or vomiting.     Propylene Glycol (SYSTANE COMPLETE) 0.6 % SOLN Apply 1 drop to eye 4 (four) times daily as needed.     Semaglutide , 2 MG/DOSE, (OZEMPIC , 2 MG/DOSE,) 8 MG/3ML SOPN Inject 2 mg into the skin once a week.     Simethicone LIQD      sodium chloride  (OCEAN) 0.65 % nasal spray      tadalafil (CIALIS) 20 MG tablet Take by mouth.     tamsulosin  (FLOMAX ) 0.4 MG CAPS capsule TAKE 1 CAPSULE (0.4 MG TOTAL) BY MOUTH DAILY. 30 capsule 5   terbinafine (LAMISIL) 1 % cream Apply 1 Application topically 2 (two) times daily.     testosterone cypionate (DEPOTESTOSTERONE CYPIONATE) 200 MG/ML injection Inject 200 mg into the muscle every 14 (fourteen) days.     No facility-administered medications prior to visit.    Allergies  Allergen Reactions   Hydrocodone Itching   Niacin And Related Hives    ROS See HPI    Objective:    Physical Exam Constitutional:      General: He is not in acute distress.    Appearance: He is well-developed.  HENT:     Head: Normocephalic and atraumatic.  Cardiovascular:     Rate and Rhythm: Normal rate and regular rhythm.     Heart sounds: No murmur heard. Pulmonary:     Effort:  Pulmonary effort is normal. No respiratory distress.     Breath sounds: Normal breath sounds. No wheezing or rales.  Skin:    General: Skin is warm and dry.  Neurological:     Mental Status: He is alert and oriented to person, place, and time.  Psychiatric:        Behavior: Behavior normal.  Thought Content: Thought content normal.      BP 102/74 (BP Location: Right Arm, Patient Position: Sitting, Cuff Size: Normal)   Pulse 95   Temp 98.4 F (36.9 C) (Oral)   Resp 16   Ht 5' 4 (1.626 m)   Wt 207 lb (93.9 kg)   SpO2 97%   BMI 35.53 kg/m  Wt Readings from Last 3 Encounters:  10/10/24 207 lb (93.9 kg)  08/09/24 199 lb (90.3 kg)  08/04/24 201 lb (91.2 kg)       Assessment & Plan:   Problem List Items Addressed This Visit       Unprioritized   Type 2 diabetes mellitus with neurological complications (HCC)   Lab Results  Component Value Date   HGBA1C 7.3 (H) 06/20/2024   HGBA1C 9.7 (H) 01/24/2024   HGBA1C 8.1 (H) 02/14/2019   Lab Results  Component Value Date   LDLCALC 79 01/24/2024   CREATININE 1.17 01/24/2024   Update A1C.  Maintained on jardiance, actos, metformin and Ozempic .        Relevant Orders   HgB A1c   Basic Metabolic Panel (BMET)   Urine Microalbumin w/creat. ratio   Solitary kidney, acquired   Lab Results  Component Value Date   CREATININE 1.17 01/24/2024   Last Cr is normal.      Seasonal and perennial allergic rhinoconjunctivitis   Notes increased allergies recently due to seasonal triggers.  Using zyrtec/allegra , singulair , patanol, prn flonase.       S/P total knee arthroplasty   Will follow up with his ortho- continuing PT of his left knee.      RLS (restless legs syndrome)   Stable on mirapex.       Osteopenia   05/08/24- osteopenia dexa performed at the [facility].       Obstructive sleep apnea with use of bilevel positive airway pressure (BPAP)   Continues bipap nightly.      Moderate persistent asthma without  complication   Stable- has not needed albuterol  since 5/25.        Insomnia   Uncontrolled. Recommend that he restart HS hydroxyzine which is being presribed by the [place].        Hypothyroidism   Lab Results  Component Value Date   TSH 1.33 06/20/2024   Update synthroid .        Relevant Orders   TSH   Hypercholesterolemia   Lab Results  Component Value Date   CHOL 148 01/24/2024   HDL 41.00 01/24/2024   LDLCALC 79 01/24/2024   TRIG 141.0 01/24/2024   CHOLHDL 4 01/24/2024   Lipids stable on lipitor. Continue same.       Essential hypertension   BP Readings from Last 3 Encounters:  10/10/24 102/74  08/04/24 135/76  06/20/24 120/79   BP stable on hydrochlorothiazide and losartan.       Diabetic retinopathy with macular edema associated with type 2 diabetes mellitus (HCC)   This is being followed [facility] the [facility].       Depression   Fair control on citalopram.  Recommended that he start counseling at the [place].       Anxiety   Having increased anxiety.  Continues citalopram.  Recommended hydroxyzine prn.  He is not working with a Paramedic.  Recommended he consider through the [place].       Other Visit Diagnoses       Needs flu shot    -  Primary   Relevant Orders  Flu vaccine trivalent PF, 6mos and older(Flulaval,Afluria,Fluarix,Fluzone) (Completed)       I am having [name]. Zenk Nando maintain his citalopram, empagliflozin, testosterone cypionate, tamsulosin , levothyroxine , NON FORMULARY, pramipexole, albuterol , atorvastatin, cetirizine, Cholecalciferol, EPINEPHrine , fluticasone , Glucagon, Acidophilus, losartan, fexofenadine , montelukast , Omega-3, ondansetron , sodium chloride , tadalafil, Simethicone, pioglitazone, hydrochlorothiazide, olopatadine , hydrOXYzine, Insulin  Pen Needle, Caltrate 600+D Plus Minerals, omeprazole , gabapentin , oxycodone , aspirin EC, metoCLOPramide, Systane Complete, Ozempic  (2 MG/DOSE), promethazine , Multiple  Vitamins-Minerals (MENS 50+ MULTI VITAMIN/MIN PO), metFORMIN, fluticasone -salmeterol, clotrimazole, terbinafine, Lanolin Alcohol, ciclopirox, and cyclobenzaprine.  No orders of the defined types were placed in this encounter.

## 2024-10-10 NOTE — Assessment & Plan Note (Signed)
 Uncontrolled. Recommend that he restart HS hydroxyzine which is being presribed by the [place].

## 2024-10-10 NOTE — Assessment & Plan Note (Signed)
 Continues bipap nightly.

## 2024-10-11 LAB — BASIC METABOLIC PANEL WITH GFR
BUN: 29 mg/dL — ABNORMAL HIGH (ref 6–23)
CO2: 25 meq/L (ref 19–32)
Calcium: 9.2 mg/dL (ref 8.4–10.5)
Chloride: 101 meq/L (ref 96–112)
Creatinine, Ser: 1.42 mg/dL (ref 0.40–1.50)
GFR: 53.15 mL/min — ABNORMAL LOW (ref >60.00)
Glucose, Bld: 145 mg/dL — ABNORMAL HIGH (ref 70–99)
Potassium: 4 meq/L (ref 3.5–5.1)
Sodium: 137 meq/L (ref 135–145)

## 2024-10-11 LAB — TSH: TSH: 1.66 u[IU]/mL (ref 0.35–5.50)

## 2024-10-11 LAB — MICROALBUMIN / CREATININE URINE RATIO
Creatinine,U: 71.6 mg/dL
Microalb Creat Ratio: 465.7 mg/g — ABNORMAL HIGH (ref 0.0–30.0)
Microalb, Ur: 33.4 mg/dL — ABNORMAL HIGH (ref 0.0–1.9)

## 2024-10-11 LAB — HEMOGLOBIN A1C: Hgb A1c MFr Bld: 7.7 % — ABNORMAL HIGH (ref 4.6–6.5)

## 2024-10-12 ENCOUNTER — Ambulatory Visit: Payer: Self-pay | Admitting: Family

## 2024-11-17 NOTE — Progress Notes (Signed)
 VIALS MADE ON 11/17/24

## 2024-12-22 ENCOUNTER — Encounter: Admitting: Family

## 2025-01-16 NOTE — Telephone Encounter (Signed)
 Needs to restart from the beginning or rush

## 2025-04-10 ENCOUNTER — Ambulatory Visit: Admitting: Family

## 2025-06-08 ENCOUNTER — Ambulatory Visit
